# Patient Record
Sex: Male | Born: 1935 | Race: White | Hispanic: No | Marital: Married | Smoking: Never smoker
Health system: Southern US, Community
[De-identification: ages and names within clinical notes are randomized; demographics above are authoritative.]

---

## 2003-04-17 ENCOUNTER — Inpatient Hospital Stay (HOSPITAL_COMMUNITY): Admission: EM | Admit: 2003-04-17 | Discharge: 2003-04-21 | Payer: Self-pay | Admitting: Emergency Medicine

## 2004-03-06 ENCOUNTER — Ambulatory Visit (HOSPITAL_COMMUNITY): Admission: RE | Admit: 2004-03-06 | Discharge: 2004-03-06 | Payer: Self-pay | Admitting: Ophthalmology

## 2005-01-17 ENCOUNTER — Encounter: Admission: RE | Admit: 2005-01-17 | Discharge: 2005-01-17 | Payer: Self-pay | Admitting: Internal Medicine

## 2005-01-18 ENCOUNTER — Encounter (INDEPENDENT_AMBULATORY_CARE_PROVIDER_SITE_OTHER): Payer: Self-pay | Admitting: Specialist

## 2005-01-18 ENCOUNTER — Ambulatory Visit (HOSPITAL_COMMUNITY): Admission: RE | Admit: 2005-01-18 | Discharge: 2005-01-19 | Payer: Self-pay | Admitting: Cardiology

## 2005-01-31 ENCOUNTER — Emergency Department (HOSPITAL_COMMUNITY): Admission: EM | Admit: 2005-01-31 | Discharge: 2005-01-31 | Payer: Self-pay | Admitting: Emergency Medicine

## 2005-11-18 ENCOUNTER — Emergency Department (HOSPITAL_COMMUNITY): Admission: EM | Admit: 2005-11-18 | Discharge: 2005-11-18 | Payer: Self-pay | Admitting: Emergency Medicine

## 2006-05-18 IMAGING — CT CT ANGIO CHEST
2 series · 19 of 32 positions shown · IV contrast (APPLIED)
Comparison: none

CLINICAL DATA: Chest pain.
 CT ANGIOGRAPHY OF CHEST:
TECHNIQUE: Multidetector CT imaging of the chest was performed during bolus injection of intravenous contrast.  Multiplanar CT angiographic image reconstructions were generated to evaluate the vascular anatomy.
 Contrast:  90 cc Omnipaque 300

[Series 4: pulm embolism 2.0 st · axial · 0.69mm/px · z∈[-301,-21]mm · 15 of 162 slices shown]
[im 11/162  lung]
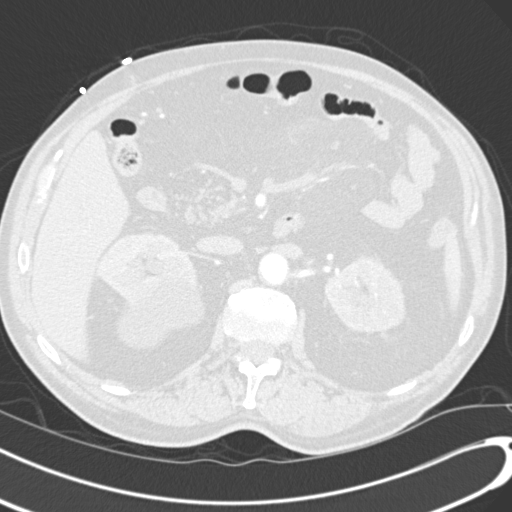
[im 21/162  soft-tissue]
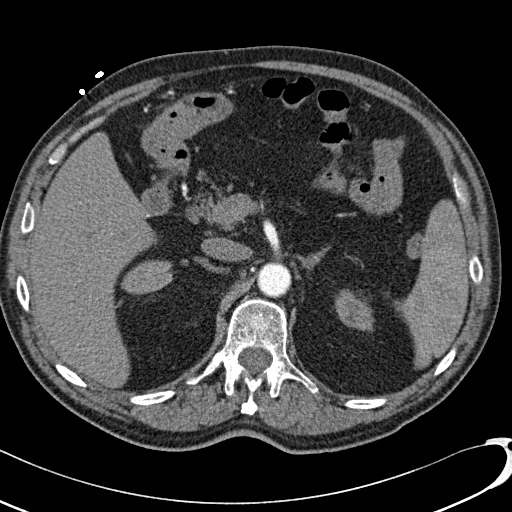
[im 31/162  lung]
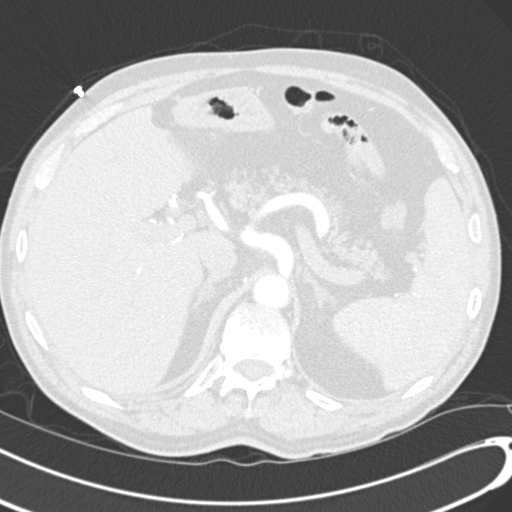
[im 41/162  soft-tissue]
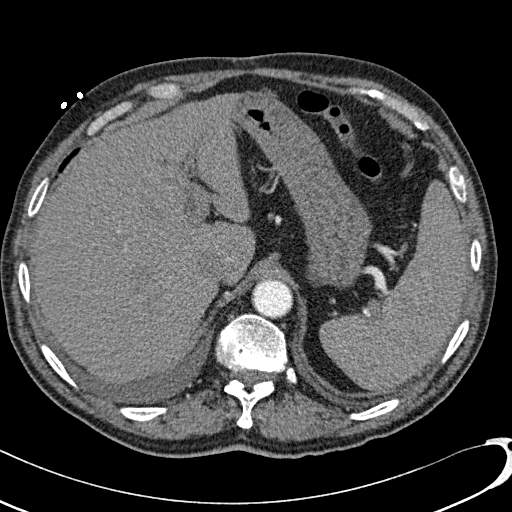
[im 51/162  lung]
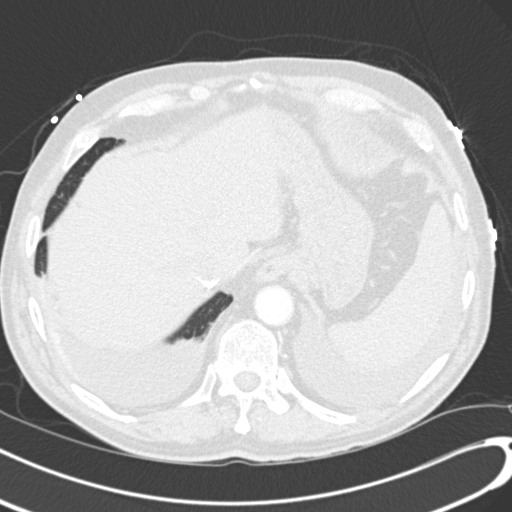
[im 61/162  soft-tissue]
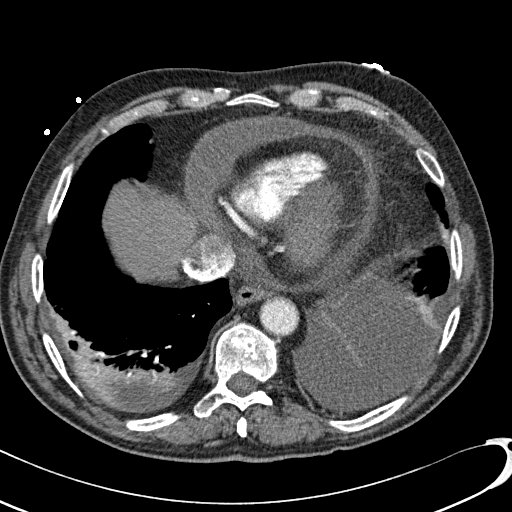
[im 71/162  lung]
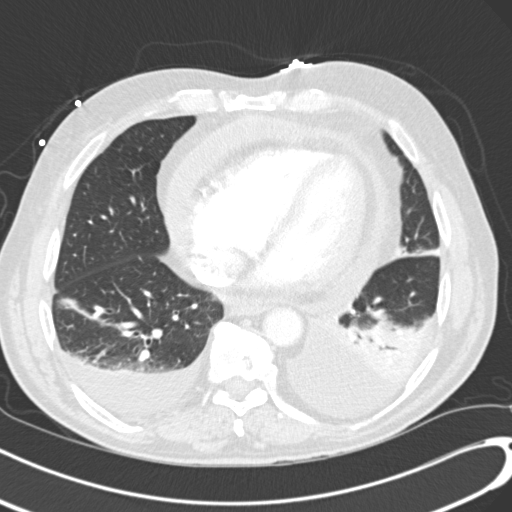
[im 81/162  soft-tissue]
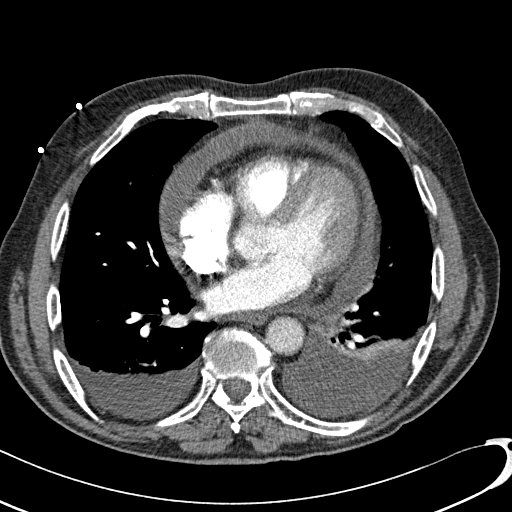
[im 91/162  lung]
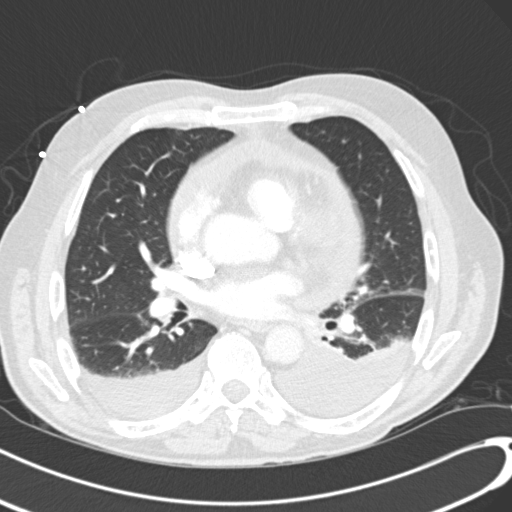
[im 101/162  soft-tissue]
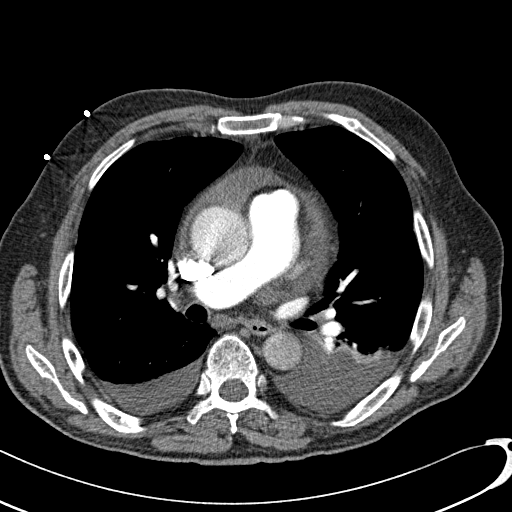
[im 111/162  lung]
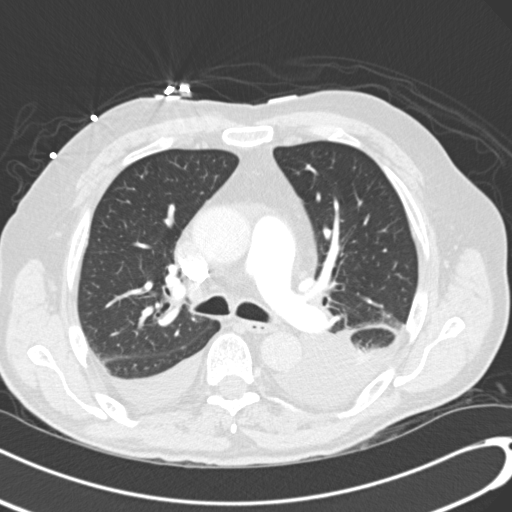
[im 121/162  soft-tissue]
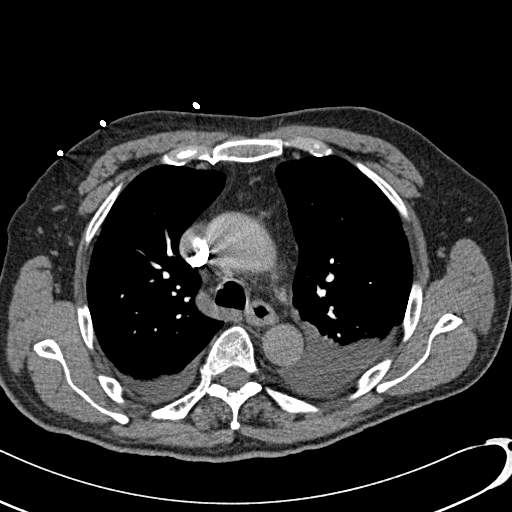
[im 131/162  lung]
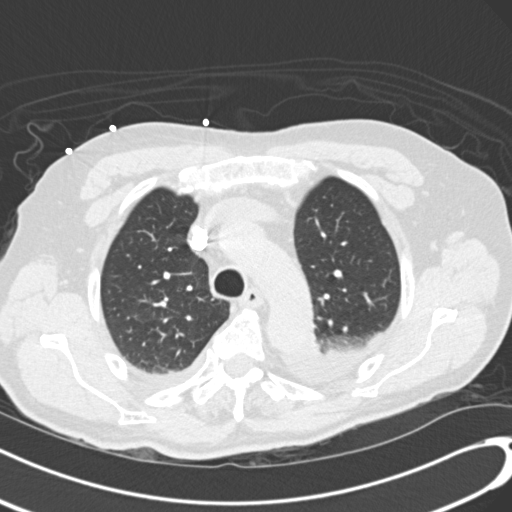
[im 141/162  soft-tissue]
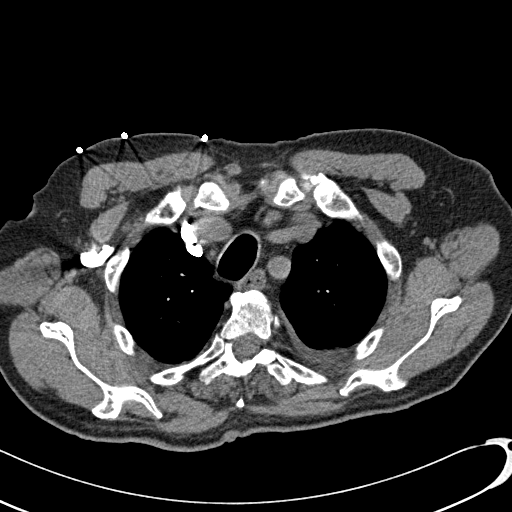
[im 151/162  lung]
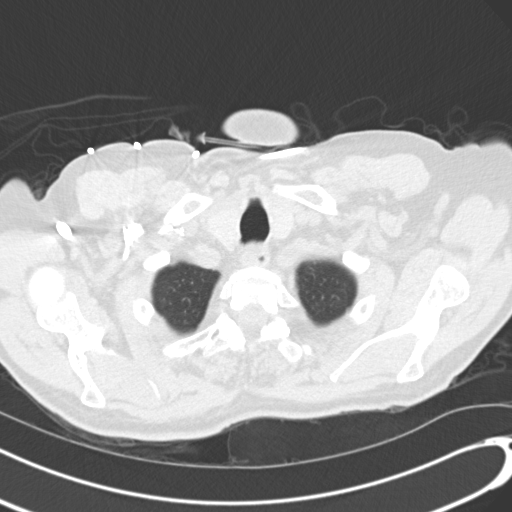

[Series 5: pulm embolism 2.0 lung · axial · 0.69mm/px · z∈[-255,-157]mm · 4 of 138 slices shown]
[im 10/138  soft-tissue]
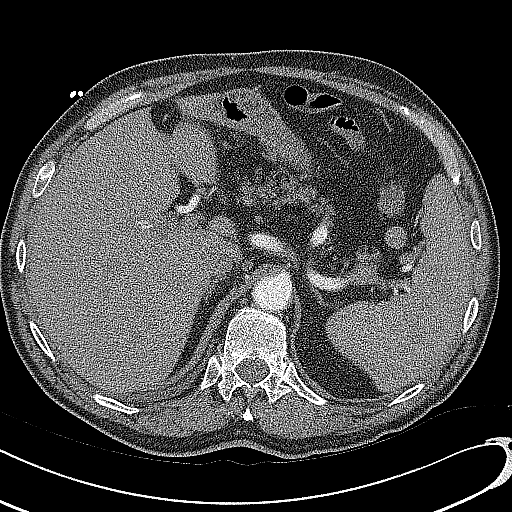
[im 30/138  soft-tissue]
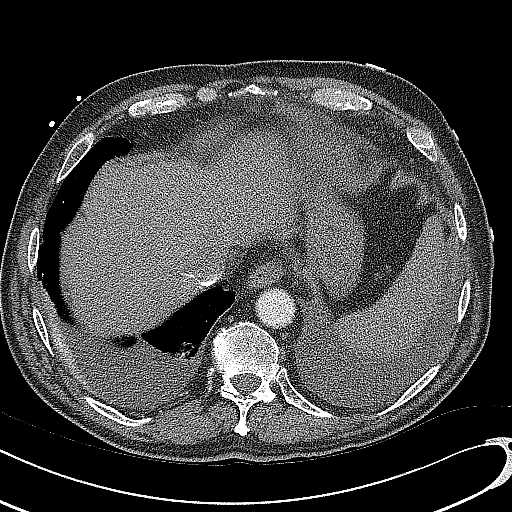
[im 49/138  soft-tissue]
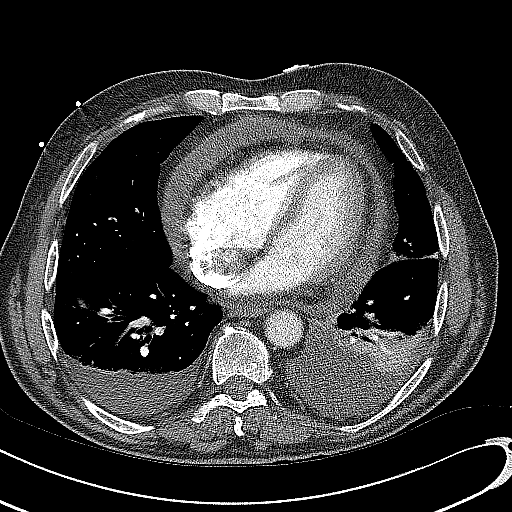
[im 59/138  soft-tissue]
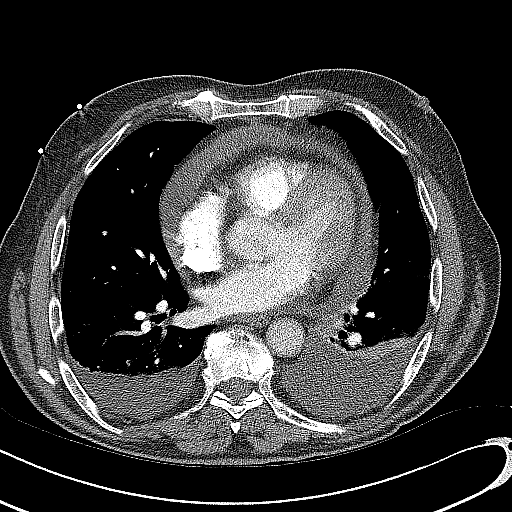

[19 of 32 positions shown; findings below may reference images not displayed]

FINDINGS: No CT evidence of pulmonary embolus.  Main pulmonary artery is slightly enlarged at 3.3 cm. No pathologically enlarged mediastinal, hilar, or axillary lymph nodes.  Esophagus is dilated proximally and is somewhat thick-walled.  Heart size is within normal limits, although there is a large pericardial effusion with enhancement.  Coronary artery calcification is noted.
 There are moderate sized bilateral pleural effusions, left slightly greater than right.  Associated compressive atelectasis is noted.  Lungs are otherwise clear without evidence of edema or pneumonia.  Airway is unremarkable.
 Incidental imaging of the upper abdomen reveals several low attenuation lesions/masses, which are incompletely imaged.  The largest visualized mass on the right contains a focus of high attenuation, possibly representing calcium.  Patient is status post cholecystectomy.  There is a 1.3 cm nodule in the right adrenal gland.  Nonspecific hazy density is seen in the small bowel mesentery.  There are tiny associated soft tissue nodules.
IMPRESSION: 1.  No CT evidence of pulmonary embolus.
 2.  Large pericardial effusion with pericardial enhancement.  Please correlate clinically in patient with history of chest pain.
 3.  Query pulmonary arterial hypertension.
 4.  Bilateral pleural effusions and bibasilar compressive atelectasis.
 5.  Low attenuation lesion/masses within the visualized portions of both kidneys.  These are incompletely imaged.  One of these is at least minimally complex on the right.  Correlation with ultrasound would be useful in further evaluation, as clinically indicated.
 6.  Right adrenal nodule, likely representing an adenoma, given Hounsfield units of 12 or less.
 7.  Nonspecific haziness and minimal nodularity of small bowel mesentery.

## 2006-05-18 IMAGING — CR DG CHEST 2V
2 series · 2 of 2 positions shown · non-contrast
Comparison: none

CLINICAL DATA: Cough and Shortness of breath.  Chest pain.  
 CHEST - 2 VIEW:
 Small pleural effusions are present bilaterally, left-sided larger than right.  Moderate cardiomegaly is noted but there is no evidence of congestive heart failure.  There is no evidence of pulmonary consolidation.  Mild compressive atelectasis is noted at both lung bases.

[w chest pa]
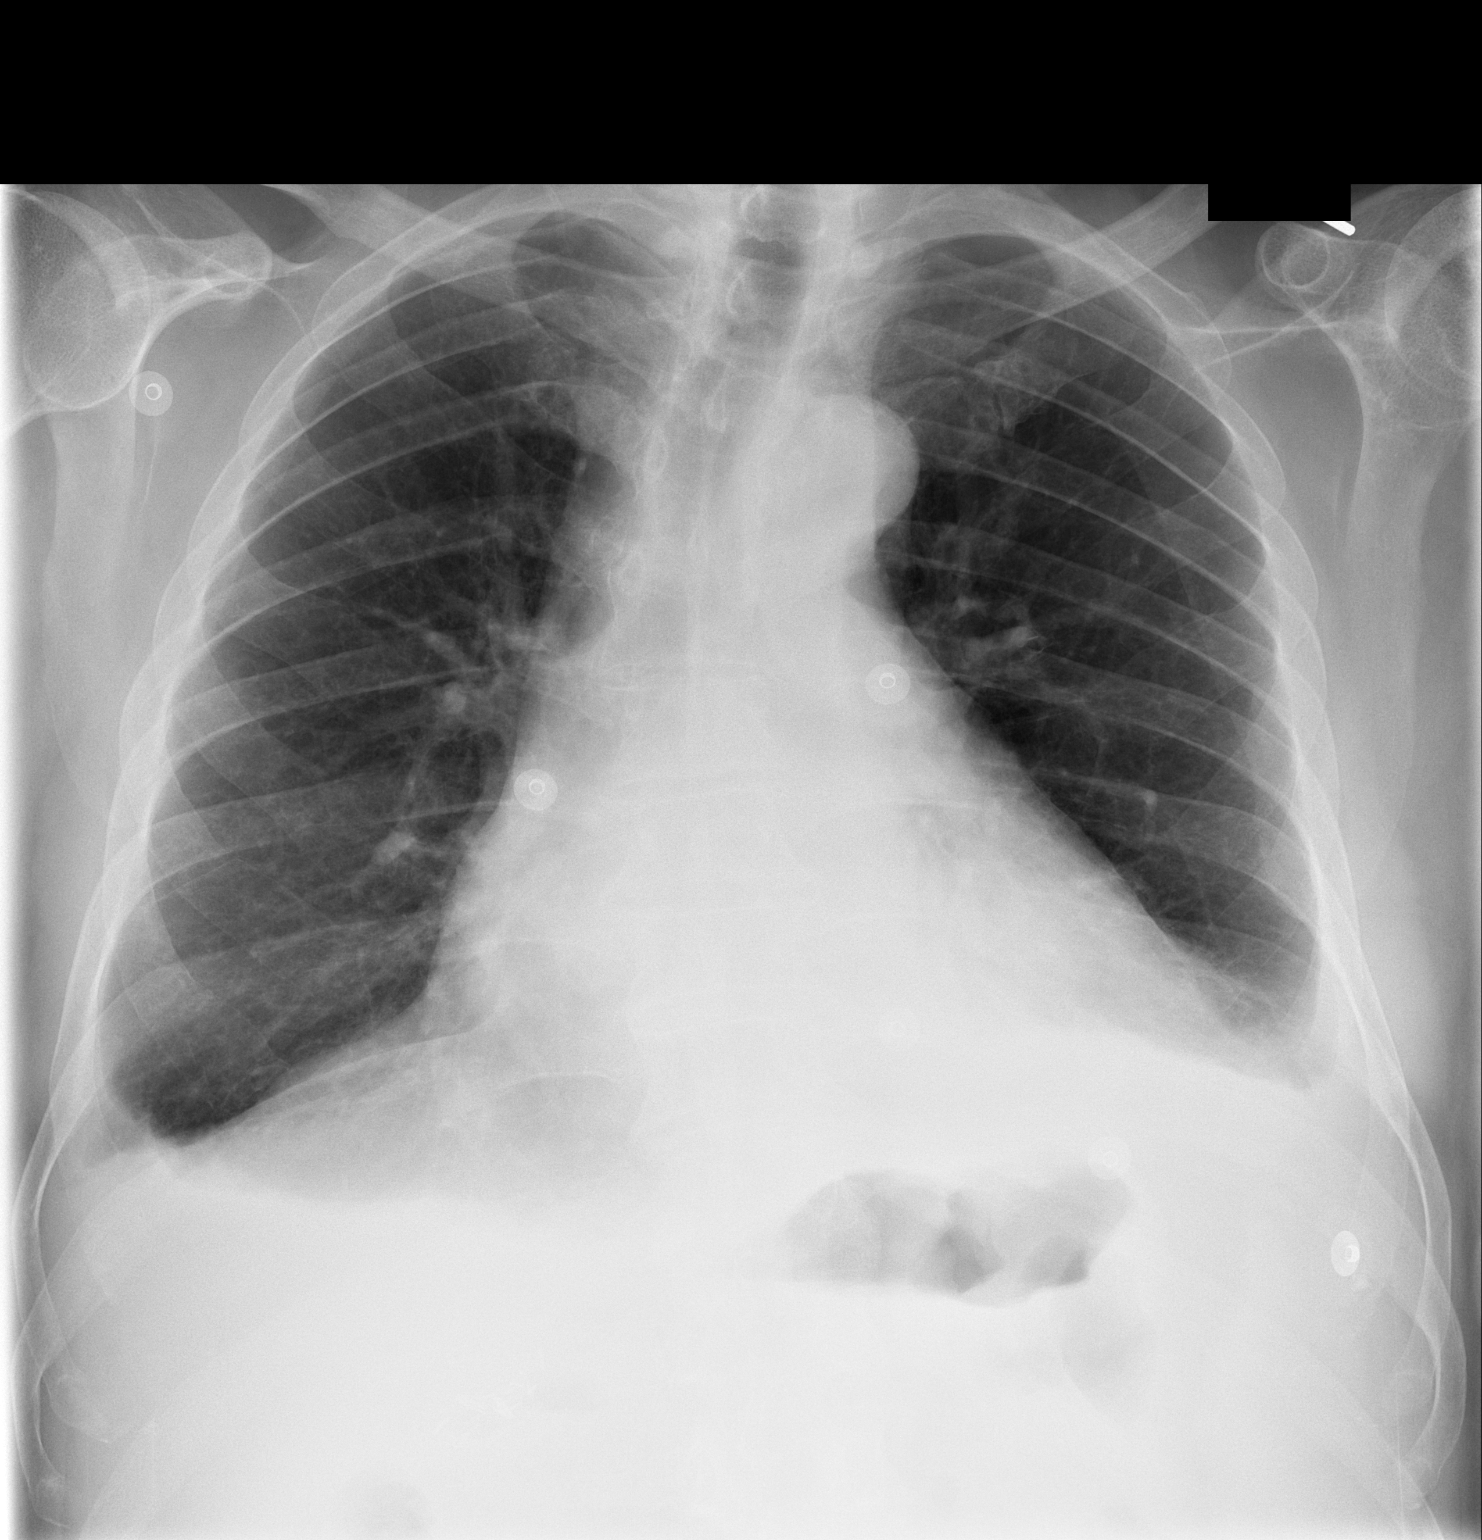

[w chest lat]
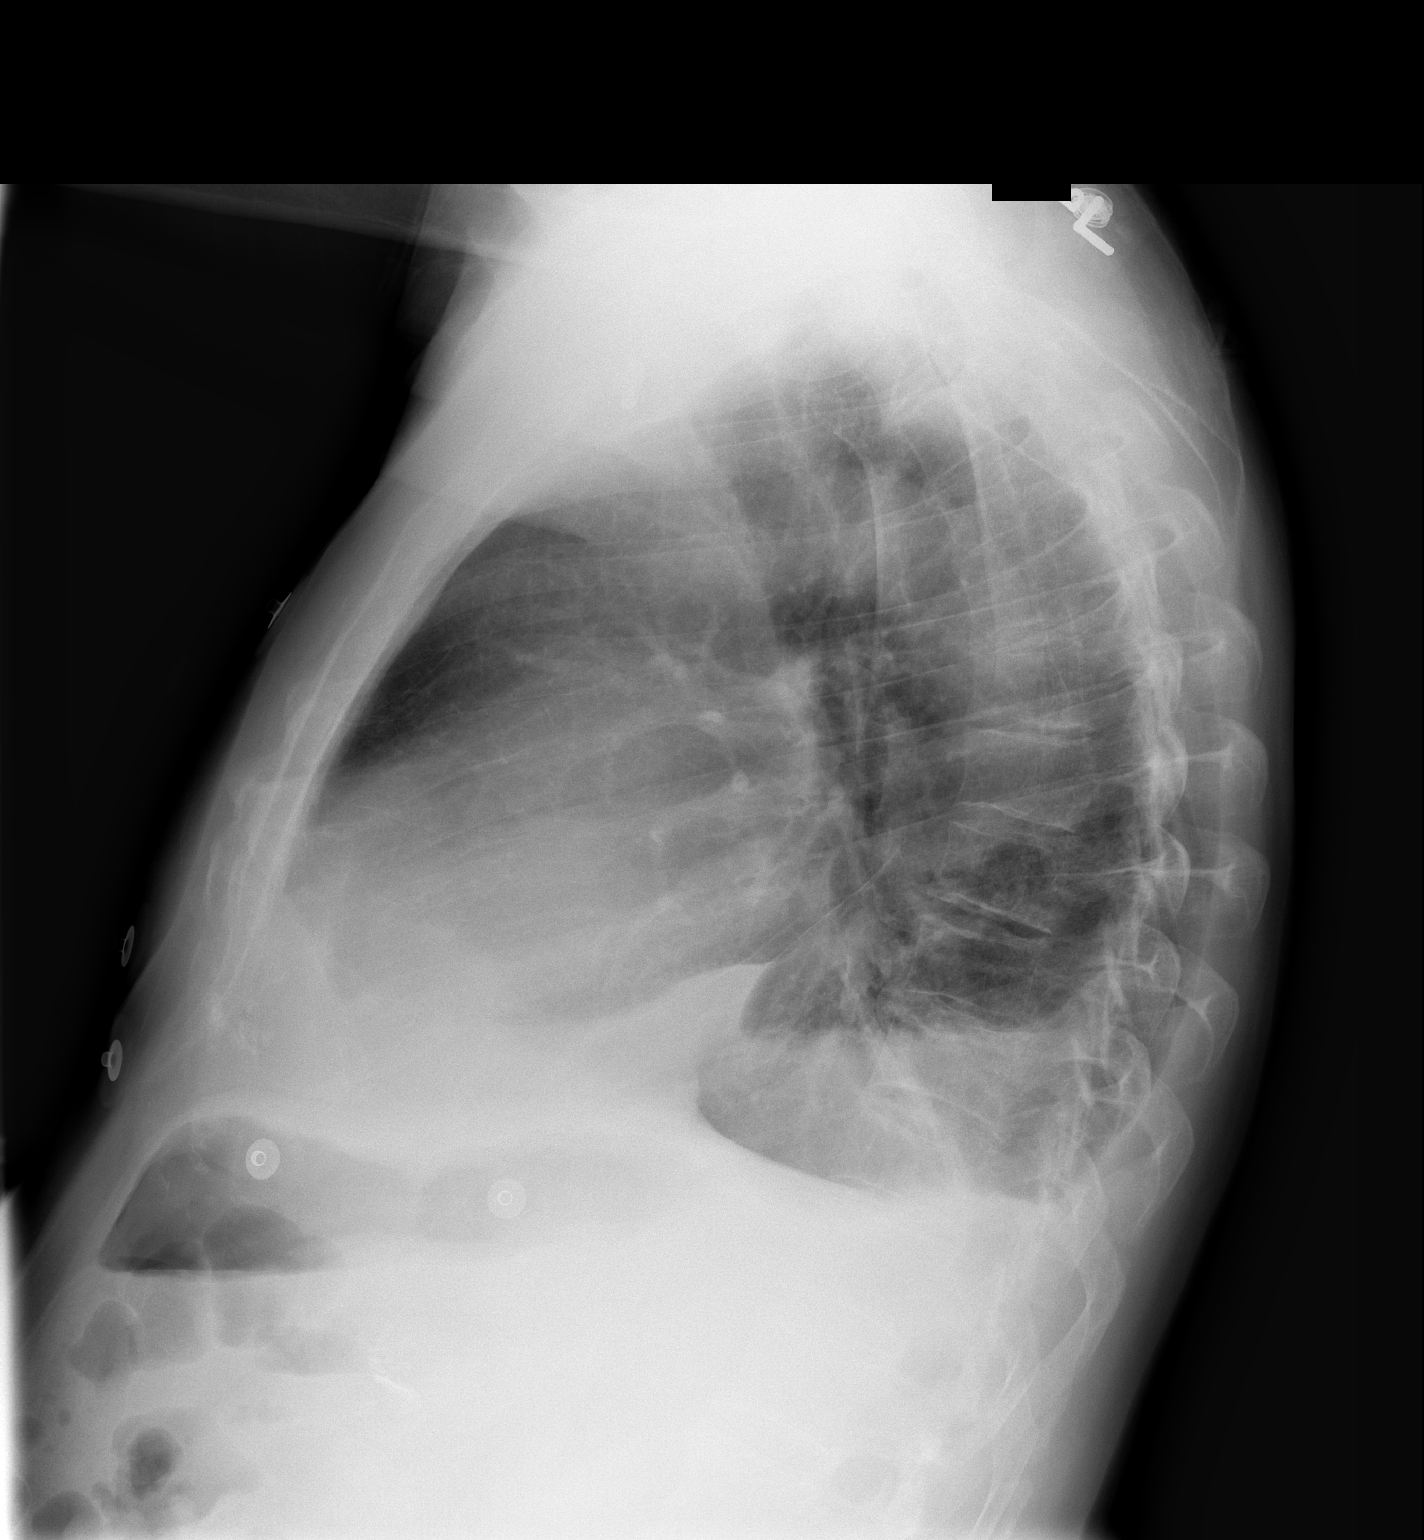

[2 of 2 positions shown; findings below may reference images not displayed]

IMPRESSION: 1.  Small bilateral pleural effusions and mild bibasilar atelectasis. 
 2.  Moderate cardiomegaly.  No evidence of congestive heart failure.

## 2006-12-02 ENCOUNTER — Encounter: Admission: RE | Admit: 2006-12-02 | Discharge: 2006-12-02 | Payer: Self-pay | Admitting: Urology

## 2006-12-04 ENCOUNTER — Ambulatory Visit (HOSPITAL_BASED_OUTPATIENT_CLINIC_OR_DEPARTMENT_OTHER): Admission: RE | Admit: 2006-12-04 | Discharge: 2006-12-04 | Payer: Self-pay | Admitting: Urology

## 2007-04-12 ENCOUNTER — Emergency Department (HOSPITAL_COMMUNITY): Admission: EM | Admit: 2007-04-12 | Discharge: 2007-04-12 | Payer: Self-pay | Admitting: Emergency Medicine

## 2007-04-23 ENCOUNTER — Ambulatory Visit (HOSPITAL_BASED_OUTPATIENT_CLINIC_OR_DEPARTMENT_OTHER): Admission: RE | Admit: 2007-04-23 | Discharge: 2007-04-23 | Payer: Self-pay | Admitting: Urology

## 2008-06-25 ENCOUNTER — Encounter: Admission: RE | Admit: 2008-06-25 | Discharge: 2008-06-25 | Payer: Self-pay | Admitting: Internal Medicine

## 2010-06-18 ENCOUNTER — Encounter: Payer: Self-pay | Admitting: Internal Medicine

## 2010-10-10 NOTE — Op Note (Signed)
NAME:  Anthony Hamilton, Anthony Hamilton         ACCOUNT NO.:  [id number]   MEDICAL RECORD NO.:  [id number]          PATIENT TYPE:  AMB   LOCATION:  [facility]                         FACILITY:  [facility]   PHYSICIAN:  Bernestine Amass, M.D.  DATE OF BIRTH:  15-May-1928   DATE OF PROCEDURE:  12/04/2006  DATE OF DISCHARGE:                               OPERATIVE REPORT   PREOPERATIVE DIAGNOSIS:  Bladder calculus, 3 cm.   POSTOPERATIVE DIAGNOSIS:  Bladder calculus, 3 cm.   PROCEDURE PERFORMED:  Cystoscopy with Holmium laser lithotripsy of  bladder calculus.   SURGEON:  Bernestine Amass, M.D.   ANESTHESIA:  General.   INDICATIONS:  [name] was seen recently because of recurrent  gross hematuria.  He has been known to have nephrolithiasis in the past  and has also had some mild to moderate bladder neck obstruction.  During  his recent assessment, he was noted to have ongoing hematuria without  evidence of infection.  Renal ultrasound showed no hydronephrosis.  The  patient does have a small stone in the lower pole of his left kidney.  Cystoscopy, however, revealed a stone within the bladder.  Initially in  the office, we felt this was about a 2 cm stone.  We felt that this  should be treated and was likely to be the cause for his gross  hematuria.  The patient understood the advantages and disadvantages of  stone treatment of this nature and full informed consent was obtained.   TECHNIQUE AND FINDINGS:  The patient was brought to the operating room  where he had successful induction of general anesthesia.  He was placed  in lithotomy position, prepped and draped in the usual manner.  Cystoscopy revealed unremarkable anterior urethra.  The patient had  fairly significant trilobar hyperplasia with a high riding median bar  and a small middle lobe.  In the base of his bladder, there was a stone  that was larger than we expected.  He had a small 0.5 cm stone and about  a 3 to 3.5 cm stone see at his  trigone.  A 500 micron Holmium laser  lithotriptor fiber was then used.  The stone was quite dense.  We were  able to completely fracture the stone.  All pieces were then evacuated.  The patient had mild hematuria due to some friable vessels at his  bladder neck.  His urine was light pink at the completion of the  procedure.  No other obvious complications occurred.  The patient was  brought to recovery room in stable condition.           ______________________________  Bernestine Amass, M.D.  Electronically Signed     DSG/MEDQ  D:  12/04/2006  T:  12/04/2006  Job:  [id number]

## 2010-10-10 NOTE — Op Note (Signed)
[name], [name]         ACCOUNT NO.:  [id number]   MEDICAL RECORD NO.:  [id number]          PATIENT TYPE:  AMB   LOCATION:  [facility]                         [facility]ACILITY:  [facility]   PHYSICIAN:  Bernestine Amass, M.D.  DATE OF BIRTH:  [birth year]   DATE OF PROCEDURE:  04/23/2007  DATE OF DISCHARGE:                               OPERATIVE REPORT   ATTENDING:  Dr. Rana Snare.   ASSISTANT:  Dr. Posey Pronto.   PREOPERATIVE DIAGNOSIS:  Bilateral uric acid stones, left-sided flank  pain.   POSTOPERATIVE DIAGNOSIS:  Bilateral uric acid stones, left-sided flank  pain.   PROCEDURE:  1. Pancystourethroscopy.  2. Left-sided retrograde pyelogram.  3. Left-sided ureteroscopy with laser lithotripsy.  4. Left-sided stent placement.   INDICATIONS:  Anthony Hamilton is a 77 year old gentleman who  presented to clinic for evaluation. He was found to have bilateral  stones that were uric acid. He was placed on urine alkalinization, but  he continued to have left-sided pain and is here today electively for  management.   PROCEDURE IN DETAIL:  The patient was brought back into the operating  room. He was placed in the dorsal lithotomy position after the  successful induction of general endotracheal anesthetic. All pressure  points were padded appropriately. He was prepped and draped in the usual  sterile fashion and received preoperative antibiotics.   Using a 22-French sheath with a 30-degree lens, pancystourethroscopy was  performed. The patient's urethra appeared normal. There was no evidence  of stricture, tumor, foreign body or any other abnormality. He did have  some trilobar prostatomegaly. On entering the patient's bladder, both  ureteral orifices were seen at the trigone which were slightly distorted  due to his large median lobe. Both ureteral orifices were effluxing  clear urine. The rest of his bladder wall showed evidence of hypertrophy  and some diverticula. He did have some uric  acid stones in the bladder  that were small volume.   At this point, we used an acorn-tip catheter to cannulate the distal  portion of the left ureteral orifice. Retrograde contrast was injected  to opacify the ureter in the upper collecting system. Multiple filling  defects were seen throughout the ureter and the upper collecting system  that were mobile consisting with his history of stone. There was no  evidence of stricture or other filling defects. At this point, a  Glidewire was placed into the left ureter. On attempted placement of the  Glidewire, the back wall of the ureter was performed into the bladder  with a Glidewire. On recognizing this, the Glidewire was removed and  replaced and successfully placed in the upper part of the ureter and  kidney. This wire was secured, and then we used a double lumen catheter  to place another wire up into the upper collecting system under  fluoroscopic guidance.   At this point, we used one wire which as a safety wire which was secured  to the patient's draping. The other wire was used to place an access  sheath up into the upper ureter. This was done under direct vision  fluoroscopically.   We then  used the flexible ureteroscope to cannulate the access sheath  and identified some stones in the ureter. Visualization was difficult  due to blood from wire placement. We did identify some stones in the  ureter and lasered them until the fragments were small. We then advanced  the scope into the renal collecting system and visually inspected all  calices. Again, here visualization was difficult due to clot that was  present. However, we were able to identify some stones and were able to  fragment them successfully. At this point, with most of the large stone  burden fragmented, we removed the flexible scope up to the level of the  access sheath and back the access sheath out under direct vision  ensuring no stones were left in. No large  fragments of stones could be  identified in the ureter.   Removed the access sheath, the working wire and the ureteroscope from  the patient's body. We then used the cystoscope to place a stent in the  left collecting system. Good proximal and distal curls were identified  fluoroscopically. The bladder was then irrigated, and stones were  removed. At this point, we ended the procedure.   Dr. Risa Hamilton was present throughout the entirety of the case.   SPECIMENS:  None.   URINE OUTPUT:  Unrecorded.   DRAINS:  Left double J 8 x 24 ureteral stent.   DISPOSITION:  The patient moved to the PACU for further care. He will go  home with an increase in his Urocit-K to 3 tablets b.i.d., ciprofloxacin  for 3 days, Pyridium and a refill of his oxycodone. We will look forward  to seeing him in the clinic.      [name], MD      Bernestine Amass, M.D.  Electronically Signed    JH/MEDQ  D:  04/23/2007  T:  04/23/2007  Job:  [id number]

## 2010-10-13 NOTE — Op Note (Signed)
NAMEMAYSIN, MOEHRING                     ACCOUNT NO.:  [id number]   MEDICAL RECORD NO.:  [id number]                   PATIENT TYPE:  INP   LOCATION:  0451                                 FACILITY:  [facility]   PHYSICIAN:  Lauretta Grill, M.D.                 DATE OF BIRTH:  17-Dec-1927   DATE OF PROCEDURE:  04/18/2003  DATE OF DISCHARGE:                                 OPERATIVE REPORT   PREOPERATIVE DIAGNOSIS:  Right ankle Pott's-type bimalleolar fracture with  deltoid ligament tear and distal fibular fracture.   POSTOPERATIVE DIAGNOSIS:  Right ankle Pott's-type bimalleolar fracture with  deltoid ligament tear and distal fibular fracture.   PROCEDURE:  1. Open reduction, internal fixation of Pott's-type bimalleolar fracture     with open fixation of medial and deltoid ligament with Mitek anchors and     lateral screw and plate.  2. Intraoperative fluoroscopy.   SURGEON:  Lynden Ang, M.D.   ASSISTANT:  Thomasenia Bottoms, P.A.-C.   ANESTHESIA:  General endotracheal.   CULTURES:  None.   DRAINS:  None.   ESTIMATED BLOOD LOSS:  Minimal.   TOURNIQUET TIME:  1 hour and 30 minutes.   PATHOLOGIC FINDINGS AND HISTORY:  Mr. Flach, yesterday, slipped over a  dog bed, sustaining this closed fracture.  He was admitted and splinted.  His medial malleolus was intact, but his deltoid was torn off the talar  side, and the anterior deltoid ligament was completely separated.  He had a  low distal fibular fracture.  There was no evidence of syndesmosis  disruption at final closure with stressing.  We did find that the deltoid  repaired nicely with two Mitek anchors into the talus, then through and  through pants-over-vest, the anterior medial deltoid.  Laterally, anatomic  reduction is obtained with two anterior-posterior interfragmentary lag  screws and a 7-hold DCP plate with the last two holes as buttress with  cortical screws with 8 good cortices proximal.  C-arm fluoroscopy  confirmed  excellent position and alignment.   DESCRIPTION OF PROCEDURE:  With adequate anesthesia obtained using  endotracheal technique, 1 g Ancef given IV prophylaxis, the patient was  placed in the supine position, and the right foot was prepped from the toes  to the knee in a standard fashion.  After standard prepping and draping,  Esmarch exsanguination was used.  The tourniquet was let up to 350 mmHg.  We  first then made a skin incision longitudinal over the medial malleolus,  incision deepened and sharpened with the knife and hemostasis obtained using  the Bovie electrocoagulator.  Hematoma was evacuated and the ligament tear  assessed.  We then thoroughly irrigated the medial joint and flushed it out  with saline.  I then placed two Mitek super anchors in the medial talus  where the medial malleolus had been evulsed.  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  This has  been evaluated preoperatively by CT scan.  This was due to unusual  appearance of the talus and calcaneus in relationship to one another.  In  any case, we got an excellent repair of the anteromedial deltoid which was  ripped off mostly the talus side.  Using pants-over-vest, the proximal  ligament as well as the tuft that it had been pulled off from, and sutured  through in the anterior medial capsule to reef that up.  Two #2 Ethibond  sutures were used in the Mitek anchors, and the talus was held reduced to  the medial malleolus while it was closed down with these horizontal  anchoring #2 Ethibond sutures.  We then reinforced with #1 Vicryl  interrupted figure-of-eights.  We then turned our attention laterally while  a longitudinal incision was made over the distal fibula.  Incision deepened  sharply with the knife and hemostasis obtained using the Bovie  electrocoagulator.  Dissection was carried down to the fracture which was  evacuated of clot and then  reduced with the bone-holding clamp, small  fragment type.  We then placed two oblique anterior-posterior inner  fragmentary lag screws with classic technique.  I then affixed a 7-hole DCP  plate, small fragment, slightly contoured, and placed one screw hole  unfilled over the fracture site, two distal screw holes unicortical with  cancellous screws with a good bite and a good cortices proximally with four  screws.  C-arm fluoroscopy confirmed positioning on mortise AP and lateral  views.  Irrigation was carried out.  The wound was then closed in layers  with subcutaneous 2-0 and 3-0 Vicryl and skin staples.  A bulky, sterile  compressive dressing was applied with a Cam walker boot in neutral position,  and the tourniquet was let down.  The patient, then having tolerated the  procedure well was awakened and taken to the recovery room in satisfactory  condition for routine postoperative care and elevation.                                               Lauretta Grill, M.D.    VEP/MEDQ  D:  04/18/2003  T:  04/18/2003  Job:  [id number]

## 2010-10-13 NOTE — Discharge Summary (Signed)
NAMEALEXIEL, SIDDALL                     ACCOUNT NO.:  [id number]   MEDICAL RECORD NO.:  [id number]                   PATIENT TYPE:  INP   LOCATION:  0451                                 [facility]ACILITY:  [facility]   PHYSICIAN:  Lauretta Grill, M.D.                 DATE OF BIRTH:  [birth year]   DATE OF ADMISSION:  04/17/2003  DATE OF DISCHARGE:  04/21/2003                                 DISCHARGE SUMMARY   ADMISSION DIAGNOSIS:  Bimalleolar fracture right ankle.   DISCHARGE DIAGNOSIS:  Bimalleolar fracture right ankle status post open  reduction internal fixation.   SUMMARY:  The patient presented to [facility] Emergency Room on April 17, 2003 via EMS.  He suffered a fall, tripped at home, and was found to  have a bimalleolar Potts type right ankle fracture.  He was admitted  overnight for observation and pain control.  He was made n.p.o.  He was  taken to the operating room on the morning of November 21.  His ankle had an  open reduction internal fixation with repair of medial deltoid ligament  under general anesthesia with a tourniquet time of one hour and 30 minutes.  Minimal blood loss.  The wound was closed without drains and he was taken to  recovery room in stable condition.  He was planned to be admitted for three  to four days postoperative for elevation and control of swelling.  On  April 19, 2003 postoperative day one he had a moderate of pain that was  well controlled with p.o. medications and occasional IV Dilaudid.  He was  taking p.o. without difficulty and his Foley was still in place.  His  platelets were stable.  He was afebrile.  He had a maximum temperature of  100.3 but it was down to 97.7 in the morning.  Dressing was clean and dry  and he was neurovascularly intact.  Postoperative laboratories showed a  hemoglobin 11.7, hematocrit 33.8 down from 41.4 preoperatively.  Serum  chemistries normal except for a low sodium of 131, elevated glucose of 126,  and a low  serum calcium of 7.7.  We are going to continue him on elevation,  pain medications as needed, ice to the ankle, discontinuing the Foley,  discontinuing the IV, and starting physical therapy on the following day.  We are anticipating discharging him home in a couple of days.  He was out of  bed to chair at that time.  PT, OT, care management were consulted.  On  November 23, postoperative day two he was without complaints.  His pain was  well controlled.  Taking p.o. without difficulty.  His vitals were stable.  He was afebrile.  The dressing was clean and dry.  He was neurovascularly  intact.  He still had a moderate amount of swelling over the ankle and  forefoot and capillary refill less than two seconds in all toes.  His home  DME was ordered as per PT and OT.  On postoperative day three, November 24,  his pain was well controlled, taking p.o., and voiding without difficulty.  His Foley had been removed.  He had no nausea and states he was ready to go  home.  His vital signs were stable.  He was afebrile.  There was one  apparent fracture blister anterior edge of the medial wound that had  decompressed, mild bit of erythema, and slight serosanguineous drainage both  medial and lateral, but no active drainage at the time.  He had some  ecchymosis over the ankle.  The lateral wound was benign.  He was  neurovascularly intact.  He had a moderate amount of swelling in the  forefoot.  Plan was made to discharge him home.  His IV was discontinued.   ACTIVITY:  Nonweightbearing on the right foot.  Elevate the right leg with  two to three pillows.  Ice to the ankle.  He was bed to chair with the  rolling walker.   DIET:  As tolerated.   WOUND CARE:  Keep it clean and dry.  Keep it in the boot until his  postoperative visit.   SPECIAL INSTRUCTIONS:  Call for increasing pain, drainage, or bleeding,  numbness or tingling in his toes, fever greater than 100.5, chills,  coughing, or worsening  shortness of breath.   DISCHARGE MEDICATIONS:  1. Hydrochlorothiazide 12.5 mg one daily that he came in on.  2. In addition, we wrote him Percocet 325/5 mg one q.4-6h., increase to a     maximum of two q.6h. as needed #40 with no refills.  3. Robaxin 750 mg one p.o. q.6-8h. p.r.n. spasm or cramp #40 plus two     refills.   FOLLOWUP:  Follow up in the office on Monday, November 29 with V. Hiram Comber, M.D. at 0930.   CONDITION ON DISCHARGE:  Stable.     Thomasenia Bottoms, P.A.-C.                       Lauretta Grill, M.D.    AC/MEDQ  D:  05/06/2003  T:  05/06/2003  Job:  [id number]

## 2010-10-13 NOTE — H&P (Signed)
Anthony Hamilton, Anthony Hamilton         ACCOUNT NO.:  [id number]   MEDICAL RECORD NO.:  [id number]          PATIENT TYPE:  OIB   LOCATION:  2856                         FACILITY:  [facility]   PHYSICIAN:  Barnett Abu, M.D.  DATE OF BIRTH:  [birth year]   DATE OF ADMISSION:  01/18/2005  DATE OF DISCHARGE:                                HISTORY & PHYSICAL   PRIMARY CARE PHYSICIAN:  Dwaine Deter, M.D.   CARDIOLOGIST:  Barnett Abu, M.D.   CHIEF COMPLAINT:  Pericardial effusion.   HISTORY OF PRESENT ILLNESS:  Anthony Hamilton is 77 year old male patient with  prior history of hypertension and dyslipidemia.  He has been followed by Dr.  Inda Merlin, as well as by Dr. Sharlet Salina.  He was evaluated in the primary  care office on January 12, 2005 for complaints of chest congestion, sore  throat, and a nonproductive cough.  He also reported a recent long distance  travel.  Routine work-up included a CT of the chest.  This did reveal  bilateral pleural effusions, left greater than right, as well as an  incidental finding of a very large pericardial effusion.  He was sent to  [facility] on January 17, 2005 for an echocardiogram.  This did  redemonstrate the pericardial effusion about 2 cm in size.  This was read by  Dr. Fabio Asa.  The patient was stable from a cardiac and respiratory  standpoint, but it was felt that the patient would benefit from  pericardiocentesis on an outpatient basis as soon as possible.  The patient  has been relatively asymptomatic, essentially only short of breath when  walking up the stairs.  On the day of the echocardiogram, he had actually  exercised on his treadmill at home without any pulmonary symptoms.  Dr.  Inda Merlin also walked the patient up and down the stairs with minimal  reproduction of shortness of breath at that time.  Since last being seen by  physicians yesterday afternoon, the patient states he was awakened about  1:30 this morning with  paroxysmal coughing, non-productive, but he stated he  felt less congested today total hip arthroplasty in the past few days.  He  notices that he is more short of breath after he has a coughing spell.  Again, the patient is scheduled to have a pericardiocentesis in the cath lab  today per Dr. Rodell Perna.   REVIEW OF SYSTEMS:  Essentially noncontributory.  Please see history of  present illness.  He has had no fevers, chills, or myalgias.  No abdominal  pain, no reflux.  No difficulty going to the bathroom.  No dark or bloody  stools.  No hematuria.  No lower extremity swelling.  No lower extremity  pain with ambulation.  No chest pain, no dizziness, no orthopnea, no  tachypalpitations.   PAST MEDICAL HISTORY:  1.  Hypertension.  2.  Dyslipidemia.  3.  Elevated PSA with BPH.  The patient refuses prostate exam.  4.  Meniere's disease.  5.  Colon cancer with a history of prior solitary polyp.   SOCIAL HISTORY:  He has never smoked.  He drinks  wine, 3 ounces 3 times per  week, usually on spaghetti night.  He has been married for 48 years.  He is  retired from the [employer] and was trained as a  Nature conservation officer.   FAMILY MEDICAL HISTORY:  His brother is healthy.  His father died at an old  age after a fall.  His father was blind, and this contributed to his fall.  His mother died at age 59 of problems related to a CVA.   ALLERGIES:  IBUPROFEN, which causes a rash.   MEDICATIONS:  1.  Hydrochlorothiazide 25 mg one-half daily.  2.  Multiple vitamins over-the-counter with specificity to ocular      enhancement.   PHYSICAL EXAMINATION:  GENERAL:  He is a pleasant male, currently in no  acute distress, without complaints of shortness of breath.  VITAL SIGNS:  Temperature 97.1, blood pressure 131/77, heart rate 85,  respirations 18, height 5 feet, 8 inches, weight 168 pounds.  NEUROLOGIC:  The patient is alert and oriented x3, moving all extremities  x4.   No focal neurologic deficits.  DTRs were not checked.  HEENT:  Head is normocephalic.  Sclerae are not injected.  Oral mucous  membranes pink and moist.  NECK:  Supple without adenopathy.  CHEST:  Bilateral lung sounds are clear, but decreased at the bases  bilaterally with faint crackles.  He is saturating 98% on room air.  He is  currently non-labored and non-tachypneic with speaking.  CARDIAC:  Heart sounds are S1 and S2.  No rubs, murmurs, thrills, or  gallops.  No JVD.  Carotids are 2+ bilaterally.  No bruits.  ABDOMEN:  Soft, nontender, nondistended, without hepatosplenomegaly, masses,  or bruits.  EXTREMITIES:  Symmetrical in appearance without edema, cyanosis, or  clubbing.  Pulses are palpable at 1+ bilaterally, radial, femoral, and  pedal.   LABORATORY DATA:  Sodium 140, potassium 3.4, BUN 23, creatinine 1.3.  LFT's  normal.  White count 7900, hemoglobin 13.2, platelets 206,000, neutrophils  77%, lymphocytes 12%.  D-dimer is mildly elevated; high normal is 0.4, the  patient;s D-dimer is 0.7.  Diagnostics and EKG done at Dr. Inda Merlin office show  sinus rhythm, ventricular rate of 83, QTC of 432 ms.  Nondiagnostic small Q  waves in leads III and aVF.  Otherwise, negative.  CT of the chest shows no  PE, a very large pericardial effusion, and bilateral effusions, left greater  than right.  2-D echocardiogram - the actual report is not available for  definitive pericardial effusion measurements.   IMPRESSION:  1.  Large pericardial effusion.  2.  Mild hypokalemia.  3.  Bilateral pleural effusions.  4.  Hypertension.   PLAN:  The patient is set to undergo pericardiocentesis on an elective basis  per Dr. Rodell Perna today in the cath lab.  Risks and benefits of this  procedure have been discussed with the patient per Dr. Leonia Reeves, and he  wishes to proceed.  It is important to note the patient is clinically stable with minimal shortness of breath.  Vital signs are stable, and there  is no  pericardial rub heard on exam.   The patient does have mild hypokalemia, probably due to his  hydrochlorothiazide.  If the patient stays overnight, will need to repeat a  BMET in the morning to follow potassium.  He may need regular potassium  supplementation while on hydrochlorothiazide.   The patient is tolerating bilateral pleural effusions well without hypoxia.  Additional work-up and  follow up per Dr. Inda Merlin.   Blood pressure is again well controlled on hydrochlorothiazide.  Will  continue this medication post procedure.      Salida Joycelyn Man      Barnett Abu, M.D.  Electronically Signed    ALE/MEDQ  D:  01/18/2005  T:  01/18/2005  Job:  [id number]   cc:   Dwaine Deter, M.D.  [address]  [place] [address]  Fax: [phone]   Sharlet Salina, M.D.  Fax: [phone]

## 2010-10-13 NOTE — Cardiovascular Report (Signed)
NAME:  Anthony Hamilton, Anthony Hamilton         ACCOUNT NO.:  [id number]   MEDICAL RECORD NO.:  [id number]          PATIENT TYPE:  OIB   LOCATION:  2856                         FACILITY:  [facility]   PHYSICIAN:  Barnett Abu, M.D.  DATE OF BIRTH:  1928-05-26   DATE OF PROCEDURE:  DATE OF DISCHARGE:                              CARDIAC CATHETERIZATION   PROCEDURE PERFORMED:  Percutaneous pericardiocentesis   INDICATIONS:  Mr. Bonomo is a 77 year old man who recently underwent a  CT scan to rule out pulmonary embolus because of chest discomfort. It  revealed small bilateral pleural effusions and a large pericardial effusion.  The latter was confirmed by echocardiography. This is circumferential and  least 2 cm in diameter.  The patient is now brought to catheterization  laboratory for a diagnostic pericardiocentesis.   PROCEDURE NOTE:  The patient is brought to the cardiac catheterization  laboratory in the fasting state. The anterior chest and subxiphoid region  were prepped and draped in the usual sterile fashion. The patient received  IV sedation consisting of 2 mg of Versed and  50 mcg of fentanyl. After  local anesthesia of the subxiphoid region, an 18-gauge pericardiocentesis  needle was advanced slowly while repeatedly aspirating and injecting  lidocaine at a 45-degree angulation and pointed midway between the left  shoulder and the sternal notch. Eventually the pericardium was entered  without difficulty. The needle was monitored electrocardiographically. There  were no ST-segment changes. A  0.038 inch J-wire was then advanced through  the needle and the needle was removed. An 8 and then a 9-French dilator were  used to enlarge the passage in the pericardium and finally an 8-French  pericardial catheter was placed posteriorly without difficulty. Then, 20 mL  of serosanguineous fluid was withdrawn and sent to the laboratory for  analysis. The catheter was then attached to a vacuum bottle  and at the time  of this dictation, the bottle has evacuated approximately 700 mL. The  catheter was sutured into place. The patient is transported to the recovery  area in stable condition.   FINAL IMPRESSION:  Successful elective cardioversion, large amount  serosanguineous fluid.      Barnett Abu, M.D.  Electronically Signed     JHE/MEDQ  D:  01/18/2005  T:  01/19/2005  Job:  [id number]   cc:   Hal T. Stoneking, M.D.  [address] [place], [place] [address]  Fax: [phone]

## 2010-10-13 NOTE — Op Note (Signed)
NAME:  Anthony Hamilton, Anthony Hamilton         ACCOUNT NO.:  [id number]   MEDICAL RECORD NO.:  [id number]          PATIENT TYPE:  OIB   LOCATION:  2550                         FACILITY:  [facility]   PHYSICIAN:  Clent Demark. Rankin, M.D.   DATE OF BIRTH:  December 10, 1927   DATE OF PROCEDURE:  03/06/2004  DATE OF DISCHARGE:  03/06/2004                                 OPERATIVE REPORT   PREOPERATIVE DIAGNOSIS:  Epiretinal membrane, right eye.   POSTOPERATIVE DIAGNOSIS:  Epiretinal membrane, right eye.   OPERATION PERFORMED:  Posterior vitrectomy and membrane peel, epiretinal  membrane and internal limiting membrane, right eye.   SURGEON:  Clent Demark. Rankin, M.D.   ANESTHESIA:  Local retrobulbar with monitored anesthesia control.   INDICATIONS FOR PROCEDURE:  The patient is a 77 year old man who has  profound vision loss on the basis of epiretinal membranes.  This is an  attempt to release and remove topographical distortion on the basis of the  epiretinal membrane.  The patient understands the risks of anesthesia  including the rare occurrence of death but also to the eye including but not  limited to hemorrhage, infection, scarring, need for another surgery, no  change in vision, loss of vision and progression of disease despite  intervention.  After appropriate signed consent was obtained, the patient  was taken to the operating room.   DESCRIPTION OF PROCEDURE:  In the operating room, appropriate monitoring was  followed by mild sedation.  0.75% Marcaine delivered 5 mL retrobulbar  followed by an additional 5 mL in fashion of modified Kirk Ruths.  The right  periocular region was sterilely prepped and draped in the usual ophthalmic  fashion.  A lid speculum was applied.  Conjunctival peritomy was not  necessary because a 25 gauge system was used.  Trocar was placed initially  in the infratemporal quadrant.  Placement in the vitreous cavity verified  visually prior to using the infusion.  Superior trocars  were then placed.  Core vitrectomy was then begun with a 25 gauge system.  PVD had occurred  spontaneously.  25 gauge forceps were then used to remove the epiretinal  membrane and epiretinal as well as internal limiting membrane tissue was  removed from the entire macular and perimacular region.  Less topographical  distortion was noted.  No complications occurred.  No holes formed.  Peripheral vitreous was then trimmed.  Peripheral retina was inspected and  found to be free of holes or tears.  No complications occurred.  Under low  infusion pressure the superior trocars were then removed and compression  used at each of the trocar sites.  Thereafter the infusion was briefly  turned off and the infusion trocar removed.  Topical Maxitrol ointment was  applied.  The patient tolerated the procedure well without complication.  He  was taken to the short stay area to be discharged home as an outpatient.     GAR/MEDQ  D:  03/06/2004  T:  03/07/2004  Job:  [id number]

## 2011-01-16 ENCOUNTER — Other Ambulatory Visit: Payer: Self-pay | Admitting: Gastroenterology

## 2011-03-06 LAB — URINALYSIS, ROUTINE W REFLEX MICROSCOPIC
Bilirubin Urine: NEGATIVE
Glucose, UA: NEGATIVE
Ketones, ur: 40 — AB
Leukocytes, UA: NEGATIVE
Nitrite: NEGATIVE
Protein, ur: NEGATIVE
Specific Gravity, Urine: 1.018
Urobilinogen, UA: 0.2
pH: 6

## 2011-03-06 LAB — I-STAT 8, (EC8 V) (CONVERTED LAB)
Acid-base deficit: 1
BUN: 29 — ABNORMAL HIGH
BUN: 50 — ABNORMAL HIGH
Bicarbonate: 22.1
Bicarbonate: 24.9 — ABNORMAL HIGH
Chloride: 107
Chloride: 107
Glucose, Bld: 140 — ABNORMAL HIGH
Glucose, Bld: 98
HCT: 39
HCT: 44
Hemoglobin: 13.3
Hemoglobin: 15
Operator id: 257131
Operator id: 268271
Potassium: 3.9
Potassium: 4.4
Sodium: 137
Sodium: 142
TCO2: 23
TCO2: 26
pCO2, Ven: 32.7 — ABNORMAL LOW
pCO2, Ven: 41 — ABNORMAL LOW
pH, Ven: 7.391 — ABNORMAL HIGH
pH, Ven: 7.439 — ABNORMAL HIGH

## 2011-03-06 LAB — URINE MICROSCOPIC-ADD ON

## 2011-03-06 LAB — POCT I-STAT CREATININE
Creatinine, Ser: 1.9 — ABNORMAL HIGH
Operator id: 257131

## 2011-03-13 LAB — POCT I-STAT 4, (NA,K, GLUC, HGB,HCT)
Glucose, Bld: 101 — ABNORMAL HIGH
HCT: 41
Hemoglobin: 13.9
Operator id: 114531
Potassium: 3.8
Sodium: 141

## 2011-08-09 DIAGNOSIS — H35369 Drusen (degenerative) of macula, unspecified eye: Secondary | ICD-10-CM | POA: Diagnosis not present

## 2011-08-09 DIAGNOSIS — H353190 Nonexudative age-related macular degeneration, unspecified eye, stage unspecified: Secondary | ICD-10-CM | POA: Diagnosis not present

## 2011-08-09 DIAGNOSIS — D3130 Benign neoplasm of unspecified choroid: Secondary | ICD-10-CM | POA: Diagnosis not present

## 2011-08-09 DIAGNOSIS — H35379 Puckering of macula, unspecified eye: Secondary | ICD-10-CM | POA: Diagnosis not present

## 2011-09-11 DIAGNOSIS — E782 Mixed hyperlipidemia: Secondary | ICD-10-CM | POA: Diagnosis not present

## 2011-09-11 DIAGNOSIS — Z0000 Encounter for general adult medical examination without abnormal findings: Secondary | ICD-10-CM | POA: Diagnosis not present

## 2011-09-11 DIAGNOSIS — N400 Enlarged prostate without lower urinary tract symptoms: Secondary | ICD-10-CM | POA: Diagnosis not present

## 2011-09-11 DIAGNOSIS — I10 Essential (primary) hypertension: Secondary | ICD-10-CM | POA: Diagnosis not present

## 2011-09-11 DIAGNOSIS — N182 Chronic kidney disease, stage 2 (mild): Secondary | ICD-10-CM | POA: Diagnosis not present

## 2011-09-20 DIAGNOSIS — H26499 Other secondary cataract, unspecified eye: Secondary | ICD-10-CM | POA: Diagnosis not present

## 2011-09-20 DIAGNOSIS — H353190 Nonexudative age-related macular degeneration, unspecified eye, stage unspecified: Secondary | ICD-10-CM | POA: Diagnosis not present

## 2011-09-20 DIAGNOSIS — Z961 Presence of intraocular lens: Secondary | ICD-10-CM | POA: Diagnosis not present

## 2011-09-20 DIAGNOSIS — H04129 Dry eye syndrome of unspecified lacrimal gland: Secondary | ICD-10-CM | POA: Diagnosis not present

## 2011-09-20 DIAGNOSIS — H40019 Open angle with borderline findings, low risk, unspecified eye: Secondary | ICD-10-CM | POA: Diagnosis not present

## 2011-10-29 DIAGNOSIS — H268 Other specified cataract: Secondary | ICD-10-CM | POA: Diagnosis not present

## 2011-10-29 DIAGNOSIS — H04129 Dry eye syndrome of unspecified lacrimal gland: Secondary | ICD-10-CM | POA: Diagnosis not present

## 2011-10-29 DIAGNOSIS — H02839 Dermatochalasis of unspecified eye, unspecified eyelid: Secondary | ICD-10-CM | POA: Diagnosis not present

## 2011-10-29 DIAGNOSIS — H40029 Open angle with borderline findings, high risk, unspecified eye: Secondary | ICD-10-CM | POA: Diagnosis not present

## 2012-01-21 DIAGNOSIS — H6120 Impacted cerumen, unspecified ear: Secondary | ICD-10-CM | POA: Diagnosis not present

## 2012-03-11 DIAGNOSIS — I10 Essential (primary) hypertension: Secondary | ICD-10-CM | POA: Diagnosis not present

## 2012-03-11 DIAGNOSIS — Z23 Encounter for immunization: Secondary | ICD-10-CM | POA: Diagnosis not present

## 2012-05-01 DIAGNOSIS — N401 Enlarged prostate with lower urinary tract symptoms: Secondary | ICD-10-CM | POA: Diagnosis not present

## 2012-05-01 DIAGNOSIS — N200 Calculus of kidney: Secondary | ICD-10-CM | POA: Diagnosis not present

## 2012-05-08 DIAGNOSIS — H353190 Nonexudative age-related macular degeneration, unspecified eye, stage unspecified: Secondary | ICD-10-CM | POA: Diagnosis not present

## 2012-05-08 DIAGNOSIS — H35369 Drusen (degenerative) of macula, unspecified eye: Secondary | ICD-10-CM | POA: Diagnosis not present

## 2012-05-08 DIAGNOSIS — D3130 Benign neoplasm of unspecified choroid: Secondary | ICD-10-CM | POA: Diagnosis not present

## 2012-08-28 DIAGNOSIS — D3130 Benign neoplasm of unspecified choroid: Secondary | ICD-10-CM | POA: Diagnosis not present

## 2012-08-28 DIAGNOSIS — H268 Other specified cataract: Secondary | ICD-10-CM | POA: Diagnosis not present

## 2012-08-28 DIAGNOSIS — H353190 Nonexudative age-related macular degeneration, unspecified eye, stage unspecified: Secondary | ICD-10-CM | POA: Diagnosis not present

## 2012-08-28 DIAGNOSIS — H401490 Capsular glaucoma with pseudoexfoliation of lens, unspecified eye, stage unspecified: Secondary | ICD-10-CM | POA: Diagnosis not present

## 2012-08-28 DIAGNOSIS — Z961 Presence of intraocular lens: Secondary | ICD-10-CM | POA: Diagnosis not present

## 2012-08-28 DIAGNOSIS — H40029 Open angle with borderline findings, high risk, unspecified eye: Secondary | ICD-10-CM | POA: Diagnosis not present

## 2012-09-11 DIAGNOSIS — I10 Essential (primary) hypertension: Secondary | ICD-10-CM | POA: Diagnosis not present

## 2012-09-11 DIAGNOSIS — E782 Mixed hyperlipidemia: Secondary | ICD-10-CM | POA: Diagnosis not present

## 2012-09-11 DIAGNOSIS — Z0000 Encounter for general adult medical examination without abnormal findings: Secondary | ICD-10-CM | POA: Diagnosis not present

## 2012-09-16 DIAGNOSIS — I10 Essential (primary) hypertension: Secondary | ICD-10-CM | POA: Diagnosis not present

## 2012-09-16 DIAGNOSIS — E782 Mixed hyperlipidemia: Secondary | ICD-10-CM | POA: Diagnosis not present

## 2012-11-13 DIAGNOSIS — H268 Other specified cataract: Secondary | ICD-10-CM | POA: Diagnosis not present

## 2012-11-13 DIAGNOSIS — H40029 Open angle with borderline findings, high risk, unspecified eye: Secondary | ICD-10-CM | POA: Diagnosis not present

## 2012-11-13 DIAGNOSIS — H04129 Dry eye syndrome of unspecified lacrimal gland: Secondary | ICD-10-CM | POA: Diagnosis not present

## 2013-02-10 DIAGNOSIS — H353190 Nonexudative age-related macular degeneration, unspecified eye, stage unspecified: Secondary | ICD-10-CM | POA: Diagnosis not present

## 2013-02-10 DIAGNOSIS — H35379 Puckering of macula, unspecified eye: Secondary | ICD-10-CM | POA: Diagnosis not present

## 2013-02-10 DIAGNOSIS — H35369 Drusen (degenerative) of macula, unspecified eye: Secondary | ICD-10-CM | POA: Diagnosis not present

## 2013-03-13 DIAGNOSIS — Z23 Encounter for immunization: Secondary | ICD-10-CM | POA: Diagnosis not present

## 2013-03-13 DIAGNOSIS — I10 Essential (primary) hypertension: Secondary | ICD-10-CM | POA: Diagnosis not present

## 2013-09-08 DIAGNOSIS — H353190 Nonexudative age-related macular degeneration, unspecified eye, stage unspecified: Secondary | ICD-10-CM | POA: Diagnosis not present

## 2013-09-08 DIAGNOSIS — H26499 Other secondary cataract, unspecified eye: Secondary | ICD-10-CM | POA: Diagnosis not present

## 2013-09-08 DIAGNOSIS — H40029 Open angle with borderline findings, high risk, unspecified eye: Secondary | ICD-10-CM | POA: Diagnosis not present

## 2013-09-08 DIAGNOSIS — H35379 Puckering of macula, unspecified eye: Secondary | ICD-10-CM | POA: Diagnosis not present

## 2013-09-08 DIAGNOSIS — Z961 Presence of intraocular lens: Secondary | ICD-10-CM | POA: Diagnosis not present

## 2013-09-29 DIAGNOSIS — N183 Chronic kidney disease, stage 3 unspecified: Secondary | ICD-10-CM | POA: Diagnosis not present

## 2013-09-29 DIAGNOSIS — E782 Mixed hyperlipidemia: Secondary | ICD-10-CM | POA: Diagnosis not present

## 2013-09-29 DIAGNOSIS — Z0000 Encounter for general adult medical examination without abnormal findings: Secondary | ICD-10-CM | POA: Diagnosis not present

## 2013-09-29 DIAGNOSIS — I10 Essential (primary) hypertension: Secondary | ICD-10-CM | POA: Diagnosis not present

## 2013-10-09 DIAGNOSIS — R799 Abnormal finding of blood chemistry, unspecified: Secondary | ICD-10-CM | POA: Diagnosis not present

## 2013-12-31 DIAGNOSIS — D3130 Benign neoplasm of unspecified choroid: Secondary | ICD-10-CM | POA: Diagnosis not present

## 2013-12-31 DIAGNOSIS — H353190 Nonexudative age-related macular degeneration, unspecified eye, stage unspecified: Secondary | ICD-10-CM | POA: Diagnosis not present

## 2013-12-31 DIAGNOSIS — H35369 Drusen (degenerative) of macula, unspecified eye: Secondary | ICD-10-CM | POA: Diagnosis not present

## 2014-04-06 DIAGNOSIS — Z23 Encounter for immunization: Secondary | ICD-10-CM | POA: Diagnosis not present

## 2014-04-06 DIAGNOSIS — N183 Chronic kidney disease, stage 3 (moderate): Secondary | ICD-10-CM | POA: Diagnosis not present

## 2014-04-06 DIAGNOSIS — I10 Essential (primary) hypertension: Secondary | ICD-10-CM | POA: Diagnosis not present

## 2014-05-14 DIAGNOSIS — R351 Nocturia: Secondary | ICD-10-CM | POA: Diagnosis not present

## 2014-05-14 DIAGNOSIS — N401 Enlarged prostate with lower urinary tract symptoms: Secondary | ICD-10-CM | POA: Diagnosis not present

## 2014-09-15 DIAGNOSIS — H35359 Cystoid macular degeneration, unspecified eye: Secondary | ICD-10-CM | POA: Diagnosis not present

## 2014-09-15 DIAGNOSIS — H5319 Other subjective visual disturbances: Secondary | ICD-10-CM | POA: Diagnosis not present

## 2014-09-15 DIAGNOSIS — H3531 Nonexudative age-related macular degeneration: Secondary | ICD-10-CM | POA: Diagnosis not present

## 2014-09-15 DIAGNOSIS — H40023 Open angle with borderline findings, high risk, bilateral: Secondary | ICD-10-CM | POA: Diagnosis not present

## 2014-09-15 DIAGNOSIS — D3131 Benign neoplasm of right choroid: Secondary | ICD-10-CM | POA: Diagnosis not present

## 2014-10-05 DIAGNOSIS — Z0000 Encounter for general adult medical examination without abnormal findings: Secondary | ICD-10-CM | POA: Diagnosis not present

## 2014-10-05 DIAGNOSIS — I10 Essential (primary) hypertension: Secondary | ICD-10-CM | POA: Diagnosis not present

## 2014-10-05 DIAGNOSIS — Z1389 Encounter for screening for other disorder: Secondary | ICD-10-CM | POA: Diagnosis not present

## 2014-10-05 DIAGNOSIS — N183 Chronic kidney disease, stage 3 (moderate): Secondary | ICD-10-CM | POA: Diagnosis not present

## 2014-10-05 DIAGNOSIS — E782 Mixed hyperlipidemia: Secondary | ICD-10-CM | POA: Diagnosis not present

## 2014-12-30 DIAGNOSIS — H3531 Nonexudative age-related macular degeneration: Secondary | ICD-10-CM | POA: Diagnosis not present

## 2014-12-30 DIAGNOSIS — H35363 Drusen (degenerative) of macula, bilateral: Secondary | ICD-10-CM | POA: Diagnosis not present

## 2014-12-30 DIAGNOSIS — D3131 Benign neoplasm of right choroid: Secondary | ICD-10-CM | POA: Diagnosis not present

## 2014-12-30 DIAGNOSIS — H35371 Puckering of macula, right eye: Secondary | ICD-10-CM | POA: Diagnosis not present

## 2015-01-06 DIAGNOSIS — H40023 Open angle with borderline findings, high risk, bilateral: Secondary | ICD-10-CM | POA: Diagnosis not present

## 2015-04-05 DIAGNOSIS — N183 Chronic kidney disease, stage 3 (moderate): Secondary | ICD-10-CM | POA: Diagnosis not present

## 2015-04-05 DIAGNOSIS — Z23 Encounter for immunization: Secondary | ICD-10-CM | POA: Diagnosis not present

## 2015-04-05 DIAGNOSIS — E782 Mixed hyperlipidemia: Secondary | ICD-10-CM | POA: Diagnosis not present

## 2015-04-05 DIAGNOSIS — R21 Rash and other nonspecific skin eruption: Secondary | ICD-10-CM | POA: Diagnosis not present

## 2015-04-05 DIAGNOSIS — I10 Essential (primary) hypertension: Secondary | ICD-10-CM | POA: Diagnosis not present

## 2015-05-03 DIAGNOSIS — H40023 Open angle with borderline findings, high risk, bilateral: Secondary | ICD-10-CM | POA: Diagnosis not present

## 2015-05-13 DIAGNOSIS — R351 Nocturia: Secondary | ICD-10-CM | POA: Diagnosis not present

## 2015-05-13 DIAGNOSIS — N401 Enlarged prostate with lower urinary tract symptoms: Secondary | ICD-10-CM | POA: Diagnosis not present

## 2015-09-19 DIAGNOSIS — H353110 Nonexudative age-related macular degeneration, right eye, stage unspecified: Secondary | ICD-10-CM | POA: Diagnosis not present

## 2015-09-19 DIAGNOSIS — H40023 Open angle with borderline findings, high risk, bilateral: Secondary | ICD-10-CM | POA: Diagnosis not present

## 2015-09-19 DIAGNOSIS — H353122 Nonexudative age-related macular degeneration, left eye, intermediate dry stage: Secondary | ICD-10-CM | POA: Diagnosis not present

## 2015-09-19 DIAGNOSIS — H353120 Nonexudative age-related macular degeneration, left eye, stage unspecified: Secondary | ICD-10-CM | POA: Diagnosis not present

## 2015-09-19 DIAGNOSIS — Z961 Presence of intraocular lens: Secondary | ICD-10-CM | POA: Diagnosis not present

## 2015-09-19 DIAGNOSIS — H353112 Nonexudative age-related macular degeneration, right eye, intermediate dry stage: Secondary | ICD-10-CM | POA: Diagnosis not present

## 2015-10-12 DIAGNOSIS — N183 Chronic kidney disease, stage 3 (moderate): Secondary | ICD-10-CM | POA: Diagnosis not present

## 2015-10-12 DIAGNOSIS — Z0000 Encounter for general adult medical examination without abnormal findings: Secondary | ICD-10-CM | POA: Diagnosis not present

## 2015-10-12 DIAGNOSIS — Z1389 Encounter for screening for other disorder: Secondary | ICD-10-CM | POA: Diagnosis not present

## 2015-10-12 DIAGNOSIS — I10 Essential (primary) hypertension: Secondary | ICD-10-CM | POA: Diagnosis not present

## 2015-10-12 DIAGNOSIS — E7800 Pure hypercholesterolemia, unspecified: Secondary | ICD-10-CM | POA: Diagnosis not present

## 2016-03-08 DIAGNOSIS — H40023 Open angle with borderline findings, high risk, bilateral: Secondary | ICD-10-CM | POA: Diagnosis not present

## 2016-03-30 DIAGNOSIS — R21 Rash and other nonspecific skin eruption: Secondary | ICD-10-CM | POA: Diagnosis not present

## 2016-03-30 DIAGNOSIS — Z23 Encounter for immunization: Secondary | ICD-10-CM | POA: Diagnosis not present

## 2016-04-13 DIAGNOSIS — E7800 Pure hypercholesterolemia, unspecified: Secondary | ICD-10-CM | POA: Diagnosis not present

## 2016-04-13 DIAGNOSIS — I10 Essential (primary) hypertension: Secondary | ICD-10-CM | POA: Diagnosis not present

## 2016-04-13 DIAGNOSIS — N183 Chronic kidney disease, stage 3 (moderate): Secondary | ICD-10-CM | POA: Diagnosis not present

## 2016-05-18 DIAGNOSIS — Z87442 Personal history of urinary calculi: Secondary | ICD-10-CM | POA: Diagnosis not present

## 2016-05-18 DIAGNOSIS — N400 Enlarged prostate without lower urinary tract symptoms: Secondary | ICD-10-CM | POA: Diagnosis not present

## 2016-09-20 DIAGNOSIS — H35033 Hypertensive retinopathy, bilateral: Secondary | ICD-10-CM | POA: Diagnosis not present

## 2016-09-20 DIAGNOSIS — H40023 Open angle with borderline findings, high risk, bilateral: Secondary | ICD-10-CM | POA: Diagnosis not present

## 2016-09-20 DIAGNOSIS — Z961 Presence of intraocular lens: Secondary | ICD-10-CM | POA: Diagnosis not present

## 2016-09-20 DIAGNOSIS — D3131 Benign neoplasm of right choroid: Secondary | ICD-10-CM | POA: Diagnosis not present

## 2016-09-20 DIAGNOSIS — H353132 Nonexudative age-related macular degeneration, bilateral, intermediate dry stage: Secondary | ICD-10-CM | POA: Diagnosis not present

## 2016-11-08 DIAGNOSIS — E7800 Pure hypercholesterolemia, unspecified: Secondary | ICD-10-CM | POA: Diagnosis not present

## 2016-11-08 DIAGNOSIS — I10 Essential (primary) hypertension: Secondary | ICD-10-CM | POA: Diagnosis not present

## 2016-11-08 DIAGNOSIS — N183 Chronic kidney disease, stage 3 (moderate): Secondary | ICD-10-CM | POA: Diagnosis not present

## 2016-11-08 DIAGNOSIS — Z0000 Encounter for general adult medical examination without abnormal findings: Secondary | ICD-10-CM | POA: Diagnosis not present

## 2016-11-08 DIAGNOSIS — Z1389 Encounter for screening for other disorder: Secondary | ICD-10-CM | POA: Diagnosis not present

## 2017-03-25 DIAGNOSIS — H04123 Dry eye syndrome of bilateral lacrimal glands: Secondary | ICD-10-CM | POA: Diagnosis not present

## 2017-03-25 DIAGNOSIS — H2589 Other age-related cataract: Secondary | ICD-10-CM | POA: Diagnosis not present

## 2017-03-25 DIAGNOSIS — H40023 Open angle with borderline findings, high risk, bilateral: Secondary | ICD-10-CM | POA: Diagnosis not present

## 2017-03-25 DIAGNOSIS — H40053 Ocular hypertension, bilateral: Secondary | ICD-10-CM | POA: Diagnosis not present

## 2017-04-15 DIAGNOSIS — H40022 Open angle with borderline findings, high risk, left eye: Secondary | ICD-10-CM | POA: Diagnosis not present

## 2017-04-29 DIAGNOSIS — H40021 Open angle with borderline findings, high risk, right eye: Secondary | ICD-10-CM | POA: Diagnosis not present

## 2017-05-17 DIAGNOSIS — E7800 Pure hypercholesterolemia, unspecified: Secondary | ICD-10-CM | POA: Diagnosis not present

## 2017-05-17 DIAGNOSIS — I10 Essential (primary) hypertension: Secondary | ICD-10-CM | POA: Diagnosis not present

## 2017-05-17 DIAGNOSIS — N183 Chronic kidney disease, stage 3 (moderate): Secondary | ICD-10-CM | POA: Diagnosis not present

## 2017-05-17 DIAGNOSIS — Z23 Encounter for immunization: Secondary | ICD-10-CM | POA: Diagnosis not present

## 2017-06-10 DIAGNOSIS — H40023 Open angle with borderline findings, high risk, bilateral: Secondary | ICD-10-CM | POA: Diagnosis not present

## 2017-06-10 DIAGNOSIS — H40053 Ocular hypertension, bilateral: Secondary | ICD-10-CM | POA: Diagnosis not present

## 2017-06-10 DIAGNOSIS — H2589 Other age-related cataract: Secondary | ICD-10-CM | POA: Diagnosis not present

## 2017-06-10 DIAGNOSIS — H04123 Dry eye syndrome of bilateral lacrimal glands: Secondary | ICD-10-CM | POA: Diagnosis not present

## 2017-09-25 DIAGNOSIS — H353132 Nonexudative age-related macular degeneration, bilateral, intermediate dry stage: Secondary | ICD-10-CM | POA: Diagnosis not present

## 2017-09-25 DIAGNOSIS — H40023 Open angle with borderline findings, high risk, bilateral: Secondary | ICD-10-CM | POA: Diagnosis not present

## 2017-09-25 DIAGNOSIS — H35033 Hypertensive retinopathy, bilateral: Secondary | ICD-10-CM | POA: Diagnosis not present

## 2017-09-25 DIAGNOSIS — H35371 Puckering of macula, right eye: Secondary | ICD-10-CM | POA: Diagnosis not present

## 2017-11-14 DIAGNOSIS — I10 Essential (primary) hypertension: Secondary | ICD-10-CM | POA: Diagnosis not present

## 2017-11-14 DIAGNOSIS — E7800 Pure hypercholesterolemia, unspecified: Secondary | ICD-10-CM | POA: Diagnosis not present

## 2017-11-14 DIAGNOSIS — Z0000 Encounter for general adult medical examination without abnormal findings: Secondary | ICD-10-CM | POA: Diagnosis not present

## 2017-11-14 DIAGNOSIS — Z1389 Encounter for screening for other disorder: Secondary | ICD-10-CM | POA: Diagnosis not present

## 2017-11-14 DIAGNOSIS — N183 Chronic kidney disease, stage 3 (moderate): Secondary | ICD-10-CM | POA: Diagnosis not present

## 2018-03-06 DIAGNOSIS — H40023 Open angle with borderline findings, high risk, bilateral: Secondary | ICD-10-CM | POA: Diagnosis not present

## 2018-03-06 DIAGNOSIS — H02833 Dermatochalasis of right eye, unspecified eyelid: Secondary | ICD-10-CM | POA: Diagnosis not present

## 2018-03-06 DIAGNOSIS — H2589 Other age-related cataract: Secondary | ICD-10-CM | POA: Diagnosis not present

## 2018-03-06 DIAGNOSIS — H04123 Dry eye syndrome of bilateral lacrimal glands: Secondary | ICD-10-CM | POA: Diagnosis not present

## 2018-05-13 DIAGNOSIS — N183 Chronic kidney disease, stage 3 (moderate): Secondary | ICD-10-CM | POA: Diagnosis not present

## 2018-05-13 DIAGNOSIS — Z23 Encounter for immunization: Secondary | ICD-10-CM | POA: Diagnosis not present

## 2018-05-13 DIAGNOSIS — R600 Localized edema: Secondary | ICD-10-CM | POA: Diagnosis not present

## 2018-05-13 DIAGNOSIS — I10 Essential (primary) hypertension: Secondary | ICD-10-CM | POA: Diagnosis not present

## 2018-05-13 DIAGNOSIS — E7800 Pure hypercholesterolemia, unspecified: Secondary | ICD-10-CM | POA: Diagnosis not present

## 2018-11-08 ENCOUNTER — Inpatient Hospital Stay (HOSPITAL_COMMUNITY)
Admission: EM | Admit: 2018-11-08 | Discharge: 2018-11-17 | DRG: 470 | Disposition: A | Discharge status: Skilled Nursing Facility | Payer: Medicare Other | Attending: Internal Medicine | Admitting: Internal Medicine

## 2018-11-08 ENCOUNTER — Emergency Department (HOSPITAL_COMMUNITY): Payer: Medicare Other

## 2018-11-08 ENCOUNTER — Encounter (HOSPITAL_COMMUNITY): Payer: Self-pay | Admitting: Emergency Medicine

## 2018-11-08 ENCOUNTER — Other Ambulatory Visit: Payer: Self-pay

## 2018-11-08 DIAGNOSIS — Z7982 Long term (current) use of aspirin: Secondary | ICD-10-CM | POA: Diagnosis not present

## 2018-11-08 DIAGNOSIS — R413 Other amnesia: Secondary | ICD-10-CM | POA: Diagnosis present

## 2018-11-08 DIAGNOSIS — S299XXA Unspecified injury of thorax, initial encounter: Secondary | ICD-10-CM | POA: Diagnosis not present

## 2018-11-08 DIAGNOSIS — N183 Chronic kidney disease, stage 3 (moderate): Secondary | ICD-10-CM | POA: Diagnosis not present

## 2018-11-08 DIAGNOSIS — R000 Tachycardia, unspecified: Secondary | ICD-10-CM | POA: Diagnosis not present

## 2018-11-08 DIAGNOSIS — Z66 Do not resuscitate: Secondary | ICD-10-CM | POA: Diagnosis present

## 2018-11-08 DIAGNOSIS — M6281 Muscle weakness (generalized): Secondary | ICD-10-CM | POA: Diagnosis not present

## 2018-11-08 DIAGNOSIS — I129 Hypertensive chronic kidney disease with stage 1 through stage 4 chronic kidney disease, or unspecified chronic kidney disease: Secondary | ICD-10-CM | POA: Diagnosis present

## 2018-11-08 DIAGNOSIS — R918 Other nonspecific abnormal finding of lung field: Secondary | ICD-10-CM | POA: Diagnosis not present

## 2018-11-08 DIAGNOSIS — Y92009 Unspecified place in unspecified non-institutional (private) residence as the place of occurrence of the external cause: Secondary | ICD-10-CM

## 2018-11-08 DIAGNOSIS — Y92239 Unspecified place in hospital as the place of occurrence of the external cause: Secondary | ICD-10-CM | POA: Diagnosis not present

## 2018-11-08 DIAGNOSIS — E870 Hyperosmolality and hypernatremia: Secondary | ICD-10-CM | POA: Diagnosis present

## 2018-11-08 DIAGNOSIS — Z7401 Bed confinement status: Secondary | ICD-10-CM | POA: Diagnosis not present

## 2018-11-08 DIAGNOSIS — D72829 Elevated white blood cell count, unspecified: Secondary | ICD-10-CM | POA: Diagnosis present

## 2018-11-08 DIAGNOSIS — Z87442 Personal history of urinary calculi: Secondary | ICD-10-CM | POA: Diagnosis not present

## 2018-11-08 DIAGNOSIS — R4189 Other symptoms and signs involving cognitive functions and awareness: Secondary | ICD-10-CM | POA: Diagnosis present

## 2018-11-08 DIAGNOSIS — R52 Pain, unspecified: Secondary | ICD-10-CM | POA: Diagnosis not present

## 2018-11-08 DIAGNOSIS — W1830XA Fall on same level, unspecified, initial encounter: Secondary | ICD-10-CM | POA: Diagnosis present

## 2018-11-08 DIAGNOSIS — F0390 Unspecified dementia without behavioral disturbance: Secondary | ICD-10-CM | POA: Diagnosis not present

## 2018-11-08 DIAGNOSIS — R4182 Altered mental status, unspecified: Secondary | ICD-10-CM | POA: Diagnosis not present

## 2018-11-08 DIAGNOSIS — T43505A Adverse effect of unspecified antipsychotics and neuroleptics, initial encounter: Secondary | ICD-10-CM | POA: Diagnosis not present

## 2018-11-08 DIAGNOSIS — S72002A Fracture of unspecified part of neck of left femur, initial encounter for closed fracture: Secondary | ICD-10-CM

## 2018-11-08 DIAGNOSIS — G3184 Mild cognitive impairment, so stated: Secondary | ICD-10-CM | POA: Diagnosis not present

## 2018-11-08 DIAGNOSIS — J984 Other disorders of lung: Secondary | ICD-10-CM | POA: Diagnosis not present

## 2018-11-08 DIAGNOSIS — S72012A Unspecified intracapsular fracture of left femur, initial encounter for closed fracture: Secondary | ICD-10-CM | POA: Diagnosis not present

## 2018-11-08 DIAGNOSIS — S72002D Fracture of unspecified part of neck of left femur, subsequent encounter for closed fracture with routine healing: Secondary | ICD-10-CM | POA: Diagnosis not present

## 2018-11-08 DIAGNOSIS — E860 Dehydration: Secondary | ICD-10-CM | POA: Diagnosis present

## 2018-11-08 DIAGNOSIS — M25552 Pain in left hip: Secondary | ICD-10-CM | POA: Diagnosis not present

## 2018-11-08 DIAGNOSIS — R5381 Other malaise: Secondary | ICD-10-CM | POA: Diagnosis not present

## 2018-11-08 DIAGNOSIS — R0902 Hypoxemia: Secondary | ICD-10-CM | POA: Diagnosis not present

## 2018-11-08 DIAGNOSIS — T84091A Other mechanical complication of internal left hip prosthesis, initial encounter: Secondary | ICD-10-CM | POA: Diagnosis not present

## 2018-11-08 DIAGNOSIS — M2550 Pain in unspecified joint: Secondary | ICD-10-CM | POA: Diagnosis not present

## 2018-11-08 DIAGNOSIS — R400 Somnolence: Secondary | ICD-10-CM | POA: Diagnosis not present

## 2018-11-08 DIAGNOSIS — S72092A Other fracture of head and neck of left femur, initial encounter for closed fracture: Secondary | ICD-10-CM | POA: Diagnosis not present

## 2018-11-08 DIAGNOSIS — S72009A Fracture of unspecified part of neck of unspecified femur, initial encounter for closed fracture: Secondary | ICD-10-CM | POA: Diagnosis present

## 2018-11-08 DIAGNOSIS — Z79899 Other long term (current) drug therapy: Secondary | ICD-10-CM | POA: Diagnosis not present

## 2018-11-08 DIAGNOSIS — Z96642 Presence of left artificial hip joint: Secondary | ICD-10-CM | POA: Diagnosis not present

## 2018-11-08 DIAGNOSIS — F329 Major depressive disorder, single episode, unspecified: Secondary | ICD-10-CM | POA: Diagnosis present

## 2018-11-08 DIAGNOSIS — J189 Pneumonia, unspecified organism: Secondary | ICD-10-CM | POA: Diagnosis not present

## 2018-11-08 DIAGNOSIS — R1312 Dysphagia, oropharyngeal phase: Secondary | ICD-10-CM | POA: Diagnosis not present

## 2018-11-08 DIAGNOSIS — Z9181 History of falling: Secondary | ICD-10-CM | POA: Diagnosis not present

## 2018-11-08 DIAGNOSIS — R41841 Cognitive communication deficit: Secondary | ICD-10-CM | POA: Diagnosis not present

## 2018-11-08 DIAGNOSIS — S72042D Displaced fracture of base of neck of left femur, subsequent encounter for closed fracture with routine healing: Secondary | ICD-10-CM | POA: Diagnosis not present

## 2018-11-08 DIAGNOSIS — Z1159 Encounter for screening for other viral diseases: Secondary | ICD-10-CM

## 2018-11-08 DIAGNOSIS — I10 Essential (primary) hypertension: Secondary | ICD-10-CM | POA: Diagnosis present

## 2018-11-08 DIAGNOSIS — R419 Unspecified symptoms and signs involving cognitive functions and awareness: Secondary | ICD-10-CM | POA: Diagnosis not present

## 2018-11-08 DIAGNOSIS — R404 Transient alteration of awareness: Secondary | ICD-10-CM | POA: Diagnosis not present

## 2018-11-08 DIAGNOSIS — R9431 Abnormal electrocardiogram [ECG] [EKG]: Secondary | ICD-10-CM | POA: Diagnosis not present

## 2018-11-08 DIAGNOSIS — Z09 Encounter for follow-up examination after completed treatment for conditions other than malignant neoplasm: Secondary | ICD-10-CM

## 2018-11-08 LAB — CBC WITH DIFFERENTIAL/PLATELET
Abs Immature Granulocytes: 0.07 10*3/uL (ref 0.00–0.07)
Basophils Absolute: 0 10*3/uL (ref 0.0–0.1)
Basophils Relative: 0 %
Eosinophils Absolute: 0.1 10*3/uL (ref 0.0–0.5)
Eosinophils Relative: 0 %
HCT: 39.5 % (ref 39.0–52.0)
Hemoglobin: 13.2 g/dL (ref 13.0–17.0)
Immature Granulocytes: 1 %
Lymphocytes Relative: 2 %
Lymphs Abs: 0.3 10*3/uL — ABNORMAL LOW (ref 0.7–4.0)
MCH: 30 pg (ref 26.0–34.0)
MCHC: 33.4 g/dL (ref 30.0–36.0)
MCV: 89.8 fL (ref 80.0–100.0)
Monocytes Absolute: 0.9 10*3/uL (ref 0.1–1.0)
Monocytes Relative: 6 %
Neutro Abs: 13.2 10*3/uL — ABNORMAL HIGH (ref 1.7–7.7)
Neutrophils Relative %: 91 %
Platelets: 111 10*3/uL — ABNORMAL LOW (ref 150–400)
RBC: 4.4 MIL/uL (ref 4.22–5.81)
RDW: 13.2 % (ref 11.5–15.5)
WBC: 14.5 10*3/uL — ABNORMAL HIGH (ref 4.0–10.5)
nRBC: 0 % (ref 0.0–0.2)

## 2018-11-08 LAB — BASIC METABOLIC PANEL
Anion gap: 11 (ref 5–15)
BUN: 31 mg/dL — ABNORMAL HIGH (ref 8–23)
CO2: 21 mmol/L — ABNORMAL LOW (ref 22–32)
Calcium: 8.4 mg/dL — ABNORMAL LOW (ref 8.9–10.3)
Chloride: 106 mmol/L (ref 98–111)
Creatinine, Ser: 1.43 mg/dL — ABNORMAL HIGH (ref 0.61–1.24)
GFR calc Af Amer: 50 mL/min — ABNORMAL LOW (ref >60)
GFR calc non Af Amer: 43 mL/min — ABNORMAL LOW (ref >60)
Glucose, Bld: 127 mg/dL — ABNORMAL HIGH (ref 70–99)
Potassium: 3.9 mmol/L (ref 3.5–5.1)
Sodium: 138 mmol/L (ref 135–145)

## 2018-11-08 LAB — PROTIME-INR
INR: 1 (ref 0.8–1.2)
Prothrombin Time: 13 seconds (ref 11.4–15.2)

## 2018-11-08 LAB — SARS CORONAVIRUS 2 BY RT PCR (HOSPITAL ORDER, PERFORMED IN ~~LOC~~ HOSPITAL LAB): SARS Coronavirus 2: NEGATIVE

## 2018-11-08 LAB — TYPE AND SCREEN
ABO/RH(D): A POS
Antibody Screen: NEGATIVE

## 2018-11-08 LAB — TSH: TSH: 0.341 u[IU]/mL — ABNORMAL LOW (ref 0.350–4.500)

## 2018-11-08 LAB — VITAMIN B12: Vitamin B-12: 389 pg/mL (ref 180–914)

## 2018-11-08 MED ORDER — TRANEXAMIC ACID-NACL 1000-0.7 MG/100ML-% IV SOLN
1000.0000 mg | INTRAVENOUS | Status: AC
  Administered 2018-11-09: 09:00:00 1000 mg via INTRAVENOUS
  Filled 2018-11-08: qty 100

## 2018-11-08 MED ORDER — FENTANYL CITRATE (PF) 100 MCG/2ML IJ SOLN
50.0000 ug | INTRAMUSCULAR | Status: AC | PRN
  Administered 2018-11-08 – 2018-11-09 (×3): 50 ug via INTRAVENOUS
  Filled 2018-11-08 (×2): qty 2

## 2018-11-08 MED ORDER — CHLORHEXIDINE GLUCONATE 4 % EX LIQD: 60.0000 mL | Freq: Once | CUTANEOUS | Status: DC

## 2018-11-08 MED ORDER — MUPIROCIN 2 % EX OINT
1.0000 "application " | TOPICAL_OINTMENT | Freq: Two times a day (BID) | CUTANEOUS | Status: AC
  Administered 2018-11-10 – 2018-11-12 (×4): 1 via NASAL
  Filled 2018-11-08 (×2): qty 22

## 2018-11-08 MED ORDER — ACETAMINOPHEN 650 MG RE SUPP: 650.0000 mg | Freq: Four times a day (QID) | RECTAL | Status: DC | PRN

## 2018-11-08 MED ORDER — ONDANSETRON HCL 4 MG/2ML IJ SOLN: 4.0000 mg | Freq: Four times a day (QID) | INTRAMUSCULAR | Status: DC | PRN

## 2018-11-08 MED ORDER — CEFAZOLIN SODIUM-DEXTROSE 2-4 GM/100ML-% IV SOLN
2.0000 g | INTRAVENOUS | Status: AC
  Administered 2018-11-09: 2 g via INTRAVENOUS
  Filled 2018-11-08: qty 100

## 2018-11-08 MED ORDER — SODIUM CHLORIDE 0.9 % IV SOLN: INTRAVENOUS | Status: DC

## 2018-11-08 MED ORDER — ONDANSETRON HCL 4 MG PO TABS: 4.0000 mg | ORAL_TABLET | Freq: Four times a day (QID) | ORAL | Status: DC | PRN

## 2018-11-08 MED ORDER — ACETAMINOPHEN 325 MG PO TABS: 650.0000 mg | ORAL_TABLET | Freq: Four times a day (QID) | ORAL | Status: DC | PRN

## 2018-11-08 MED ORDER — ONDANSETRON HCL 4 MG/2ML IJ SOLN: 4.0000 mg | Freq: Once | INTRAMUSCULAR | Status: DC

## 2018-11-08 NOTE — ED Triage Notes (Signed)
Per EMS left hip pain post fall at 0500; 100 mcg of fentanyl given en route with EMS.

## 2018-11-08 NOTE — Consult Note (Signed)
ORTHOPAEDIC CONSULTATION  REQUESTING PHYSICIAN: Domenic Polite, MD  Chief Complaint: Left hip pain  HPI: Anthony Hamilton is a 85 y.o. male with acute traumatic fall from standing resulting in left hip pain and inability to ambulate.  No LOC, at baseline state of health prior to fall.  Patient ambulated without device prior to this.    History is obtained from his wife primarily on the phone as the patient has some baseline memory issues.  He is otherwise relatively healthy.  He lives independently with her.  History reviewed. No pertinent past medical history. History reviewed. No pertinent surgical history. Social History   Socioeconomic History  . Marital status: Married    Spouse name: Not on file  . Number of children: Not on file  . Years of education: Not on file  . Highest education level: Not on file  Occupational History  . Not on file  Social Needs  . Financial resource strain: Not on file  . Food insecurity    Worry: Not on file    Inability: Not on file  . Transportation needs    Medical: Not on file    Non-medical: Not on file  Tobacco Use  . Smoking status: Not on file  Substance and Sexual Activity  . Alcohol use: Not on file  . Drug use: Not on file  . Sexual activity: Not on file  Lifestyle  . Physical activity    Days per week: Not on file    Minutes per session: Not on file  . Stress: Not on file  Relationships  . Social Herbalist on phone: Not on file    Gets together: Not on file    Attends religious service: Not on file    Active member of club or organization: Not on file    Attends meetings of clubs or organizations: Not on file    Relationship status: Not on file  Other Topics Concern  . Not on file  Social History Narrative  . Not on file   No family history on file. No Known Allergies Prior to Admission medications   Medication Sig Start Date End Date Taking? Authorizing Provider  aspirin EC 81 MG tablet  Take 81 mg by mouth daily.   Yes [provider]  lisinopril-hydrochlorothiazide (ZESTORETIC) 10-12.5 MG tablet Take 1 tablet by mouth daily. 09/30/18  Yes [provider]   Dg Chest 1 View  Result Date: 11/08/2018 CLINICAL DATA:  Fall today, left hip fracture. EXAM: CHEST  1 VIEW COMPARISON:  12/02/2006 FINDINGS: Mild enlargement of the cardiopericardial silhouette. Atherosclerotic calcification of the aortic arch. The patient is rotated to the left on today's radiograph, reducing diagnostic sensitivity and specificity. The lungs appear clear. No blunting of the costophrenic angles. Dextroconvex thoracic scoliosis. IMPRESSION: 1. Mild enlargement of the cardiopericardial silhouette, without edema. 2.  Aortic Atherosclerosis (ICD10-I70.0). Electronically Signed   By: Van Clines M.D.   On: 11/08/2018 12:15   Dg Hip Unilat With Pelvis 2-3 Views Left  Result Date: 11/08/2018 CLINICAL DATA:  Left hip pain, fall EXAM: DG HIP (WITH OR WITHOUT PELVIS) 2-3V LEFT COMPARISON:  None. FINDINGS: There is an acute angulated displaced left hip subcapital femoral neck fracture. No associated hip dislocation. Diffuse soft tissue swelling. Bony pelvis and right hip intact. IMPRESSION: Acute displaced left hip subcapital femoral neck fracture. Electronically Signed   By: Jerilynn Mages.  Shick M.D.   On: 11/08/2018 12:16   Family History Reviewed  and non-contributory, no pertinent history of problems with bleeding or anesthesia      Review of Systems 14 system ROS conducted and negative except for that noted in HPI   OBJECTIVE  Vitals: Patient Vitals for the past 8 hrs:  BP Temp Temp src Pulse Resp SpO2  11/08/18 1257 (!) 135/97 97.9 F (36.6 C) Oral 97 18 96 %  11/08/18 1030 - - - - - 95 %   General: Alert, no acute distress Cardiovascular: Warm extremities noted Respiratory: No cyanosis, no use of accessory musculature GI: No organomegaly, abdomen is soft and non-tender Skin: No lesions in  the area of chief complaint other than those listed below in MSK exam.  Neurologic: Sensation intact distally save for the below mentioned MSK exam Psychiatric:  normal mood and affect Lymphatic: No swelling obvious and reported other than the area involved in the exam below Extremities   BWL:[id number] and externally rotated.  ROM deferred. + GS/TA/EHL. Sensation intact in DP/SP/S/S/P distributions. 2+ DP pulse with warm and well perfused digits. Compartments soft and compressible, with no pain on passive stretch.     Test Results Imaging X-rays of the left hip and pelvis demonstrate a angulated comminuted femoral neck fracture with significant varus angulation.  Labs cbc Recent Labs    11/08/18 1105  WBC 14.5*  HGB 13.2  HCT 39.5  PLT 111*    Labs inflam No results for input(s): CRP in the last 72 hours.  Invalid input(s): ESR  Labs coag Recent Labs    11/08/18 1105  INR 1.0    Recent Labs    11/08/18 1105  NA 138  K 3.9  CL 106  CO2 21*  GLUCOSE 127*  BUN 31*  CREATININE 1.43*  CALCIUM 8.4*     ASSESSMENT AND PLAN: 85 y.o. male with the following: Left displaced femoral neck fracture  Discussed the nature of the injury as well as the care with the patient as well as the family.  Nonoperative measures are not well tolerated as patient's on bedrest for extended periods of time tend to develop secondary issues such as pneumonia, urinary tract infections, bedsores and delirium.  Based on this our recommendation is for operative measures.  The risks benefits and alternatives were discussed with the patient including but not limited to the risks of nonoperative treatment, versus surgical intervention including infection, bleeding, nerve injury, periprosthetic fracture, the need for revision surgery, dislocation, leg length discrepancy, gait change, blood clots, cardiopulmonary complications, morbidity, mortality, among others, and they were willing to proceed.      We discussed the care with the patient's wife.  She wants to go forward with surgery in the form of a hemiarthroplasty.  We would like the patient transferred to [facility] for surgery tomorrow morning ideally.  Please make n.p.o. after midnight.

## 2018-11-08 NOTE — Progress Notes (Signed)
Pt currently resting comfortably; mittens applied to prevent patient from pulling at iv lines and condom cath; Report given to [place]; verbalizes understanding

## 2018-11-08 NOTE — ED Provider Notes (Signed)
[facility] DEPT Provider Note   CSN: [id number] Arrival date & time: 11/08/18  1019     History   Chief Complaint Chief Complaint  Patient presents with   Fall    HPI Anthony Hamilton is a 85 y.o. male.     Patient is an 85 year old male with a history of underlying mild confusion for at least the last 6 months, prior kidney stones who is presenting today after falling at home.  Wife states that at 5 AM she heard a loud thump and found him laying on the floor.  Patient states he got up to go to the bathroom and when he was walking back to his bed he is not sure what happened but he fell.  Patient has been unable to stand or walk since that time and has had severe pain in his left hip.  He denies hitting his head or losing consciousness.  He denies headache or neck pain.  He denies taking any anticoagulation.  Patient was given 100 mcg of fentanyl in route with improvement in his pain.  He denies chest pain, shortness of breath, abdominal pain, nausea or vomiting.  He has had no dizziness or loss of consciousness.  The history is provided by the patient and the EMS personnel.    No past medical history on file.  There are no active problems to display for this patient.         Home Medications    Prior to Admission medications   Medication Sig Start Date End Date Taking? Authorizing Provider  aspirin EC 81 MG tablet Take 81 mg by mouth daily.   Yes [provider]  lisinopril-hydrochlorothiazide (ZESTORETIC) 10-12.5 MG tablet Take 1 tablet by mouth daily. 09/30/18  Yes [provider]    Family History No family history on file.  Social History Social History   Tobacco Use   Smoking status: Not on file  Substance Use Topics   Alcohol use: Not on file   Drug use: Not on file     Allergies   Patient has no allergy information on record.   Review of Systems Review of Systems  All other systems reviewed  and are negative.    Physical Exam Updated Vital Signs BP (!) 135/97    Pulse 97    Temp 97.9 F (36.6 C) (Oral)    Resp 18    SpO2 96%   Physical Exam Vitals signs and nursing note reviewed.  Constitutional:      General: He is not in acute distress.    Appearance: He is well-developed and normal weight.  HENT:     Head: Normocephalic and atraumatic.     Nose: Nose normal.     Mouth/Throat:     Mouth: Mucous membranes are moist.  Eyes:     Conjunctiva/sclera: Conjunctivae normal.     Pupils: Pupils are equal, round, and reactive to light.  Neck:     Musculoskeletal: Normal range of motion and neck supple. No neck rigidity, spinous process tenderness or muscular tenderness.  Cardiovascular:     Rate and Rhythm: Normal rate and regular rhythm.     Heart sounds: No murmur.  Pulmonary:     Effort: Pulmonary effort is normal. No respiratory distress.     Breath sounds: Normal breath sounds. No wheezing or rales.  Abdominal:     General: There is no distension.     Palpations: Abdomen is soft.     Tenderness:  There is no abdominal tenderness. There is no guarding or rebound.  Musculoskeletal:        General: Tenderness present.     Right hip: Normal.     Left hip: He exhibits decreased range of motion, tenderness, bony tenderness and deformity.     Comments: Left leg is shortened and externally rotated  Skin:    General: Skin is warm and dry.     Findings: No erythema or rash.  Neurological:     General: No focal deficit present.     Mental Status: He is alert. Mental status is at baseline.     Comments: Oriented to person and place  Psychiatric:        Behavior: Behavior normal.      ED Treatments / Results  Labs (all labs ordered are listed, but only abnormal results are displayed) Labs Reviewed  BASIC METABOLIC PANEL - Abnormal; Notable for the following components:      Result Value   CO2 21 (*)    Glucose, Bld 127 (*)    BUN 31 (*)    Creatinine, Ser 1.43  (*)    Calcium 8.4 (*)    GFR calc non Af Amer 43 (*)    GFR calc Af Amer 50 (*)    All other components within normal limits  CBC WITH DIFFERENTIAL/PLATELET - Abnormal; Notable for the following components:   WBC 14.5 (*)    Platelets 111 (*)    Neutro Abs 13.2 (*)    Lymphs Abs 0.3 (*)    All other components within normal limits  SARS CORONAVIRUS 2 (HOSPITAL ORDER, [facility])  PROTIME-INR  TSH  VITAMIN B12  TYPE AND SCREEN  ABO/RH    EKG EKG Interpretation  Date/Time:  Saturday November 08 2018 11:06:25 EDT Ventricular Rate:  91 PR Interval:    QRS Duration: 102 QT Interval:  375 QTC Calculation: 462 R Axis:   31 Text Interpretation:  Sinus rhythm No significant change since last tracing Confirmed by Blanchie Dessert [phone]) on 11/08/2018 11:12:18 AM   Radiology Dg Chest 1 View  Result Date: 11/08/2018 CLINICAL DATA:  Fall today, left hip fracture. EXAM: CHEST  1 VIEW COMPARISON:  12/02/2006 FINDINGS: Mild enlargement of the cardiopericardial silhouette. Atherosclerotic calcification of the aortic arch. The patient is rotated to the left on today's radiograph, reducing diagnostic sensitivity and specificity. The lungs appear clear. No blunting of the costophrenic angles. Dextroconvex thoracic scoliosis. IMPRESSION: 1. Mild enlargement of the cardiopericardial silhouette, without edema. 2.  Aortic Atherosclerosis (ICD10-I70.0). Electronically Signed   By: Van Clines M.D.   On: 11/08/2018 12:15   Dg Hip Unilat With Pelvis 2-3 Views Left  Result Date: 11/08/2018 CLINICAL DATA:  Left hip pain, fall EXAM: DG HIP (WITH OR WITHOUT PELVIS) 2-3V LEFT COMPARISON:  None. FINDINGS: There is an acute angulated displaced left hip subcapital femoral neck fracture. No associated hip dislocation. Diffuse soft tissue swelling. Bony pelvis and right hip intact. IMPRESSION: Acute displaced left hip subcapital femoral neck fracture. Electronically Signed   By: Jerilynn Mages.   Shick M.D.   On: 11/08/2018 12:16    Procedures Procedures (including critical care time)  Medications Ordered in ED Medications  0.9 %  sodium chloride infusion (has no administration in time range)  fentaNYL (SUBLIMAZE) injection 50 mcg (has no administration in time range)  ondansetron (ZOFRAN) injection 4 mg (has no administration in time range)     Initial Impression / Assessment and Plan /  ED Course  I have reviewed the triage vital signs and the nursing notes.  Pertinent labs & imaging results that were available during my care of the patient were reviewed by me and considered in my medical decision making (see chart for details).        Elderly male with a fall at home today when he was walking back from the bathroom without preceding symptoms concerning for syncope or dysrhythmia presenting with deformity of the left leg concerning for hip fracture.  Patient is neurovascularly intact at this time and at baseline.  Low suspicion for head injury and patient denies being on anticoagulation.  Patient was given fentanyl in route and is currently comfortable without significant pain.  Vital signs are reassuring.  Hip fracture protocol initiated.  1:31 PM Labs are without acute findings and stable creatinine.  Patient's COVID test is pending.  Chest x-ray is within normal limits and hip imaging is consistent with a left femoral neck fracture.  Spoke with Dr. Griffin Basil who requests that patient be transferred to [facility] so they can do his surgery tomorrow morning.  We will convey this to the hospitalist.  Final Clinical Impressions(s) / ED Diagnoses   Final diagnoses:  Closed fracture of neck of left femur, initial encounter [facility])    ED Discharge Orders    None       Blanchie Dessert, MD 11/08/18 1333

## 2018-11-08 NOTE — ED Notes (Signed)
Hedy [phone] wife contact number.

## 2018-11-08 NOTE — ED Notes (Signed)
ED TO INPATIENT HANDOFF REPORT  Name/Age/Gender Anthony Hamilton 85 y.o. male  Code Status   Home/SNF/Other Home  Chief Complaint Fall  Level of Care/Admitting Diagnosis ED Disposition    ED Disposition Condition Comment   Admit  Hospital Area: [facility] [[id number]]  Level of Care: Med-Surg [16]  Covid Evaluation: Screening Protocol (No Symptoms)  Diagnosis: Hip fracture [facility]) [[id number]]  Admitting Physician: Domenic Polite [[id number]]  Attending Physician: Domenic Polite [[id number]]  Estimated length of stay: past midnight tomorrow  Certification:: I certify this patient will need inpatient services for at least 2 midnights  PT Class (Do Not Modify): Inpatient [101]  PT Acc Code (Do Not Modify): Private [1]       Medical History History reviewed. No pertinent past medical history.  Allergies No Known Allergies  IV Location/Drains/Wounds Patient Lines/Drains/Airways Status   Active Line/Drains/Airways    Name:   Placement date:   Placement time:   Site:   Days:   Peripheral IV 11/08/18 Left Antecubital   11/08/18    1031    Antecubital   less than 1          Labs/Imaging Results for orders placed or performed during the hospital encounter of 11/08/18 (from the past 48 hour(s))  Type and screen [place]     Status: None   Collection Time: 11/08/18 11:02 AM  Result Value Ref Range   ABO/RH(D) A POS    Antibody Screen NEG    Sample Expiration      11/11/2018,2359 Performed at [facility], [place] [address], [place], [place] [address]   ABO/Rh     Status: None (Preliminary result)   Collection Time: 11/08/18 11:02 AM  Result Value Ref Range   ABO/RH(D)      A POS Performed at [facility], [place] [address], [place], [place] [address]   Basic metabolic panel     Status: Abnormal   Collection Time: 11/08/18 11:05 AM  Result Value Ref Range   Sodium 138 135 - 145 mmol/L   Potassium 3.9  3.5 - 5.1 mmol/L   Chloride 106 98 - 111 mmol/L   CO2 21 (L) 22 - 32 mmol/L   Glucose, Bld 127 (H) 70 - 99 mg/dL   BUN 31 (H) 8 - 23 mg/dL   Creatinine, Ser 1.43 (H) 0.61 - 1.24 mg/dL   Calcium 8.4 (L) 8.9 - 10.3 mg/dL   GFR calc non Af Amer 43 (L) >60 mL/min   GFR calc Af Amer 50 (L) >60 mL/min   Anion gap 11 5 - 15    Comment: Performed at [facility], [place] [address], [place], [place] [address]  CBC WITH DIFFERENTIAL     Status: Abnormal   Collection Time: 11/08/18 11:05 AM  Result Value Ref Range   WBC 14.5 (H) 4.0 - 10.5 K/uL   RBC 4.40 4.22 - 5.81 MIL/uL   Hemoglobin 13.2 13.0 - 17.0 g/dL   HCT 39.5 39.0 - 52.0 %   MCV 89.8 80.0 - 100.0 fL   MCH 30.0 26.0 - 34.0 pg   MCHC 33.4 30.0 - 36.0 g/dL   RDW 13.2 11.5 - 15.5 %   Platelets 111 (L) 150 - 400 K/uL    Comment: REPEATED TO VERIFY PLATELET COUNT CONFIRMED BY SMEAR SPECIMEN CHECKED FOR CLOTS Immature Platelet Fraction may be clinically indicated, consider ordering this additional test [id number]    nRBC 0.0 0.0 - 0.2 %  Neutrophils Relative % 91 %   Neutro Abs 13.2 (H) 1.7 - 7.7 K/uL   Lymphocytes Relative 2 %   Lymphs Abs 0.3 (L) 0.7 - 4.0 K/uL   Monocytes Relative 6 %   Monocytes Absolute 0.9 0.1 - 1.0 K/uL   Eosinophils Relative 0 %   Eosinophils Absolute 0.1 0.0 - 0.5 K/uL   Basophils Relative 0 %   Basophils Absolute 0.0 0.0 - 0.1 K/uL   RBC Morphology MORPHOLOGY UNREMARKABLE    Immature Granulocytes 1 %   Abs Immature Granulocytes 0.07 0.00 - 0.07 K/uL    Comment: Performed at [facility], [place] [address], [place], [place] [address]  Protime-INR     Status: None   Collection Time: 11/08/18 11:05 AM  Result Value Ref Range   Prothrombin Time 13.0 11.4 - 15.2 seconds   INR 1.0 0.8 - 1.2    Comment: (NOTE) INR goal varies based on device and disease states. Performed at [facility], [place] [address] [place], [place] [address]    Dg Chest 1  View  Result Date: 11/08/2018 CLINICAL DATA:  Fall today, left hip fracture. EXAM: CHEST  1 VIEW COMPARISON:  12/02/2006 FINDINGS: Mild enlargement of the cardiopericardial silhouette. Atherosclerotic calcification of the aortic arch. The patient is rotated to the left on today's radiograph, reducing diagnostic sensitivity and specificity. The lungs appear clear. No blunting of the costophrenic angles. Dextroconvex thoracic scoliosis. IMPRESSION: 1. Mild enlargement of the cardiopericardial silhouette, without edema. 2.  Aortic Atherosclerosis (ICD10-I70.0). Electronically Signed   By: Van Clines M.D.   On: 11/08/2018 12:15   Dg Hip Unilat With Pelvis 2-3 Views Left  Result Date: 11/08/2018 CLINICAL DATA:  Left hip pain, fall EXAM: DG HIP (WITH OR WITHOUT PELVIS) 2-3V LEFT COMPARISON:  None. FINDINGS: There is an acute angulated displaced left hip subcapital femoral neck fracture. No associated hip dislocation. Diffuse soft tissue swelling. Bony pelvis and right hip intact. IMPRESSION: Acute displaced left hip subcapital femoral neck fracture. Electronically Signed   By: Jerilynn Mages.  Shick M.D.   On: 11/08/2018 12:16    Pending Labs Unresulted Labs (From admission, onward)    Start     Ordered   11/08/18 1253  TSH  Add-on,   AD     11/08/18 1253   11/08/18 1253  Vitamin B12  Add-on,   AD     11/08/18 1253   11/08/18 1208  SARS Coronavirus 2 (CEPHEID - Performed in [facility] hospital lab), Hosp Order  (Asymptomatic Patients Labs)  Once,   STAT    Question:  Rule Out  Answer:  Yes   11/08/18 1207   Signed and Held  CBC  Tomorrow morning,   R     Signed and Held   Signed and Held  Basic metabolic panel  Tomorrow morning,   R     Signed and Held          Vitals/Pain Today's Vitals   11/08/18 1030 11/08/18 1042 11/08/18 1209 11/08/18 1257  BP:    (!) 135/97  Pulse:    97  Resp:    18  Temp:    97.9 F (36.6 C)  TempSrc:    Oral  SpO2: 95%   96%  PainSc:  5  5  5      Isolation  Precautions No active isolations  Medications Medications  fentaNYL (SUBLIMAZE) injection 50 mcg (50 mcg Intravenous Given 11/08/18 1301)  ondansetron (ZOFRAN) injection 4 mg (has  no administration in time range)    Mobility Walks however here for broken hip will need reevaluation related to mobility

## 2018-11-08 NOTE — H&P (Signed)
History and Physical    Anthony Hamilton:[id number] DOB: Jan 04, 1928 DOA: 11/08/2018  Referring MD/NP/PA: EDP PCP:  Patient coming from: Home  Chief Complaint: Fall, hip pain  HPI: Anthony Hamilton is a 85 y.o. male with medical history significant of hypertension, CKD 3, memory loss and cognitive dysfunction worsened over the past 6 months presented to the emergency room following a fall this morning. -Patient reports waking up to use the bathroom following which she was walking back and fell down, he does not remember the circumstances around his fall very clearly, does not think he lost consciousness.  His wife woke up with the tight sound associated with his fall, he was awake and alert at that time according to her but unable to get off the floor. -Wife reports that he has been in his usual state of health this week has significant short-term memory deficits but otherwise was at baseline until the events of this morning. -Wife reports that he is physically very active, mows the lawn, does all the household activities around the house  ED Course: Labs noted creatinine of 1.4, white count of 14, x-ray noted left hip subcapital femoral neck fracture, TRH and orthopedics were consulted  Review of Systems: As per HPI otherwise 14 point review of systems negative.   Past medical history -Hypertension -Remote pericardial effusion in 2006.  History reviewed. No pertinent surgical history.  Social history -Denies any alcohol or tobacco use -Lives at home with his spouse, independent in most ADLs  No Known Allergies  Family history  -Unknown due to cognitive dysfunction   Prior to Admission medications   Medication Sig Start Date End Date Taking? Authorizing Provider  aspirin EC 81 MG tablet Take 81 mg by mouth daily.   Yes [provider]  lisinopril-hydrochlorothiazide (ZESTORETIC) 10-12.5 MG tablet Take 1 tablet by mouth daily. 09/30/18  Yes [provider]    Physical Exam: Elderly, thinly built male, laying in bed awake alert oriented to self and partly to place only Vitals:   11/08/18 1030  SpO2: 95%      Constitutional: NAD, calm, comfortable Vitals:   11/08/18 1030  SpO2: 95%   Eyes: PERRL, lids and conjunctivae normal ENMT: Oral mucosa is moist Neck: normal, supple, neck no JVD Respiratory: CTAB Cardiovascular: Regular rate and rhythm, no murmurs / rubs / gallops Abdomen: soft, non tender, Bowel sounds positive.  Ext: No edema, left leg shortened and externally rotated Skin: no rashes, lesions, ulcers.  Neurologic: non focal Psychiatric: Pleasant, poor insight and judgment.   Labs on Admission: I have personally reviewed following labs and imaging studies  CBC: Recent Labs  Lab 11/08/18 1105  WBC 14.5*  NEUTROABS 13.2*  HGB 13.2  HCT 39.5  MCV 89.8  PLT 767*   Basic Metabolic Panel: Recent Labs  Lab 11/08/18 1105  NA 138  K 3.9  CL 106  CO2 21*  GLUCOSE 127*  BUN 31*  CREATININE 1.43*  CALCIUM 8.4*   GFR: CrCl cannot be calculated (Unknown ideal weight.). Liver Function Tests: No results for input(s): AST, ALT, ALKPHOS, BILITOT, PROT, ALBUMIN in the last 168 hours. No results for input(s): LIPASE, AMYLASE in the last 168 hours. No results for input(s): AMMONIA in the last 168 hours. Coagulation Profile: Recent Labs  Lab 11/08/18 1105  INR 1.0   Cardiac Enzymes: No results for input(s): CKTOTAL, CKMB, CKMBINDEX, TROPONINI in the last 168 hours. BNP (last 3 results) No results for input(s): PROBNP in the  last 8760 hours. HbA1C: No results for input(s): HGBA1C in the last 72 hours. CBG: No results for input(s): GLUCAP in the last 168 hours. Lipid Profile: No results for input(s): CHOL, HDL, LDLCALC, TRIG, CHOLHDL, LDLDIRECT in the last 72 hours. Thyroid Function Tests: No results for input(s): TSH, T4TOTAL, FREET4, T3FREE, THYROIDAB in the last 72 hours. Anemia Panel: No  results for input(s): VITAMINB12, FOLATE, FERRITIN, TIBC, IRON, RETICCTPCT in the last 72 hours. Urine analysis:    Component Value Date/Time   COLORURINE YELLOW 04/12/2007 1602   APPEARANCEUR CLOUDY (A) 04/12/2007 1602   LABSPEC 1.018 04/12/2007 1602   PHURINE 6.0 04/12/2007 1602   GLUCOSEU NEGATIVE 04/12/2007 1602   HGBUR LARGE (A) 04/12/2007 1602   BILIRUBINUR NEGATIVE 04/12/2007 1602   KETONESUR 40 (A) 04/12/2007 1602   PROTEINUR NEGATIVE 04/12/2007 1602   UROBILINOGEN 0.2 04/12/2007 1602   NITRITE NEGATIVE 04/12/2007 1602   LEUKOCYTESUR NEGATIVE 04/12/2007 1602   Sepsis Labs: @LABRCNTIP (procalcitonin:4,lacticidven:4) )No results found for this or any previous visit (from the past 240 hour(s)).   Radiological Exams on Admission: Dg Chest 1 View  Result Date: 11/08/2018 CLINICAL DATA:  Fall today, left hip fracture. EXAM: CHEST  1 VIEW COMPARISON:  12/02/2006 FINDINGS: Mild enlargement of the cardiopericardial silhouette. Atherosclerotic calcification of the aortic arch. The patient is rotated to the left on today's radiograph, reducing diagnostic sensitivity and specificity. The lungs appear clear. No blunting of the costophrenic angles. Dextroconvex thoracic scoliosis. IMPRESSION: 1. Mild enlargement of the cardiopericardial silhouette, without edema. 2.  Aortic Atherosclerosis (ICD10-I70.0). Electronically Signed   By: Van Clines M.D.   On: 11/08/2018 12:15   Dg Hip Unilat With Pelvis 2-3 Views Left  Result Date: 11/08/2018 CLINICAL DATA:  Left hip pain, fall EXAM: DG HIP (WITH OR WITHOUT PELVIS) 2-3V LEFT COMPARISON:  None. FINDINGS: There is an acute angulated displaced left hip subcapital femoral neck fracture. No associated hip dislocation. Diffuse soft tissue swelling. Bony pelvis and right hip intact. IMPRESSION: Acute displaced left hip subcapital femoral neck fracture. Electronically Signed   By: Jerilynn Mages.  Shick M.D.   On: 11/08/2018 12:16    EKG: Independently  reviewed.  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

## 2018-11-08 NOTE — Consult Note (Signed)
Discussed the plan of care with Dr. Maryan Rued from the emergency room.  We also discussed the patient's care with his wife.  We have operating availability at [facility] tomorrow morning so would like the patient transferred to [facility] to perform a left hip hemiarthroplasty tomorrow.  Please make n.p.o. after midnight.  Formal consult note pending.

## 2018-11-09 ENCOUNTER — Inpatient Hospital Stay (HOSPITAL_COMMUNITY): Payer: Medicare Other | Admitting: Certified Registered Nurse Anesthetist

## 2018-11-09 ENCOUNTER — Other Ambulatory Visit: Payer: Self-pay

## 2018-11-09 ENCOUNTER — Encounter (HOSPITAL_COMMUNITY)
Admission: EM | Disposition: A | Discharge status: Skilled Nursing Facility | Payer: Self-pay | Source: Home / Self Care | Attending: Internal Medicine

## 2018-11-09 ENCOUNTER — Inpatient Hospital Stay (HOSPITAL_COMMUNITY): Payer: Medicare Other

## 2018-11-09 HISTORY — PX: HIP ARTHROPLASTY: SHX981

## 2018-11-09 LAB — TYPE AND SCREEN
ABO/RH(D): A POS
Antibody Screen: NEGATIVE

## 2018-11-09 LAB — SURGICAL PCR SCREEN
MRSA, PCR: NEGATIVE
Staphylococcus aureus: NEGATIVE

## 2018-11-09 LAB — NOVEL CORONAVIRUS, NAA (HOSP ORDER, SEND-OUT TO REF LAB; TAT 18-24 HRS): SARS-CoV-2, NAA: NOT DETECTED

## 2018-11-09 LAB — BASIC METABOLIC PANEL
Anion gap: 9 (ref 5–15)
BUN: 35 mg/dL — ABNORMAL HIGH (ref 8–23)
CO2: 24 mmol/L (ref 22–32)
Calcium: 8.4 mg/dL — ABNORMAL LOW (ref 8.9–10.3)
Chloride: 106 mmol/L (ref 98–111)
Creatinine, Ser: 1.76 mg/dL — ABNORMAL HIGH (ref 0.61–1.24)
GFR calc Af Amer: 39 mL/min — ABNORMAL LOW (ref >60)
GFR calc non Af Amer: 34 mL/min — ABNORMAL LOW (ref >60)
Glucose, Bld: 186 mg/dL — ABNORMAL HIGH (ref 70–99)
Potassium: 3.6 mmol/L (ref 3.5–5.1)
Sodium: 139 mmol/L (ref 135–145)

## 2018-11-09 LAB — CBC
HCT: 37.4 % — ABNORMAL LOW (ref 39.0–52.0)
HCT: 35 % — ABNORMAL LOW (ref 39.0–52.0)
Hemoglobin: 12.5 g/dL — ABNORMAL LOW (ref 13.0–17.0)
Hemoglobin: 11.8 g/dL — ABNORMAL LOW (ref 13.0–17.0)
MCH: 29.8 pg (ref 26.0–34.0)
MCH: 29.9 pg (ref 26.0–34.0)
MCHC: 33.4 g/dL (ref 30.0–36.0)
MCHC: 33.7 g/dL (ref 30.0–36.0)
MCV: 89.3 fL (ref 80.0–100.0)
MCV: 88.6 fL (ref 80.0–100.0)
Platelets: 135 10*3/uL — ABNORMAL LOW (ref 150–400)
Platelets: 131 10*3/uL — ABNORMAL LOW (ref 150–400)
RBC: 4.19 MIL/uL — ABNORMAL LOW (ref 4.22–5.81)
RBC: 3.95 MIL/uL — ABNORMAL LOW (ref 4.22–5.81)
RDW: 13.3 % (ref 11.5–15.5)
RDW: 13.5 % (ref 11.5–15.5)
WBC: 13.1 10*3/uL — ABNORMAL HIGH (ref 4.0–10.5)
WBC: 15.2 10*3/uL — ABNORMAL HIGH (ref 4.0–10.5)
nRBC: 0 % (ref 0.0–0.2)
nRBC: 0 % (ref 0.0–0.2)

## 2018-11-09 LAB — CREATININE, SERUM
Creatinine, Ser: 1.76 mg/dL — ABNORMAL HIGH (ref 0.61–1.24)
GFR calc Af Amer: 39 mL/min — ABNORMAL LOW (ref >60)
GFR calc non Af Amer: 34 mL/min — ABNORMAL LOW (ref >60)

## 2018-11-09 LAB — ABO/RH
ABO/RH(D): A POS
ABO/RH(D): A POS

## 2018-11-09 SURGERY — HEMIARTHROPLASTY, HIP, DIRECT ANTERIOR APPROACH, FOR FRACTURE
Anesthesia: General | Site: Hip | Laterality: Left

## 2018-11-09 MED ORDER — PHENOL 1.4 % MT LIQD: 1.0000 | OROMUCOSAL | Status: DC | PRN

## 2018-11-09 MED ORDER — ROCURONIUM BROMIDE 10 MG/ML (PF) SYRINGE
PREFILLED_SYRINGE | INTRAVENOUS | Status: DC | PRN
  Administered 2018-11-09: 50 mg via INTRAVENOUS

## 2018-11-09 MED ORDER — ACETAMINOPHEN 500 MG PO TABS
ORAL_TABLET | ORAL | Status: AC
  Administered 2018-11-09: 1000 mg via ORAL
  Filled 2018-11-09: qty 2

## 2018-11-09 MED ORDER — ENSURE ENLIVE PO LIQD
237.0000 mL | Freq: Two times a day (BID) | ORAL | Status: DC
  Administered 2018-11-09 – 2018-11-17 (×15): 237 mL via ORAL

## 2018-11-09 MED ORDER — LACTATED RINGERS IV SOLN
INTRAVENOUS | Status: DC
  Administered 2018-11-09: 09:00:00 via INTRAVENOUS

## 2018-11-09 MED ORDER — DEXAMETHASONE SODIUM PHOSPHATE 10 MG/ML IJ SOLN
INTRAMUSCULAR | Status: DC | PRN
  Administered 2018-11-09: 5 mg via INTRAVENOUS

## 2018-11-09 MED ORDER — VANCOMYCIN HCL 1 G IV SOLR
INTRAVENOUS | Status: DC | PRN
  Administered 2018-11-09: 1000 mg via TOPICAL

## 2018-11-09 MED ORDER — SUCCINYLCHOLINE CHLORIDE 20 MG/ML IJ SOLN
INTRAMUSCULAR | Status: DC | PRN
  Administered 2018-11-09: 80 mg via INTRAVENOUS

## 2018-11-09 MED ORDER — VANCOMYCIN HCL 1000 MG IV SOLR
INTRAVENOUS | Status: AC
  Filled 2018-11-09: qty 1000

## 2018-11-09 MED ORDER — FENTANYL CITRATE (PF) 250 MCG/5ML IJ SOLN
INTRAMUSCULAR | Status: DC | PRN
  Administered 2018-11-09 (×2): 50 ug via INTRAVENOUS

## 2018-11-09 MED ORDER — CEFAZOLIN SODIUM-DEXTROSE 2-4 GM/100ML-% IV SOLN
2.0000 g | Freq: Four times a day (QID) | INTRAVENOUS | Status: AC
  Administered 2018-11-09 (×2): 2 g via INTRAVENOUS
  Filled 2018-11-09 (×2): qty 100

## 2018-11-09 MED ORDER — PHENYLEPHRINE 40 MCG/ML (10ML) SYRINGE FOR IV PUSH (FOR BLOOD PRESSURE SUPPORT)
PREFILLED_SYRINGE | INTRAVENOUS | Status: DC | PRN
  Administered 2018-11-09: 80 ug via INTRAVENOUS
  Administered 2018-11-09: 120 ug via INTRAVENOUS

## 2018-11-09 MED ORDER — LIDOCAINE 2% (20 MG/ML) 5 ML SYRINGE
INTRAMUSCULAR | Status: AC
  Filled 2018-11-09: qty 5

## 2018-11-09 MED ORDER — ONDANSETRON HCL 4 MG/2ML IJ SOLN
INTRAMUSCULAR | Status: DC | PRN
  Administered 2018-11-09: 4 mg via INTRAVENOUS

## 2018-11-09 MED ORDER — METOCLOPRAMIDE HCL 5 MG PO TABS: 5.0000 mg | ORAL_TABLET | Freq: Three times a day (TID) | ORAL | Status: DC | PRN

## 2018-11-09 MED ORDER — 0.9 % SODIUM CHLORIDE (POUR BTL) OPTIME
TOPICAL | Status: DC | PRN
  Administered 2018-11-09: 1000 mL

## 2018-11-09 MED ORDER — HYDROMORPHONE HCL 1 MG/ML IJ SOLN
0.5000 mg | INTRAMUSCULAR | Status: DC | PRN
  Administered 2018-11-09 (×3): 0.5 mg via INTRAVENOUS
  Filled 2018-11-09 (×3): qty 1

## 2018-11-09 MED ORDER — PROPOFOL 10 MG/ML IV BOLUS
INTRAVENOUS | Status: DC | PRN
  Administered 2018-11-09: 70 mg via INTRAVENOUS

## 2018-11-09 MED ORDER — METOCLOPRAMIDE HCL 5 MG/ML IJ SOLN: 5.0000 mg | Freq: Three times a day (TID) | INTRAMUSCULAR | Status: DC | PRN

## 2018-11-09 MED ORDER — FENTANYL CITRATE (PF) 100 MCG/2ML IJ SOLN
INTRAMUSCULAR | Status: AC
  Administered 2018-11-09: 11:00:00 50 ug via INTRAVENOUS
  Filled 2018-11-09: qty 2

## 2018-11-09 MED ORDER — FENTANYL CITRATE (PF) 100 MCG/2ML IJ SOLN
INTRAMUSCULAR | Status: AC
  Administered 2018-11-09: 50 ug via INTRAVENOUS
  Filled 2018-11-09: qty 2

## 2018-11-09 MED ORDER — LIDOCAINE 2% (20 MG/ML) 5 ML SYRINGE
INTRAMUSCULAR | Status: DC | PRN
  Administered 2018-11-09: 60 mg via INTRAVENOUS

## 2018-11-09 MED ORDER — ENOXAPARIN SODIUM 40 MG/0.4ML ~~LOC~~ SOLN
40.0000 mg | SUBCUTANEOUS | Status: DC
  Administered 2018-11-10 – 2018-11-14 (×5): 40 mg via SUBCUTANEOUS
  Filled 2018-11-09 (×5): qty 0.4

## 2018-11-09 MED ORDER — ACETAMINOPHEN 500 MG PO TABS
1000.0000 mg | ORAL_TABLET | Freq: Three times a day (TID) | ORAL | Status: AC
  Administered 2018-11-09 – 2018-11-13 (×8): 1000 mg via ORAL
  Filled 2018-11-09 (×11): qty 2

## 2018-11-09 MED ORDER — MENTHOL 3 MG MT LOZG: 1.0000 | LOZENGE | OROMUCOSAL | Status: DC | PRN

## 2018-11-09 MED ORDER — DEXAMETHASONE SODIUM PHOSPHATE 10 MG/ML IJ SOLN
INTRAMUSCULAR | Status: AC
  Filled 2018-11-09: qty 1

## 2018-11-09 MED ORDER — ONDANSETRON HCL 4 MG/2ML IJ SOLN
INTRAMUSCULAR | Status: AC
  Filled 2018-11-09: qty 2

## 2018-11-09 MED ORDER — OXYCODONE HCL 5 MG PO TABS
5.0000 mg | ORAL_TABLET | ORAL | Status: DC | PRN
  Administered 2018-11-14: 10 mg via ORAL
  Filled 2018-11-09: qty 2

## 2018-11-09 MED ORDER — FENTANYL CITRATE (PF) 250 MCG/5ML IJ SOLN
INTRAMUSCULAR | Status: AC
  Filled 2018-11-09: qty 5

## 2018-11-09 MED ORDER — ONDANSETRON HCL 4 MG PO TABS: 4.0000 mg | ORAL_TABLET | Freq: Four times a day (QID) | ORAL | Status: DC | PRN

## 2018-11-09 MED ORDER — PHENYLEPHRINE 40 MCG/ML (10ML) SYRINGE FOR IV PUSH (FOR BLOOD PRESSURE SUPPORT)
PREFILLED_SYRINGE | INTRAVENOUS | Status: AC
  Filled 2018-11-09: qty 10

## 2018-11-09 MED ORDER — ACETAMINOPHEN 500 MG PO TABS
1000.0000 mg | ORAL_TABLET | Freq: Once | ORAL | Status: AC
  Administered 2018-11-09: 09:00:00 1000 mg via ORAL

## 2018-11-09 MED ORDER — SUCCINYLCHOLINE CHLORIDE 200 MG/10ML IV SOSY
PREFILLED_SYRINGE | INTRAVENOUS | Status: AC
  Filled 2018-11-09: qty 10

## 2018-11-09 MED ORDER — FENTANYL CITRATE (PF) 100 MCG/2ML IJ SOLN
25.0000 ug | INTRAMUSCULAR | Status: DC | PRN
  Administered 2018-11-09 (×3): 50 ug via INTRAVENOUS

## 2018-11-09 MED ORDER — ROCURONIUM BROMIDE 10 MG/ML (PF) SYRINGE
PREFILLED_SYRINGE | INTRAVENOUS | Status: AC
  Filled 2018-11-09: qty 10

## 2018-11-09 MED ORDER — DOCUSATE SODIUM 100 MG PO CAPS
100.0000 mg | ORAL_CAPSULE | Freq: Two times a day (BID) | ORAL | Status: DC
  Administered 2018-11-09 – 2018-11-14 (×9): 100 mg via ORAL
  Filled 2018-11-09 (×11): qty 1

## 2018-11-09 MED ORDER — ADULT MULTIVITAMIN W/MINERALS CH
1.0000 | ORAL_TABLET | Freq: Every day | ORAL | Status: DC
  Administered 2018-11-10 – 2018-11-16 (×6): 1 via ORAL
  Filled 2018-11-09 (×8): qty 1

## 2018-11-09 MED ORDER — PROPOFOL 10 MG/ML IV BOLUS
INTRAVENOUS | Status: AC
  Filled 2018-11-09: qty 20

## 2018-11-09 MED ORDER — ONDANSETRON HCL 4 MG/2ML IJ SOLN: 4.0000 mg | Freq: Four times a day (QID) | INTRAMUSCULAR | Status: DC | PRN

## 2018-11-09 MED ORDER — SUGAMMADEX SODIUM 200 MG/2ML IV SOLN
INTRAVENOUS | Status: DC | PRN
  Administered 2018-11-09 (×2): 100 mg via INTRAVENOUS

## 2018-11-09 SURGICAL SUPPLY — 52 items
BLADE SAGITTAL 25.0X1.27X90 (BLADE) ×2 IMPLANT
BLADE SAGITTAL 25.0X1.27X90MM (BLADE) ×1
CHLORAPREP W/TINT 26ML (MISCELLANEOUS) ×3 IMPLANT
CLOSURE STERI-STRIP 1/2X4 (GAUZE/BANDAGES/DRESSINGS) ×1
CLOSURE WOUND 1/2 X4 (GAUZE/BANDAGES/DRESSINGS) ×1
CLSR STERI-STRIP ANTIMIC 1/2X4 (GAUZE/BANDAGES/DRESSINGS) ×2 IMPLANT
COVER SURGICAL LIGHT HANDLE (MISCELLANEOUS) ×3 IMPLANT
COVER WAND RF STERILE (DRAPES) IMPLANT
DRAPE INCISE IOBAN 66X45 STRL (DRAPES) ×3 IMPLANT
DRAPE ORTHO SPLIT 77X108 STRL (DRAPES) ×4
DRAPE SURG ORHT 6 SPLT 77X108 (DRAPES) ×2 IMPLANT
DRAPE U-SHAPE 47X51 STRL (DRAPES) ×3 IMPLANT
DRSG AQUACEL AG ADV 3.5X 6 (GAUZE/BANDAGES/DRESSINGS) ×3 IMPLANT
ELECT BLADE 4.0 EZ CLEAN MEGAD (MISCELLANEOUS) ×3
ELECT CAUTERY BLADE 6.4 (BLADE) ×3 IMPLANT
ELECT REM PT RETURN 9FT ADLT (ELECTROSURGICAL) ×3
ELECTRODE BLDE 4.0 EZ CLN MEGD (MISCELLANEOUS) ×1 IMPLANT
ELECTRODE REM PT RTRN 9FT ADLT (ELECTROSURGICAL) ×1 IMPLANT
GLOVE BIO SURGEON STRL SZ8 (GLOVE) ×6 IMPLANT
GLOVE BIOGEL PI IND STRL 8 (GLOVE) ×1 IMPLANT
GLOVE BIOGEL PI INDICATOR 8 (GLOVE) ×2
GLOVE BIOGEL PI ORTHO PRO SZ8 (GLOVE) ×2
GLOVE PI ORTHO PRO STRL SZ8 (GLOVE) ×1 IMPLANT
GOWN SPEC L3 XXLG W/TWL (GOWN DISPOSABLE) ×3 IMPLANT
GOWN STRL REUS W/ TWL LRG LVL3 (GOWN DISPOSABLE) ×2 IMPLANT
GOWN STRL REUS W/ TWL XL LVL3 (GOWN DISPOSABLE) ×1 IMPLANT
GOWN STRL REUS W/TWL LRG LVL3 (GOWN DISPOSABLE) ×4
GOWN STRL REUS W/TWL XL LVL3 (GOWN DISPOSABLE) ×2
HEAD FEM UNIPOLAR 51 OD STRL (Hips) ×3 IMPLANT
KIT BASIN OR (CUSTOM PROCEDURE TRAY) ×3 IMPLANT
KIT TURNOVER KIT B (KITS) ×3 IMPLANT
MANIFOLD NEPTUNE II (INSTRUMENTS) ×3 IMPLANT
NEEDLE MAYO TROCAR (NEEDLE) IMPLANT
PACK TOTAL JOINT (CUSTOM PROCEDURE TRAY) ×3 IMPLANT
PAD ARMBOARD 7.5X6 YLW CONV (MISCELLANEOUS) ×9 IMPLANT
PILLOW ABDUCTION HIP (SOFTGOODS) ×3 IMPLANT
RETRIEVER SUT HEWSON (MISCELLANEOUS) ×3 IMPLANT
SPACER FEM TAPERED +0 12/14 (Hips) ×3 IMPLANT
STAPLER VISISTAT 35W (STAPLE) ×3 IMPLANT
STEM SUMMIT BASIC PRESSFIT SZ5 (Hips) ×3 IMPLANT
STRIP CLOSURE SKIN 1/2X4 (GAUZE/BANDAGES/DRESSINGS) ×2 IMPLANT
SUT FIBERWIRE #2 38 REV NDL BL (SUTURE) ×9
SUT MON AB 3-0 SH 27 (SUTURE)
SUT MON AB 3-0 SH27 (SUTURE) IMPLANT
SUT VIC AB 0 CT1 27 (SUTURE) ×4
SUT VIC AB 0 CT1 27XBRD ANBCTR (SUTURE) ×2 IMPLANT
SUT VIC AB 2-0 CT1 27 (SUTURE) ×4
SUT VIC AB 2-0 CT1 TAPERPNT 27 (SUTURE) ×2 IMPLANT
SUT VIC AB 2-0 SH 27 (SUTURE)
SUT VIC AB 2-0 SH 27XBRD (SUTURE) IMPLANT
SUTURE FIBERWR#2 38 REV NDL BL (SUTURE) ×3 IMPLANT
TRAY FOLEY W/BAG SLVR 14FR (SET/KITS/TRAYS/PACK) IMPLANT

## 2018-11-09 NOTE — Anesthesia Preprocedure Evaluation (Addendum)
Anesthesia Evaluation  Patient identified by MRN, date of birth, ID band Patient awake    Reviewed: Allergy & Precautions, H&P , NPO status , Patient's Chart, lab work & pertinent test results  Airway Mallampati: II  TM Distance: >3 FB Neck ROM: Full    Dental no notable dental hx. (+) Teeth Intact, Dental Advisory Given   Pulmonary neg pulmonary ROS,    Pulmonary exam normal breath sounds clear to auscultation       Cardiovascular hypertension, Pt. on medications  Rhythm:Regular Rate:Normal     Neuro/Psych Dementia negative neurological ROS     GI/Hepatic negative GI ROS, Neg liver ROS,   Endo/Other  negative endocrine ROS  Renal/GU negative Renal ROS  negative genitourinary   Musculoskeletal   Abdominal   Peds  Hematology negative hematology ROS (+)   Anesthesia Other Findings   Reproductive/Obstetrics negative OB ROS                            Anesthesia Physical Anesthesia Plan  ASA: II  Anesthesia Plan: General   Post-op Pain Management:    Induction: Intravenous  PONV Risk Score and Plan: 3 and Ondansetron, Dexamethasone and Treatment may vary due to age or medical condition  Airway Management Planned: Oral ETT  Additional Equipment:   Intra-op Plan:   Post-operative Plan: Extubation in OR  Informed Consent: I have reviewed the patients History and Physical, chart, labs and discussed the procedure including the risks, benefits and alternatives for the proposed anesthesia with the patient or authorized representative who has indicated his/her understanding and acceptance.     Dental advisory given  Plan Discussed with: CRNA  Anesthesia Plan Comments:         Anesthesia Quick Evaluation

## 2018-11-09 NOTE — Progress Notes (Signed)
Initial Nutrition Assessment  RD working remotely.   DOCUMENTATION CODES:   Not applicable  INTERVENTION:  - diet advancement as medically feasible. - will order Ensure Enlive once/day when diet advanced to at least FLD, each supplement provides 350 kcal and 20 grams of protein. - will order daily multivitamin with minerals.   NUTRITION DIAGNOSIS:   Inadequate oral intake related to inability to eat as evidenced by NPO status.  GOAL:   Patient will meet greater than or equal to 90% of their needs  MONITOR:   Diet advancement, PO intake, Labs, Weight trends, Skin  REASON FOR ASSESSMENT:   Malnutrition Screening Tool  ASSESSMENT:   85 y.o. male with medical history significant of HTN, CKD stage 3, memory loss, and cognitive dysfunction worsened over the past 6 months. He presented to the ED following a mechanical the morning of 6/13. He does not recall the circumstances surrounding the fall but does not think he hit his head or lost consciousness. Wife reported patient has significant short-term memory loss. She reported he is very physically active, mows the lawn, and does all of the household activities around the house.  Patient in PACU following L hip surgery d/t L hip fx following mechanical fall. No weight recorded in chart so unable to estimate nutrition needs. No height recorded so unable to determine IBW.   Notes state patient is at risk of post-op delirium and that patient may have early stages of senile dementia--plan to check vitamin B12 and TSH.    Medications reviewed. Labs reviewed; BUN: 35 mg/dl, creatinine: 1.76 mg/dl, Ca: 8.4 mg/dl, GFR: 34 ml/min.      NUTRITION - FOCUSED PHYSICAL EXAM:  unable to complete at this time.   Diet Order:   Diet Order            Diet NPO time specified  Diet effective midnight              EDUCATION NEEDS:   No education needs have been identified at this time  Skin:  Skin Assessment: Skin Integrity  Issues: Skin Integrity Issues:: Incisions Incisions: L hip  Last BM:  PTA/unknown  Height:   Ht Readings from Last 1 Encounters:  No data found for Ht    Weight:   Wt Readings from Last 1 Encounters:  No data found for Wt    Ideal Body Weight:     BMI:  There is no height or weight on file to calculate BMI.  Estimated Nutritional Needs:   Kcal:     Protein:     Fluid:        Jarome Matin, MS, RD, LDN, CNSC Inpatient Clinical Dietitian Pager # [phone] After hours/weekend pager # [phone]

## 2018-11-09 NOTE — Progress Notes (Signed)
Left hip fracture

## 2018-11-09 NOTE — Progress Notes (Signed)
PROGRESS NOTE    Anthony Hamilton  EUM:[id number] DOB: [birth year] DOA: 11/08/2018 PCP: Seward Carol, MD (Confirm with patient/family/NH records and if not entered, this HAS to be entered at [facility] [place] point of entry. "No PCP" if truly none.)   Brief Narrative: Mr. Kamel presented on 11/08/2018 emergency department after a fall at home.  He sustained a fracture of the hip.  Patient has been a very active gentleman.  Medically stable except for some mild dementia.  He was seen in consultation by orthopedics was deemed a candidate for hip repair and was taken to the operating room on 11/09/2018 where he underwent a left hemiarthroplasty without complications.   Assessment & Plan:   Active Problems:   Essential hypertension   Hip fracture, unspecified laterality, closed, initial encounter ([place])   Cognitive decline   Hip fracture ([place])   1.  Left hip fracture-patient status post left hemiarthroplasty for neck fracture.  He appears stable postop. Plan distractions, OT instructions per orthopedics  Aspirin 325 mg daily for DVT prophylaxis postop    2.  Hypertension-patient blood pressure is stable.  He will continue his current medications  3.  Cognitive decline -patient lacks insight into his condition and seems to be mildly confused.  No history was intact.  4. Leukocytosis - likely reractive PLan CBC in AM   DVT prophylaxis: ASA Code Status:DNR ( Family Communication: Spoke with patient's 5.  She is very concerned about the patient's confusion  Disposition Plan: SNF -11/11/2018 (specify when and where you expect patient to be discharged). Include barriers to DC in this tab.   Consultants:   Dr. Griffin Basil for orthopedics  Procedures: (Don't include imaging studies which can be auto populated. Include things that cannot be auto populated i.e. Echo, Carotid and venous dopplers, Foley, Bipap, HD, tubes/drains, wound vac, central lines etc)  Left hemiarthroplasty  Antimicrobials:  (specify start and planned stop date. Auto populated tables are space occupying and do not give end dates)  none    Subjective: Confused. Denies any pain.   Objective: Vitals:   11/09/18 1130 11/09/18 1145 11/09/18 1223 11/09/18 1939  BP: 114/68 122/73 119/64 133/79  Pulse: 85 84 81 97  Resp: 16 13  16   Temp:    97.6 F (36.4 C)  TempSrc:    Oral  SpO2: 97% 97% (!) 80% 94%    Intake/Output Summary (Last 24 hours) at 11/09/2018 2042 Last data filed at 11/09/2018 1544 Gross per 24 hour  Intake 820 ml  Output 280 ml  Net 540 ml   There were no vitals filed for this visit.  Examination:  General exam: Appears calm and comfortable, specifically denies pain. He is confused with poor insight to his accident and surgery  Respiratory system: Clear to auscultation. Respiratory effort normal. Cardiovascular system: S1 & S2 heard, RRR. No JVD, murmurs, rubs, gallops or clicks. No pedal edema. Gastrointestinal system: Abdomen is nondistended, soft and nontender. No organomegaly or masses felt. Normal bowel sounds heard. Central nervous system: Awake, ortiented to person. No focal neurological deficits. Extremities: SymmetricDid not test strength Skin: No rashes, lesions or ulcers. Operative dressing left hip - clean and dry. Psychiatry: Mildly confused. Lacks insight.. Mood & affect appropriate.     Data Reviewed: I have personally reviewed following labs and imaging studies  CBC: Recent Labs  Lab 11/08/18 1105 11/09/18 0338 11/09/18 1141  WBC 14.5* 13.1* 15.2*  NEUTROABS 13.2*  --   --   HGB 13.2 12.5* 11.8*  HCT 39.5  37.4* 35.0*  MCV 89.8 89.3 88.6  PLT 111* 135* 811*   Basic Metabolic Panel: Recent Labs  Lab 11/08/18 1105 11/09/18 0338 11/09/18 1141  NA 138 139  --   K 3.9 3.6  --   CL 106 106  --   CO2 21* 24  --   GLUCOSE 127* 186*  --   BUN 31* 35*  --   CREATININE 1.43* 1.76* 1.76*  CALCIUM 8.4* 8.4*  --    GFR: CrCl cannot be calculated (Unknown ideal  weight.). Liver Function Tests: No results for input(s): AST, ALT, ALKPHOS, BILITOT, PROT, ALBUMIN in the last 168 hours. No results for input(s): LIPASE, AMYLASE in the last 168 hours. No results for input(s): AMMONIA in the last 168 hours. Coagulation Profile: Recent Labs  Lab 11/08/18 1105  INR 1.0   Cardiac Enzymes: No results for input(s): CKTOTAL, CKMB, CKMBINDEX, TROPONINI in the last 168 hours. BNP (last 3 results) No results for input(s): PROBNP in the last 8760 hours. HbA1C: No results for input(s): HGBA1C in the last 72 hours. CBG: No results for input(s): GLUCAP in the last 168 hours. Lipid Profile: No results for input(s): CHOL, HDL, LDLCALC, TRIG, CHOLHDL, LDLDIRECT in the last 72 hours. Thyroid Function Tests: Recent Labs    11/08/18 1345  TSH 0.341*   Anemia Panel: Recent Labs    11/08/18 1345  VITAMINB12 389   Sepsis Labs: No results for input(s): PROCALCITON, LATICACIDVEN in the last 168 hours.  Recent Results (from the past 240 hour(s))  SARS Coronavirus 2 (CEPHEID - Performed in [place] hospital lab), Hosp Order     Status: None   Collection Time: 11/08/18 12:08 PM   Specimen: Nasopharyngeal Swab  Result Value Ref Range Status   SARS Coronavirus 2 NEGATIVE NEGATIVE Final    Comment: (NOTE) If result is NEGATIVE SARS-CoV-2 target nucleic acids are NOT DETECTED. The SARS-CoV-2 RNA is generally detectable in upper and lower  respiratory specimens during the acute phase of infection. The lowest  concentration of SARS-CoV-2 viral copies this assay can detect is 250  copies / mL. A negative result does not preclude SARS-CoV-2 infection  and should not be used as the sole basis for treatment or other  patient management decisions.  A negative result may occur with  improper specimen collection / handling, submission of specimen other  than nasopharyngeal swab, presence of viral mutation(s) within the  areas targeted by this assay, and inadequate  number of viral copies  (<250 copies / mL). A negative result must be combined with clinical  observations, patient history, and epidemiological information. If result is POSITIVE SARS-CoV-2 target nucleic acids are DETECTED. The SARS-CoV-2 RNA is generally detectable in upper and lower  respiratory specimens dur ing the acute phase of infection.  Positive  results are indicative of active infection with SARS-CoV-2.  Clinical  correlation with patient history and other diagnostic information is  necessary to determine patient infection status.  Positive results do  not rule out bacterial infection or co-infection with other viruses. If result is PRESUMPTIVE POSTIVE SARS-CoV-2 nucleic acids MAY BE PRESENT.   A presumptive positive result was obtained on the submitted specimen  and confirmed on repeat testing.  While 2019 novel coronavirus  (SARS-CoV-2) nucleic acids may be present in the submitted sample  additional confirmatory testing may be necessary for epidemiological  and / or clinical management purposes  to differentiate between  SARS-CoV-2 and other Sarbecovirus currently known to infect humans.  If clinically  indicated additional testing with an alternate test  methodology [phone]) is advised. The SARS-CoV-2 RNA is generally  detectable in upper and lower respiratory sp ecimens during the acute  phase of infection. The expected result is Negative. Fact Sheet for Patients:  StrictlyIdeas.no Fact Sheet for Healthcare Providers: BankingDealers.co.za This test is not yet approved or cleared by the [place] FDA and has been authorized for detection and/or diagnosis of SARS-CoV-2 by FDA under an Emergency Use Authorization (EUA).  This EUA will remain in effect (meaning this test can be used) for the duration of the COVID-19 declaration under Section 564(b)(1) of the Act, 21 U.S.C. section 360bbb-3(b)(1), unless the  authorization is terminated or revoked sooner. Performed at [facility], [place] [address], [place], [place] [address]   Surgical PCR screen     Status: None   Collection Time: 11/08/18 10:18 PM   Specimen: Nasal Mucosa; Nasal Swab  Result Value Ref Range Status   MRSA, PCR NEGATIVE NEGATIVE Final   Staphylococcus aureus NEGATIVE NEGATIVE Final    Comment: (NOTE) The Xpert SA Assay (FDA approved for NASAL specimens in patients 60 years of age and older), is one component of a comprehensive surveillance program. It is not intended to diagnose infection nor to guide or monitor treatment. Performed at [facility], [place] [address], [place],  [address]          Radiology Studies: Dg Chest 1 View  Result Date: 11/08/2018 CLINICAL DATA:  Fall today, left hip fracture. EXAM: CHEST  1 VIEW COMPARISON:  12/02/2006 FINDINGS: Mild enlargement of the cardiopericardial silhouette. Atherosclerotic calcification of the aortic arch. The patient is rotated to the left on today's radiograph, reducing diagnostic sensitivity and specificity. The lungs appear clear. No blunting of the costophrenic angles. Dextroconvex thoracic scoliosis. IMPRESSION: 1. Mild enlargement of the cardiopericardial silhouette, without edema. 2.  Aortic Atherosclerosis (ICD10-I70.0). Electronically Signed   By: Van Clines M.D.   On: 11/08/2018 12:15   Pelvis Portable  Result Date: 11/09/2018 CLINICAL DATA:  Post LEFT hip surgery EXAM: PORTABLE PELVIS 1-2 VIEWS COMPARISON:  Portable exam 1117 hours compared to 11/08/2018 FINDINGS: Interval resection of LEFT femoral head and placement of a LEFT femoral prosthesis. No dislocation. Patient's LEFT hand is superimposed with the proximal femur. No definite acute femoral abnormalities identified. Pelvis appears intact. IMPRESSION: LEFT hip prosthesis without definite acute abnormalities. Electronically Signed   By: Lavonia Dana M.D.    On: 11/09/2018 13:35   Dg Hip Unilat With Pelvis 2-3 Views Left  Result Date: 11/08/2018 CLINICAL DATA:  Left hip pain, fall EXAM: DG HIP (WITH OR WITHOUT PELVIS) 2-3V LEFT COMPARISON:  None. FINDINGS: There is an acute angulated displaced left hip subcapital femoral neck fracture. No associated hip dislocation. Diffuse soft tissue swelling. Bony pelvis and right hip intact. IMPRESSION: Acute displaced left hip subcapital femoral neck fracture. Electronically Signed   By: Jerilynn Mages.  Shick M.D.   On: 11/08/2018 12:16        Scheduled Meds: . acetaminophen  1,000 mg Oral Q8H  . docusate sodium  100 mg Oral BID  . [START ON 11/10/2018] enoxaparin (LOVENOX) injection  40 mg Subcutaneous Q24H  . feeding supplement (ENSURE ENLIVE)  237 mL Oral BID BM  . multivitamin with minerals  1 tablet Oral Daily  . mupirocin ointment  1 application Nasal BID   Continuous Infusions: . lactated ringers 10 mL/hr at 11/09/18 0831     LOS: 1 day    Time  spent: 25 min    Adella Hare, MD [facility] Pager [phone]  If 7PM-7AM, please contact night-coverage [email or web address] Password [facility] 11/09/2018, 8:42 PM

## 2018-11-09 NOTE — Plan of Care (Signed)

## 2018-11-09 NOTE — Transfer of Care (Signed)
Immediate Anesthesia Transfer of Care Note  Patient: Anthony Hamilton  Procedure(s) Performed: ARTHROPLASTY BIPOLAR HIP (HEMIARTHROPLASTY) (Left Hip)  Patient Location: PACU  Anesthesia Type:General  Level of Consciousness: awake, alert , oriented and drowsy  Airway & Oxygen Therapy: Patient Spontanous Breathing  Post-op Assessment: Report given to RN and Post -op Vital signs reviewed and stable  Post vital signs: Reviewed and stable  Last Vitals:  Vitals Value Taken Time  BP 140/91 11/09/18 1029  Temp    Pulse 95 11/09/18 1030  Resp 18 11/09/18 1030  SpO2 100 % 11/09/18 1030  Vitals shown include unvalidated device data.  Last Pain:  Vitals:   11/09/18 0512  TempSrc: Oral  PainSc:          Complications: No apparent anesthesia complications

## 2018-11-09 NOTE — Anesthesia Postprocedure Evaluation (Signed)
Anesthesia Post Note  Patient: Anthony Hamilton  Procedure(s) Performed: ARTHROPLASTY BIPOLAR HIP (HEMIARTHROPLASTY) (Left Hip)     Patient location during evaluation: PACU Anesthesia Type: General Level of consciousness: awake and alert Pain management: pain level controlled Vital Signs Assessment: post-procedure vital signs reviewed and stable Respiratory status: spontaneous breathing, nonlabored ventilation, respiratory function stable and patient connected to nasal cannula oxygen Cardiovascular status: blood pressure returned to baseline and stable Postop Assessment: no apparent nausea or vomiting Anesthetic complications: no    Last Vitals:  Vitals:   11/09/18 1030 11/09/18 1045  BP: (!) 140/91 137/71  Pulse: 97 87  Resp: 18 18  Temp: 36.4 C   SpO2: 100% 99%    Last Pain:  Vitals:   11/09/18 1030  TempSrc:   PainSc: Asleep                 Tyniesha Howald,W. EDMOND

## 2018-11-09 NOTE — Discharge Instructions (Signed)
Lynell Greenhouse MD, MPH °[facility] °[address], [address] [phone] (tel)   °[phone] (fax) ° °POST-OPERATIVE INSTRUCTIONS ° °WOUND CARE °? You may remove the operative dressing 7 days postop and leave open to air afterwards °? KEEP THE INCISIONS CLEAN AND DRY. °? You may shower on Post-Op Day #2. Gently pat the area dry.  The dressing is waterproof. Do not soak the knee in water. Do not go swimming in the pool or ocean until 4 weeks after surgery or when otherwise instructed. ° °EXERCISES °? You may bear weight as tolerated on your operative extremity. °? When in bed and while sleeping it is imperative you wear your knee immobilizer until we instruct you otherwise while in clinic. °? It is essential that you follow your posterior hip precautions.  °o Do not sit in a low chair. (No hip flexion >90 degrees) °o Do not cross your legs.  (No Adduction past neutral) °o Keep your toes pointed straight ahead at all times (No internal rotation or excessive external rotation) °? Therapy may be performed by an outpatient therapist or at a rehab facility ° °FOLLOW-UP °? If you develop a Fever (>101.5), Redness or Drainage from the surgical incision site, please call our office to arrange for an evaluation. °? Please call the office to schedule a follow-up appointment for your suture removal, 10-14 days post-operatively. °IF YOU HAVE ANY QUESTIONS, PLEASE FEEL FREE TO CALL OUR OFFICE. ° ° °

## 2018-11-09 NOTE — Anesthesia Procedure Notes (Signed)
Procedure Name: Intubation Date/Time: 11/09/2018 8:59 AM Performed by: Trinna Post., CRNA Pre-anesthesia Checklist: Patient identified, Emergency Drugs available, Suction available, Patient being monitored and Timeout performed Patient Re-evaluated:Patient Re-evaluated prior to induction Oxygen Delivery Method: Circle system utilized Preoxygenation: Pre-oxygenation with 100% oxygen Induction Type: IV induction, Rapid sequence and Cricoid Pressure applied Ventilation: Mask ventilation without difficulty Laryngoscope Size: Mac and 4 Grade View: Grade I Tube type: Oral Tube size: 7.5 mm Number of attempts: 1 Airway Equipment and Method: Stylet Placement Confirmation: ETT inserted through vocal cords under direct vision,  positive ETCO2 and breath sounds checked- equal and bilateral Secured at: 23 cm Tube secured with: Tape Dental Injury: Teeth and Oropharynx as per pre-operative assessment

## 2018-11-09 NOTE — Progress Notes (Signed)
Patient arrived from PACU. Patient is  alert, confused and agiatated. Wife at bedside. Patient removed abduction pillow and  pulled dressing on Lt hip, dressing reinforced with tape. Patient attempt to get out of bed multiple times, Tech assigned to patient sitting at bedside. MD notified and order received for a safety sitter. Will cont to monitor.

## 2018-11-09 NOTE — Op Note (Signed)
Orthopaedic Surgery Operative Note (CSN: [id number])  KYIAN OBST  [birth year] Date of Surgery: 11/08/2018 - 11/09/2018   Diagnoses:  left femoral neck fracture  Procedure: Left hip hemiarthroplasty 27236   Operative Finding Successful completion of planned procedure.  Stability of the hip at 90 degrees of flexion abduction and internal rotation to 75 degrees good length.  Post-operative plan: The patient will be weightbearing as tolerated with posterior hip precautions.  The patient will be readmitted to floor.  DVT prophylaxis Lovenox 40 mg a day.  Pain control with PRN pain medication preferring oral medicines.  Follow up plan will be scheduled in approximately 7 days for incision check and XR.  Dressing can be removed by facility and Steri-Strips reapplied 7 days postop if he goes to facility.  Implant: 52 mm head, 0 neck, Depuy basic Summit stem  Post-Op Diagnosis: Same Surgeons:Primary: Hiram Gash, MD Assistants: Joya Gaskins, OPAC Location: [facility] OR ROOM 05 Anesthesia: General Antibiotics: Ancef 2g preop, Vancomycin 1000mg  locally  Tourniquet time: * No tourniquets in log * Estimated Blood Loss: 294 Complications: None Specimens: None Implants: Implant Name Type Inv. Item Serial No. Manufacturer Lot No. LRB No. Used Action  SPACER TAPER DEPUY - [id number] Hips SPACER TAPER DEPUY  DEPUY SYNTHES [id number] Left 1 Implanted  HIP BALL DEPUY - [id number] Hips HIP BALL DEPUY  DEPUY SYNTHES [id number] Left 1 Implanted  STEM SUMMIT BASIC PRESSFIT SZ5 - [id number] Hips STEM SUMMIT BASIC PRESSFIT SZ5  DEPUY SYNTHES [id number] Left 1 Implanted    Indications for Surgery:   Anthony Hamilton is a 85 y.o. male with fall resulting in a displaced left femoral neck fracture.  Patient was unable to consent for himself fully and we discussed the case with his wife.  Benefits and risks of operative and nonoperative management were discussed prior to surgery with patient/guardian(s) and  informed consent form was completed.  Specific risks including infection, need for additional surgery, dislocation, infection, pain, periprosthetic fracture.   Procedure:   The patient was identified in the preoperative holding area where the surgical site was marked. The patient was taken to the OR where a procedural timeout was called and the above noted anesthesia was induced.  The patient was positioned lateral with a marked to positioner.  Preoperative antibiotics were dosed.  The patient's left hip was prepped and draped in the usual sterile fashion.  A second preoperative timeout was called.      We made an incision centered over the greater trochanter with a scalpel. We used the scalpel to continue to dissect to the fascia. The fascia was pierced with Bovie electrocautery. Mayo scissors were used to cut the fascia in a longitudinal fashion. The gluteus maximus fibers were bluntly split in line with their fibers.   The femur was slowly internally rotated, putting tension on the posterior structures. Bovie electrocautery was used to dissect the short external rotators off of the insertion onto the femur. After the short external rotators were transected, we visualized the femoral neck fracture. We identified the sciatic nerve by palpation and verified that it was not in danger from dissection.   We made a T-shaped capsulotomy. We made a neck cut approximately 2 cm above the lesser trochanter with a a reciprocating saw. We removed the femoral head. We used the box cutter to cut away some of the greater trochanter for ease of insertion of the stem. We then used the canal finder to locate the femoral canal.   Using the  angle of the femoral neck as our guide for version, we broached sequentially. We trialed components and found appropriate fit and stability.  The patient would come to full extension of the hip. The hip was stable at above-noted measurements.  We then removed the trials as well as the  broach. We ensured there were no foreign bodies or bone within the acetabulum. We copiously irrigated the wound.  We then carefully placed our stem, size listed above before assembling the head and neck together onto the stem. We then reduced the hip. Again, that was stable in the previously mentioned manipulations.   We repaired the posterior capsule taking care to both repair the T split individual #2 FiberWire sutures.  Short external rotators repaired to the greater trochanter to bone tunnels. We closed the fascia of the iliotibial band and gluteus maximus with running and intterupted Vicryl sutures. We then closed Scarpa's fascia with running Vicryl sutures. Skin was closed with 3-0 Vicryl and 4-0 Monocryl with Steri-Strips and Aquasol dressing was placed.   Joya Gaskins, OPA-C, present and scrubbed throughout the case, critical for completion in a timely fashion, and for retraction, instrumentation, closure.

## 2018-11-09 NOTE — OR Nursing (Signed)
Gave patient's wedding ring to wife in waiting room. Container was labeled and wife confirmed husband's first and last name and date of birth.

## 2018-11-10 ENCOUNTER — Encounter (HOSPITAL_COMMUNITY): Payer: Self-pay | Admitting: *Deleted

## 2018-11-10 DIAGNOSIS — S72002D Fracture of unspecified part of neck of left femur, subsequent encounter for closed fracture with routine healing: Secondary | ICD-10-CM

## 2018-11-10 LAB — CBC
HCT: 29.9 % — ABNORMAL LOW (ref 39.0–52.0)
Hemoglobin: 10.3 g/dL — ABNORMAL LOW (ref 13.0–17.0)
MCH: 29.5 pg (ref 26.0–34.0)
MCHC: 34.4 g/dL (ref 30.0–36.0)
MCV: 85.7 fL (ref 80.0–100.0)
Platelets: 127 10*3/uL — ABNORMAL LOW (ref 150–400)
RBC: 3.49 MIL/uL — ABNORMAL LOW (ref 4.22–5.81)
RDW: 13.4 % (ref 11.5–15.5)
WBC: 12.3 10*3/uL — ABNORMAL HIGH (ref 4.0–10.5)
nRBC: 0 % (ref 0.0–0.2)

## 2018-11-10 LAB — BASIC METABOLIC PANEL
Anion gap: 9 (ref 5–15)
BUN: 42 mg/dL — ABNORMAL HIGH (ref 8–23)
CO2: 22 mmol/L (ref 22–32)
Calcium: 8.3 mg/dL — ABNORMAL LOW (ref 8.9–10.3)
Chloride: 108 mmol/L (ref 98–111)
Creatinine, Ser: 2.2 mg/dL — ABNORMAL HIGH (ref 0.61–1.24)
GFR calc Af Amer: 30 mL/min — ABNORMAL LOW (ref >60)
GFR calc non Af Amer: 26 mL/min — ABNORMAL LOW (ref >60)
Glucose, Bld: 149 mg/dL — ABNORMAL HIGH (ref 70–99)
Potassium: 3.7 mmol/L (ref 3.5–5.1)
Sodium: 139 mmol/L (ref 135–145)

## 2018-11-10 LAB — T4, FREE: Free T4: 1.99 ng/dL — ABNORMAL HIGH (ref 0.61–1.12)

## 2018-11-10 MED ORDER — ACETAMINOPHEN 500 MG PO TABS: 1000.0000 mg | ORAL_TABLET | Freq: Three times a day (TID) | ORAL | 0 refills | Status: AC

## 2018-11-10 MED ORDER — HALOPERIDOL LACTATE 5 MG/ML IJ SOLN
1.0000 mg | Freq: Three times a day (TID) | INTRAMUSCULAR | Status: DC | PRN
  Administered 2018-11-11: 1 mg via INTRAVENOUS
  Filled 2018-11-10: qty 1

## 2018-11-10 MED ORDER — ENOXAPARIN SODIUM 40 MG/0.4ML ~~LOC~~ SOLN: 40.0000 mg | SUBCUTANEOUS | 1 refills | Status: DC

## 2018-11-10 MED ORDER — QUETIAPINE FUMARATE 25 MG PO TABS
25.0000 mg | ORAL_TABLET | Freq: Every day | ORAL | Status: DC
  Administered 2018-11-10: 25 mg via ORAL
  Filled 2018-11-10 (×2): qty 1

## 2018-11-10 MED ORDER — OXYCODONE HCL 5 MG PO TABS: ORAL_TABLET | ORAL | 0 refills | Status: DC

## 2018-11-10 MED ORDER — ONDANSETRON HCL 4 MG PO TABS: 4.0000 mg | ORAL_TABLET | Freq: Three times a day (TID) | ORAL | 1 refills | Status: AC | PRN

## 2018-11-10 NOTE — Progress Notes (Signed)
[name] Kitchen  PROGRESS NOTE    Anthony Hamilton  XNA:[id number] DOB: 1927/06/17 DOA: 11/08/2018 PCP: Seward Carol, MD   Brief Narrative:   Anthony Hamilton is a 85 y.o. male with medical history significant of hypertension, CKD 3, memory loss and cognitive dysfunction worsened over the past 6 months presented to the emergency room following a fall this morning. -Patient reports waking up to use the bathroom following which she was walking back and fell down, he does not remember the circumstances around his fall very clearly, does not think he lost consciousness.  His wife woke up with the tight sound associated with his fall, he was awake and alert at that time according to her but unable to get off the floor. - Wife reports that he has been in his usual state of health this week has significant short-term memory deficits but otherwise was at baseline until the events of this morning. - Wife reports that he is physically very active, mows the lawn, does all the household activities around the house - He was seen in consultation by orthopedics was deemed a candidate for hip repair and was taken to the operating room on 11/09/2018 where he underwent a left hemiarthroplasty without complications.   Assessment & Plan:   Active Problems:   Essential hypertension   Hip fracture, unspecified laterality, closed, initial encounter ([place])   Cognitive decline   Hip fracture ([place])   Left hip fracture      - s/p left hemiarthroplasty for neck fracture.     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   Objective: Vitals:   11/09/18 1939 11/09/18 2000 11/10/18 0616 11/10/18 0812  BP: 133/79  (!) 148/70 (!) 148/73  Pulse: 97  74 87  Resp: 16  16 18   Temp: 97.6 F (36.4 C)  97.6 F (36.4 C) 98.4 F (36.9 C)  TempSrc: Oral  Oral Oral  SpO2: 94%  92% 100%  Weight:  72.2 kg    Height:  5\' 7"  (1.702 m)      Intake/Output Summary (Last 24 hours) at 11/10/2018 1412 Last data filed at 11/10/2018 1202 Gross per 24 hour  Intake 768.67 ml  Output 600 ml  Net 168.67 ml   Filed Weights   11/09/18 2000  Weight: 72.2 kg    Examination:  General: 85 y.o. male resting in bed in NAD Cardiovascular: RRR, +S1, S2, no m/g/r, equal pulses throughout Respiratory: CTABL, no w/r/r, normal WOB GI: BS+, NDNT, no masses noted, no organomegaly noted MSK: No e/c/c Skin: No rashes, bruises, ulcerations noted Neuro: alert to name only, calm, cooperative   Data Reviewed: I have personally reviewed following labs and imaging studies.  CBC: Recent Labs  Lab 11/08/18 1105 11/09/18 0338 11/09/18 1141 11/10/18 0508  WBC 14.5* 13.1* 15.2* 12.3*  NEUTROABS 13.2*  --   --   --   HGB 13.2 12.5* 11.8* 10.3*  HCT 39.5 37.4* 35.0* 29.9*  MCV 89.8 89.3 88.6  85.7  PLT 111* 135* 131* 678*   Basic Metabolic Panel: Recent Labs  Lab 11/08/18 1105 11/09/18 0338 11/09/18 1141 11/10/18 0508  NA 138 139  --  139  K 3.9 3.6  --  3.7  CL 106 106  --  108  CO2 21* 24  --  22  GLUCOSE 127* 186*  --  149*  BUN 31* 35*  --  42*  CREATININE 1.43* 1.76* 1.76* 2.20*  CALCIUM 8.4* 8.4*  --  8.3*   GFR: Estimated Creatinine Clearance: 21.7 mL/min (A) (by C-G formula based on SCr of 2.2 mg/dL (H)). Liver Function Tests: No results for input(s): AST, ALT, ALKPHOS, BILITOT, PROT, ALBUMIN in the last 168 hours. No results for input(s): LIPASE, AMYLASE in the last 168 hours. No results for input(s): AMMONIA in the last 168 hours. Coagulation Profile: Recent Labs  Lab 11/08/18 1105  INR 1.0   Cardiac  Enzymes: No results for input(s): CKTOTAL, CKMB, CKMBINDEX, TROPONINI in the last 168 hours. BNP (last 3 results) No results for input(s): PROBNP in the last 8760 hours. HbA1C: No results for input(s): HGBA1C in the last 72 hours. CBG: No results for input(s): GLUCAP in the last 168 hours. Lipid Profile: No results for input(s): CHOL, HDL, LDLCALC, TRIG, CHOLHDL, LDLDIRECT in the last 72 hours. Thyroid Function Tests: Recent Labs    11/08/18 1345  TSH 0.341*   Anemia Panel: Recent Labs    11/08/18 1345  VITAMINB12 389   Sepsis Labs: No results for input(s): PROCALCITON, LATICACIDVEN in the last 168 hours.  Recent Results (from the past 240 hour(s))  SARS Coronavirus 2 (CEPHEID - Performed in [place] hospital lab), Hosp Order     Status: None   Collection Time: 11/08/18 12:08 PM   Specimen: Nasopharyngeal Swab  Result Value Ref Range Status   SARS Coronavirus 2 NEGATIVE NEGATIVE Final    Comment: (NOTE) If result is NEGATIVE SARS-CoV-2 target nucleic acids are NOT DETECTED. The SARS-CoV-2 RNA is generally detectable in upper and lower  respiratory specimens during the acute phase of infection. The lowest  concentration of SARS-CoV-2 viral copies this assay can detect is 250  copies / mL. A negative result does not preclude SARS-CoV-2 infection  and should not be used as the sole basis for treatment or other  patient management decisions.  A negative result may occur with  improper specimen collection / handling, submission of specimen other  than nasopharyngeal swab, presence of viral mutation(s) within the  areas targeted by this assay, and inadequate number of viral copies  (<250 copies / mL). A negative result must be combined with clinical  observations, patient history, and epidemiological information. If result is POSITIVE SARS-CoV-2 target nucleic acids are DETECTED. The SARS-CoV-2 RNA is generally detectable in upper and lower  respiratory specimens dur ing  the acute phase of infection.  Positive  results are indicative of active infection with SARS-CoV-2.  Clinical  correlation with patient history and other diagnostic information is  necessary to determine patient infection status.  Positive results do  not rule out bacterial infection or co-infection with other viruses. If result is PRESUMPTIVE POSTIVE SARS-CoV-2 nucleic acids MAY BE PRESENT.   A presumptive positive result was obtained on the submitted specimen  and confirmed on repeat testing.  While 2019 novel coronavirus  (SARS-CoV-2) nucleic acids may be present in the submitted sample  additional confirmatory testing may be necessary for epidemiological  and / or clinical management purposes  to differentiate  between  SARS-CoV-2 and other Sarbecovirus currently known to infect humans.  If clinically indicated additional testing with an alternate test  methodology [id number]) is advised. The SARS-CoV-2 RNA is generally  detectable in upper and lower respiratory sp ecimens during the acute  phase of infection. The expected result is Negative. Fact Sheet for Patients:  StrictlyIdeas.no Fact Sheet for Healthcare Providers: BankingDealers.co.za This test is not yet approved or cleared by the [place] FDA and has been authorized for detection and/or diagnosis of SARS-CoV-2 by FDA under an Emergency Use Authorization (EUA).  This EUA will remain in effect (meaning this test can be used) for the duration of the COVID-19 declaration under Section 564(b)(1) of the Act, 21 U.S.C. section 360bbb-3(b)(1), unless the authorization is terminated or revoked sooner. Performed at [facility], [place] [address], [place], [place] [address]   Surgical PCR screen     Status: None   Collection Time: 11/08/18 10:18 PM   Specimen: Nasal Mucosa; Nasal Swab  Result Value Ref Range Status   MRSA, PCR NEGATIVE NEGATIVE Final    Staphylococcus aureus NEGATIVE NEGATIVE Final    Comment: (NOTE) The Xpert SA Assay (FDA approved for NASAL specimens in patients 12 years of age and older), is one component of a comprehensive surveillance program. It is not intended to diagnose infection nor to guide or monitor treatment. Performed at [facility], [place] [address] [place], [place] [address]   Novel Coronavirus, NAA (hospital order; send-out to ref lab)     Status: None   Collection Time: 11/09/18  3:35 AM   Specimen: Nasopharyngeal Swab; Respiratory  Result Value Ref Range Status   SARS-CoV-2, NAA NOT DETECTED NOT DETECTED Final    Comment: (NOTE) This test was developed and its performance characteristics determined by [employer]. This test has not been FDA cleared or approved. This test has been authorized by FDA under an Emergency Use Authorization (EUA). This test is only authorized for the duration of time the declaration that circumstances exist justifying the authorization of the emergency use of in vitro diagnostic tests for detection of SARS-CoV-2 virus and/or diagnosis of COVID-19 infection under section 564(b)(1) of the Act, 21 U.S.C. 940HWK-0(S)(8), unless the authorization is terminated or revoked sooner. When diagnostic testing is negative, the possibility of a false negative result should be considered in the context of a patient's recent exposures and the presence of clinical signs and symptoms consistent with COVID-19. An individual without symptoms of COVID-19 and who is not shedding SARS-CoV-2 virus would expect to have a negative (not detected) result in this assay. Performed  At: [facility] [place], [place] [id number] Rush Farmer MD OP:[id number]    [place]  Final    Comment: Performed at [place] [address], [place], [place] [address]     Radiology Studies: Pelvis Portable   Result Date: 11/09/2018 CLINICAL DATA:  Post LEFT hip surgery EXAM: PORTABLE PELVIS 1-2 VIEWS COMPARISON:  Portable exam 1117 hours compared to 11/08/2018 FINDINGS: Interval resection of LEFT femoral head and placement of a LEFT femoral prosthesis. No dislocation. Patient's LEFT hand is superimposed with the proximal femur. No definite acute femoral abnormalities identified. Pelvis appears intact. IMPRESSION: LEFT hip prosthesis without definite acute abnormalities. Electronically Signed   By: Lavonia Dana M.D.   On: 11/09/2018 13:35    Scheduled Meds: . acetaminophen  1,000 mg Oral Q8H  . docusate sodium  100 mg Oral BID  .  enoxaparin (LOVENOX) injection  40 mg Subcutaneous Q24H  . feeding supplement (ENSURE ENLIVE)  237 mL Oral BID BM  . multivitamin with minerals  1 tablet Oral Daily  . mupirocin ointment  1 application Nasal BID   Continuous Infusions: . lactated ringers 10 mL/hr at 11/09/18 0831     LOS: 2 days    Time spent: 25 minutes spent in the coordination of care today.    Jonnie Finner, DO [facility] Pager [phone]  If 7PM-7AM, please contact night-coverage [email or web address] Password [email or web address] 11/10/2018, 2:12 PM

## 2018-11-10 NOTE — Evaluation (Signed)
 Physical Therapy Evaluation Patient Details Name: Anthony Hamilton MRN: [id number] DOB: Nov 14, 1927 Today's Date: 83/15/2020   History of Present Illness  85 y.o. male with medical history significant of hypertension, CKD 3, memory loss and cognitive dysfunction worsened over the past 6 months. He presented to the emergency room following a fall. Xray revealed L hip fx. He underwent L hemiarthroplasty 11/09/18.    Clinical Impression  Pt admitted with above diagnosis. Pt currently with functional limitations due to the deficits listed below (see PT Problem List). PTA pt lived at home with his wife, independent with mobility. On eval, he required mod assist bed mobility, mod assist sit to stand, and mod assist sidestepping with RW.  Pt will benefit from skilled PT to increase their independence and safety with mobility to allow discharge to the venue listed below.       Follow Up Recommendations SNF;Supervision/Assistance - 24 hour    Equipment Recommendations  Rolling walker with 5" wheels    Recommendations for Other Services       Precautions / Restrictions Precautions Precautions: Posterior Hip;Fall Precaution Comments: abd pillow in bed Restrictions Weight Bearing Restrictions: Yes LLE Weight Bearing: Weight bearing as tolerated      Mobility  Bed Mobility Overal bed mobility: Needs Assistance Bed Mobility: Supine to Sit;Sit to Supine     Supine to sit: Mod assist;HOB elevated Sit to supine: Mod assist;HOB elevated   General bed mobility comments: +rail, increased time and effort, use of bed pad to scoot to EOB  Transfers Overall transfer level: Needs assistance Equipment used: Rolling walker (2 wheeled) Transfers: Sit to/from Stand Sit to Stand: Mod assist         General transfer comment: Sit to stand x 3 trials, cues for hand placement, assist to power up and stabilize balance  Ambulation/Gait Ambulation/Gait assistance: Mod assist   Assistive device:  Rolling walker (2 wheeled) Gait Pattern/deviations: Step-to pattern     General Gait Details: sidestepping at bedside with RW, cues for sequencing, assist to maintain balance and manage RW. Unable to progress past bedside due to confusion/safety and pain.  Stairs            Wheelchair Mobility    Modified Rankin (Stroke Patients Only)       Balance                                             Pertinent Vitals/Pain Pain Assessment: Faces Faces Pain Scale: Hurts even more Pain Location: LLE during mobility Pain Descriptors / Indicators: Grimacing;Guarding Pain Intervention(s): Limited activity within patient's tolerance;Monitored during session;Repositioned    Home Living Family/patient expects to be discharged to:: Skilled nursing facility Living Arrangements: Spouse/significant other                    Prior Function Level of Independence: Independent               Hand Dominance        Extremity/Trunk Assessment        Lower Extremity Assessment Lower Extremity Assessment: LLE deficits/detail LLE Deficits / Details: s/p hemiarthroplasty, posterior precautions    Cervical / Trunk Assessment Cervical / Trunk Assessment: Kyphotic  Communication   Communication: HOH  Cognition Arousal/Alertness: Awake/alert Behavior During Therapy: WFL for tasks assessed/performed Overall Cognitive Status: History of cognitive impairments - at baseline  General Comments: Oriented to self only. Polite and cooperative. Follows one step commands. Began crying at end of session. When asked if he was crying because of pain or if he was sad, he answered "both."      General Comments      Exercises     Assessment/Plan    PT Assessment Patient needs continued PT services  PT Problem List Decreased strength;Decreased mobility;Decreased safety awareness;Decreased activity tolerance;Pain;Decreased  balance;Decreased knowledge of use of DME       PT Treatment Interventions DME instruction;Therapeutic activities;Cognitive remediation;Gait training;Therapeutic exercise;Patient/family education;Balance training;Functional mobility training    PT Goals (Current goals can be found in the Care Plan section)  Acute Rehab PT Goals Patient Stated Goal: not stated PT Goal Formulation: Patient unable to participate in goal setting Time For Goal Achievement:  Potential to Achieve Goals: Fair    Frequency Min 3X/week   Barriers to discharge        Co-evaluation               AM-PAC PT "6 Clicks" Mobility  Outcome Measure Help needed turning from your back to your side while in a flat bed without using bedrails?: A Little Help needed moving from lying on your back to sitting on the side of a flat bed without using bedrails?: A Lot Help needed moving to and from a bed to a chair (including a wheelchair)?: A Lot Help needed standing up from a chair using your arms (e.g., wheelchair or bedside chair)?: A Lot Help needed to walk in hospital room?: A Lot Help needed climbing 3-5 steps with a railing? : Total 6 Click Score: 12    End of Session Equipment Utilized During Treatment: Gait belt Activity Tolerance: Patient tolerated treatment well Patient left: in bed;with nursing/sitter in room;with bed alarm set;with restraints reapplied Nurse Communication: Mobility status PT Visit Diagnosis: Other abnormalities of gait and mobility (R26.89);Pain;History of falling (Z91.81) Pain - Right/Left: Left Pain - part of body: Hip    Time: 9458-5929 PT Time Calculation (min) (ACUTE ONLY): 29 min   Charges:   PT Evaluation $PT Eval Moderate Complexity: 1 Mod PT Treatments $Therapeutic Activity: 8-22 mins        Lorrin Goodell, PT  Office # [phone] Pager [phone]   Lorriane Shire 11/10/2018, 10:59 AM

## 2018-11-10 NOTE — Progress Notes (Signed)
ORTHOPAEDIC PROGRESS NOTE  s/p Procedure(s): ARTHROPLASTY BIPOLAR HIP (HEMIARTHROPLASTY)  L  SUBJECTIVE: NAD, confused  OBJECTIVE: Pe: left lower extremity: incision CDI, abduction pillow in place, leg lengths equal, warm well perfused foot, intact EHL/TA/GSC  Vitals:   11/10/18 0616 11/10/18 0812  BP: (!) 148/70 (!) 148/73  Pulse: 74 87  Resp: 16 18  Temp: 97.6 F (36.4 C) 98.4 F (36.9 C)  SpO2: 92% 100%     ASSESSMENT: Anthony Hamilton is a 85 y.o. male doing well postoperatively.  PLAN: Weightbearing: WBAT LLE with posterior hip precautions Insicional and dressing care: OK to remove dressings 7 days postop, leave steri strips and leave open to air with dry gauze PRN Orthopedic device(s): Abduction pillow  Showering: PRN VTE prophylaxis: Lovenox 40mg  qd x6 weeks, if ambulating well will transition to aspirin in clinic Pain control: PRN meds, minimize narcs Follow - up plan: 2 weeks with XR in clinic Contact information:  Weekdays 8-5 Ophelia Charter MD [phone], After hours and holidays please check [email or web address] for group call information for [facility]

## 2018-11-10 NOTE — Plan of Care (Signed)
  Problem: Clinical Measurements: Goal: Ability to maintain clinical measurements within normal limits will improve Outcome: Progressing   Problem: Nutrition: Goal: Adequate nutrition will be maintained Outcome: Progressing   Problem: Elimination: Goal: Will not experience complications related to bowel motility Outcome: Progressing   Problem: Pain Managment: Goal: General experience of comfort will improve Outcome: Progressing   Problem: Safety: Goal: Ability to remain free from injury will improve Outcome: Progressing   Problem: Skin Integrity: Goal: Risk for impaired skin integrity will decrease Outcome: Progressing

## 2018-11-10 NOTE — Progress Notes (Signed)
Pt pulled out IV, no PIV access now, MD notified. Pt refused to eat lunch but took sips of ensure and water. Wife called and updated. Safety sitter at bedside, will continue to monitor.

## 2018-11-10 NOTE — TOC Initial Note (Signed)
Transition of Care [facility]) - Initial/Assessment Note    Patient Details  Name: Anthony Hamilton MRN: [id number] Date of Birth: 05-18-1928  Transition of Care [facility]) CM/SW Contact:    Alberteen Sam, [place] Phone Number: [phone] 11/10/2018, 12:12 PM  Clinical Narrative:                  CSW consulted with patient's wife [name] regarding PT recommendation of SNF, as patient is noted to be oriented X1 with dementia. Patient's wife in agreement with recommendation, reports no preferences as she is not familiar with facilities in the area. Patient's wife [name] would like CSW to send referrals to facilitys in [place] area and then review list with her for choice. CSW faxing out referrals, will update wife with bed offers.   Expected Discharge Plan: Skilled Nursing Facility Barriers to Discharge: Continued Medical Work up   Patient Goals and CMS Choice   CMS Medicare.gov Compare Post Acute Care list provided to:: Patient Represenative (must comment)([name] (spouse)) Choice offered to / list presented to : Spouse([name])  Expected Discharge Plan and Services Expected Discharge Plan: [place] Acute Care Choice: [place] Living arrangements for the past 2 months: Single Family Home Expected Discharge Date: 11/10/18                                    Prior Living Arrangements/Services Living arrangements for the past 2 months: Single Family Home Lives with:: Spouse Patient language and need for interpreter reviewed:: Yes Do you feel safe going back to the place where you live?: No   needs short term rehab before returning home  Need for Family Participation in Patient Care: Yes (Comment) Care giver support system in place?: Yes (comment)   Criminal Activity/Legal Involvement Pertinent to Current Situation/Hospitalization: No - Comment as needed  Activities of Daily Living Home Assistive Devices/Equipment: None ADL Screening  (condition at time of admission) Patient's cognitive ability adequate to safely complete daily activities?: No Is the patient deaf or have difficulty hearing?: No Does the patient have difficulty seeing, even when wearing glasses/contacts?: No Does the patient have difficulty concentrating, remembering, or making decisions?: Yes Patient able to express need for assistance with ADLs?: Yes Does the patient have difficulty dressing or bathing?: Yes Independently performs ADLs?: Yes (appropriate for developmental age) Communication: Needs assistance Does the patient have difficulty walking or climbing stairs?: Yes Weakness of Legs: Both Weakness of Arms/Hands: Both  Permission Sought/Granted Permission sought to share information with : Case Manager, Customer service manager, Family Supports Permission granted to share information with : Yes, Verbal Permission Granted  Share Information with NAME: [name]  Permission granted to share info w AGENCY: SNFs  Permission granted to share info w Relationship: wife  Permission granted to share info w Contact Information: [phone]  Emotional Assessment Appearance:: (unable to assess) Attitude/Demeanor/Rapport: Unable to Assess Affect (typically observed): Unable to Assess Orientation: : Oriented to Self Alcohol / Substance Use: Not Applicable Psych Involvement: No (comment)  Admission diagnosis:  Fall Patient Active Problem List   Diagnosis Date Noted  . Essential hypertension 11/08/2018  . Hip fracture, unspecified laterality, closed, initial encounter ([place]) 11/08/2018  . Cognitive decline 11/08/2018  . Hip fracture ([place]) 11/08/2018   PCP:  Seward Carol, MD Pharmacy:   [place], [place] - [address] [place] [place] [address] Phone:  [phone] Fax: [phone]     Social Determinants of Health (SDOH) Interventions    Readmission Risk Interventions No flowsheet  data found.

## 2018-11-10 NOTE — Progress Notes (Signed)
CSW acknowledges SNF consult, pending PT/OT recs.   Valeria Boza, LCSW [phone]  

## 2018-11-10 NOTE — NC FL2 (Signed)
[place] LEVEL OF CARE SCREENING TOOL     IDENTIFICATION  Patient Name: Anthony Hamilton Birthdate: June 25, 1927 Sex: male Admission Date (Current Location): 11/08/2018  [facility] and [place] Number:  Herbalist and Address:  [facility], [place] [address] [place],  [address]      Provider Number: [id number]  Attending Physician Name and Address:  Jonnie Finner, DO  Relative Name and Phone Number:  Chauncey Cruel [phone]    Current Level of Care: Hospital Recommended Level of Care: [place] Prior Approval Number:    Date Approved/Denied:   PASRR Number: [id number]  Discharge Plan: SNF    Current Diagnoses: Patient Active Problem List   Diagnosis Date Noted  . Essential hypertension 11/08/2018  . Hip fracture, unspecified laterality, closed, initial encounter ([place]) 11/08/2018  . Cognitive decline 11/08/2018  . Hip fracture ([place]) 11/08/2018    Orientation RESPIRATION BLADDER Height & Weight     Self  Normal External catheter, Incontinent Weight: 159 lb 2.8 oz (72.2 kg) Height:  5\' 7"  (170.2 cm)  BEHAVIORAL SYMPTOMS/MOOD NEUROLOGICAL BOWEL NUTRITION STATUS      Continent Diet(see discharge summary)  AMBULATORY STATUS COMMUNICATION OF NEEDS Skin   Extensive Assist(rolling walker) Verbally Other (Comment), Surgical wounds(ecchymosis on arms, left hip closed surgical incision)                       Personal Care Assistance Level of Assistance  Bathing, Feeding, Dressing, Total care Bathing Assistance: Limited assistance Feeding assistance: Limited assistance Dressing Assistance: Limited assistance Total Care Assistance: Maximum assistance   Functional Limitations Info  Sight, Hearing, Speech Sight Info: Adequate Hearing Info: Adequate Speech Info: Adequate    SPECIAL CARE FACTORS FREQUENCY  OT (By licensed OT), PT (By licensed PT)     PT Frequency: min 5x weekly OT Frequency: min  5x weekly            Contractures Contractures Info: Not present    Additional Factors Info  Code Status, Allergies Code Status Info: DNR Allergies Info: No Known Allergies           Current Medications (11/10/2018):  This is the current hospital active medication list Current Facility-Administered Medications  Medication Dose Route Frequency Provider Last Rate Last Dose  . acetaminophen (TYLENOL) tablet 1,000 mg  1,000 mg Oral Q8H Ophelia Charter T, MD   1,000 mg at 11/10/18 0755  . docusate sodium (COLACE) capsule 100 mg  100 mg Oral BID Ophelia Charter T, MD   100 mg at 11/10/18 3300  . enoxaparin (LOVENOX) injection 40 mg  40 mg Subcutaneous Q24H Ophelia Charter T, MD   40 mg at 11/10/18 0925  . feeding supplement (ENSURE ENLIVE) (ENSURE ENLIVE) liquid 237 mL  237 mL Oral BID BM Swayze, Ava, DO   237 mL at 11/10/18 0924  . HYDROmorphone (DILAUDID) injection 0.5 mg  0.5 mg Intravenous Q4H PRN Ophelia Charter T, MD   0.5 mg at 11/09/18 2330  . lactated ringers infusion   Intravenous Continuous Roderic Palau, MD 10 mL/hr at 11/09/18 0831    . menthol-cetylpyridinium (CEPACOL) lozenge 3 mg  1 lozenge Oral PRN Ophelia Charter T, MD       Or  . phenol (CHLORASEPTIC) mouth spray 1 spray  1 spray Mouth/Throat PRN Ophelia Charter T, MD      . metoCLOPramide (REGLAN) tablet 5-10 mg  5-10 mg Oral Q8H PRN Hiram Gash, MD  Or  . metoCLOPramide (REGLAN) injection 5-10 mg  5-10 mg Intravenous Q8H PRN Ophelia Charter T, MD      . multivitamin with minerals tablet 1 tablet  1 tablet Oral Daily Swayze, Ava, DO   1 tablet at 11/10/18 0922  . mupirocin ointment (BACTROBAN) 2 % 1 application  1 application Nasal BID Domenic Polite, MD      . ondansetron [facility]) tablet 4 mg  4 mg Oral Q6H PRN Hiram Gash, MD       Or  . ondansetron (ZOFRAN) injection 4 mg  4 mg Intravenous Q6H PRN Ophelia Charter T, MD      . oxyCODONE (Oxy IR/ROXICODONE) immediate release tablet 5-10 mg  5-10 mg Oral Q4H PRN Hiram Gash, MD          Discharge Medications: Please see discharge summary for a list of discharge medications.  Relevant Imaging Results:  Relevant Lab Results:   Additional Information SSN: [id number]  Alberteen Sam, LCSW

## 2018-11-11 LAB — RENAL FUNCTION PANEL
Albumin: 3 g/dL — ABNORMAL LOW (ref 3.5–5.0)
Anion gap: 8 (ref 5–15)
BUN: 35 mg/dL — ABNORMAL HIGH (ref 8–23)
CO2: 27 mmol/L (ref 22–32)
Calcium: 8.8 mg/dL — ABNORMAL LOW (ref 8.9–10.3)
Chloride: 108 mmol/L (ref 98–111)
Creatinine, Ser: 1.83 mg/dL — ABNORMAL HIGH (ref 0.61–1.24)
GFR calc Af Amer: 37 mL/min — ABNORMAL LOW (ref >60)
GFR calc non Af Amer: 32 mL/min — ABNORMAL LOW (ref >60)
Glucose, Bld: 132 mg/dL — ABNORMAL HIGH (ref 70–99)
Phosphorus: 2.8 mg/dL (ref 2.5–4.6)
Potassium: 3.7 mmol/L (ref 3.5–5.1)
Sodium: 143 mmol/L (ref 135–145)

## 2018-11-11 LAB — CBC
HCT: 33.9 % — ABNORMAL LOW (ref 39.0–52.0)
Hemoglobin: 11.3 g/dL — ABNORMAL LOW (ref 13.0–17.0)
MCH: 29.4 pg (ref 26.0–34.0)
MCHC: 33.3 g/dL (ref 30.0–36.0)
MCV: 88.1 fL (ref 80.0–100.0)
Platelets: 146 10*3/uL — ABNORMAL LOW (ref 150–400)
RBC: 3.85 MIL/uL — ABNORMAL LOW (ref 4.22–5.81)
RDW: 13.4 % (ref 11.5–15.5)
WBC: 10.9 10*3/uL — ABNORMAL HIGH (ref 4.0–10.5)
nRBC: 0 % (ref 0.0–0.2)

## 2018-11-11 LAB — MAGNESIUM: Magnesium: 2.1 mg/dL (ref 1.7–2.4)

## 2018-11-11 MED ORDER — QUETIAPINE FUMARATE 100 MG PO TABS
50.0000 mg | ORAL_TABLET | Freq: Every day | ORAL | Status: DC
  Filled 2018-11-11 (×2): qty 1

## 2018-11-11 NOTE — Progress Notes (Signed)
ORTHOPAEDIC PROGRESS NOTE  s/p Procedure(s): ARTHROPLASTY BIPOLAR HIP (HEMIARTHROPLASTY)  L  SUBJECTIVE: Confused  OBJECTIVE: Pe: left lower extremity: incision CDI, abduction pillow in place, leg lengths equal, warm well perfused foot, intact EHL/TA/GSC  Vitals:   11/11/18 1405 11/11/18 2019  BP: 135/88 91/66  Pulse: (!) 118 90  Resp: 20 20  Temp: 98.7 F (37.1 C) 98.7 F (37.1 C)  SpO2: 98% 97%     ASSESSMENT: Anthony Hamilton is a 85 y.o. male doing well postoperatively.  PLAN: Weightbearing: WBAT LLE with posterior hip precautions Insicional and dressing care: OK to remove dressings 7 days postop, leave steri strips and leave open to air with dry gauze PRN Orthopedic device(s): Abduction pillow  Showering: PRN VTE prophylaxis: Lovenox 40mg  qd x6 weeks, if ambulating well will transition to aspirin in clinic Pain control: PRN meds, minimize narcs Follow - up plan: 2 weeks with XR in clinic Contact information:  Weekdays 8-5 Ophelia Charter MD [phone], After hours and holidays please check [email or web address] for group call information for [facility]

## 2018-11-11 NOTE — Plan of Care (Signed)

## 2018-11-11 NOTE — Progress Notes (Signed)
CSW consulted with patient's wife, agreeable to choosing [facility] as bed choice based on bed offers reviewed with her today.   CSW has notified [facility].   [place], [place]

## 2018-11-11 NOTE — Progress Notes (Signed)
Anthony Hamilton  PROGRESS NOTE    Anthony Hamilton  WGY:[id number] DOB: August 22, 1927 DOA: 11/08/2018 PCP: Seward Carol, MD   Brief Narrative:   Anthony Hamilton a 85 y.o.malewith medical history significant ofhypertension,CKD 3,memory loss and cognitive dysfunction worsened over the past 6 months presented to the emergency room following a fall this morning. -Patient reports waking up to use the bathroom following which she was walking back and fell down,he does not remember the circumstances around his fall very clearly, does not think he lost consciousness.His wife woke up with the tight sound associated with his fall,he was awake and alert at that time according to her but unable to get off the floor. - Wife reports that he has been in his usual state of health this week has significant short-term memory deficits but otherwise was at baseline until the events of this morning. - Wife reports that he is physically very active, mows the lawn,does all the household activities around the house -He was seen in consultation by orthopedics was deemed a candidate for hip repair and was taken to the operating room on 6/14/2020where he underwent a left hemiarthroplasty without complications.   Assessment & Plan:   Active Problems:   Essential hypertension   Hip fracture, unspecified laterality, closed, initial encounter ([place])   Cognitive decline   Hip fracture ([place])   Left hip fracture      - s/p left hemiarthroplasty for neck fracture.     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 Currently requiring PRN agitation control/sitter. Increase seroquel to 50mg  qHS. Monitor.    DVT prophylaxis: lovenox Code Status:  DNR   Disposition Plan: TBD   Consultants:   Ortho  Procedures:   Left hip hemiarthroplasty    Subjective: Some agitation ON per nursing  Objective: Vitals:   11/10/18 1931 11/11/18 0419 11/11/18 0828 11/11/18 1405  BP: (!) 159/63 115/63 (!) 167/80 135/88  Pulse: 90 95 60 (!) 118  Resp: 18 16 18 20   Temp: 98.2 F (36.8 C) 98.4 F (36.9 C) 98.8 F (37.1 C) 98.7 F (37.1 C)  TempSrc: Oral Oral Oral Oral  SpO2: 100% 99% 98% 98%  Weight:      Height:        Intake/Output Summary (Last 24 hours) at 11/11/2018 1410 Last data filed at 11/11/2018 1200 Gross per 24 hour  Intake 420 ml  Output 800 ml  Net -380 ml   Filed Weights   11/09/18 2000  Weight: 72.2 kg    Examination:  General: 85 y.o. male resting in chair in NAD Cardiovascular: RRR, +S1, S2, no m/g/r, equal pulses throughout Respiratory: CTABL, no w/r/r, normal WOB GI: BS+, NDNT, no masses noted, no organomegaly noted MSK: No e/c/c Skin: No rashes, bruises, ulcerations noted Neuro: alert to name only, emotional this AM,  cooperative     Data Reviewed: I have personally reviewed following labs and imaging studies.  CBC: Recent Labs  Lab 11/08/18 1105 11/09/18 0338 11/09/18 1141 11/10/18 0508 11/11/18 0552  WBC 14.5* 13.1* 15.2* 12.3* 10.9*  NEUTROABS 13.2*  --   --   --   --  HGB 13.2 12.5* 11.8* 10.3* 11.3*  HCT 39.5 37.4* 35.0* 29.9* 33.9*  MCV 89.8 89.3 88.6 85.7 88.1  PLT 111* 135* 131* 127* 825*   Basic Metabolic Panel: Recent Labs  Lab 11/08/18 1105 11/09/18 0338 11/09/18 1141 11/10/18 0508 11/11/18 0552  NA 138 139  --  139 143  K 3.9 3.6  --  3.7 3.7  CL 106 106  --  108 108  CO2 21* 24  --  22 27  GLUCOSE 127* 186*  --  149* 132*  BUN 31* 35*  --  42* 35*  CREATININE 1.43* 1.76* 1.76* 2.20* 1.83*  CALCIUM 8.4* 8.4*  --  8.3* 8.8*  MG  --   --   --   --  2.1  PHOS  --   --   --   --  2.8   GFR: Estimated Creatinine Clearance: 26.1 mL/min (A) (by C-G formula based on  SCr of 1.83 mg/dL (H)). Liver Function Tests: Recent Labs  Lab 11/11/18 0552  ALBUMIN 3.0*   No results for input(s): LIPASE, AMYLASE in the last 168 hours. No results for input(s): AMMONIA in the last 168 hours. Coagulation Profile: Recent Labs  Lab 11/08/18 1105  INR 1.0   Cardiac Enzymes: No results for input(s): CKTOTAL, CKMB, CKMBINDEX, TROPONINI in the last 168 hours. BNP (last 3 results) No results for input(s): PROBNP in the last 8760 hours. HbA1C: No results for input(s): HGBA1C in the last 72 hours. CBG: No results for input(s): GLUCAP in the last 168 hours. Lipid Profile: No results for input(s): CHOL, HDL, LDLCALC, TRIG, CHOLHDL, LDLDIRECT in the last 72 hours. Thyroid Function Tests: Recent Labs    11/10/18 1530  FREET4 1.99*   Anemia Panel: No results for input(s): VITAMINB12, FOLATE, FERRITIN, TIBC, IRON, RETICCTPCT in the last 72 hours. Sepsis Labs: No results for input(s): PROCALCITON, LATICACIDVEN in the last 168 hours.  Recent Results (from the past 240 hour(s))  SARS Coronavirus 2 (CEPHEID - Performed in [facility] hospital lab), Hosp Order     Status: None   Collection Time: 11/08/18 12:08 PM   Specimen: Nasopharyngeal Swab  Result Value Ref Range Status   SARS Coronavirus 2 NEGATIVE NEGATIVE Final    Comment: (NOTE) If result is NEGATIVE SARS-CoV-2 target nucleic acids are NOT DETECTED. The SARS-CoV-2 RNA is generally detectable in upper and lower  respiratory specimens during the acute phase of infection. The lowest  concentration of SARS-CoV-2 viral copies this assay can detect is 250  copies / mL. A negative result does not preclude SARS-CoV-2 infection  and should not be used as the sole basis for treatment or other  patient management decisions.  A negative result may occur with  improper specimen collection / handling, submission of specimen other  than nasopharyngeal swab, presence of viral mutation(s) within the  areas targeted by  this assay, and inadequate number of viral copies  (<250 copies / mL). A negative result must be combined with clinical  observations, patient history, and epidemiological information. If result is POSITIVE SARS-CoV-2 target nucleic acids are DETECTED. The SARS-CoV-2 RNA is generally detectable in upper and lower  respiratory specimens dur ing the acute phase of infection.  Positive  results are indicative of active infection with SARS-CoV-2.  Clinical  correlation with patient history and other diagnostic information is  necessary to determine patient infection status.  Positive results do  not rule out bacterial infection or co-infection with other viruses. If result is PRESUMPTIVE POSTIVE  SARS-CoV-2 nucleic acids MAY BE PRESENT.   A presumptive positive result was obtained on the submitted specimen  and confirmed on repeat testing.  While 2019 novel coronavirus  (SARS-CoV-2) nucleic acids may be present in the submitted sample  additional confirmatory testing may be necessary for epidemiological  and / or clinical management purposes  to differentiate between  SARS-CoV-2 and other Sarbecovirus currently known to infect humans.  If clinically indicated additional testing with an alternate test  methodology [phone]) is advised. The SARS-CoV-2 RNA is generally  detectable in upper and lower respiratory sp ecimens during the acute  phase of infection. The expected result is Negative. Fact Sheet for Patients:  StrictlyIdeas.no Fact Sheet for Healthcare Providers: BankingDealers.co.za This test is not yet approved or cleared by the [place] FDA and has been authorized for detection and/or diagnosis of SARS-CoV-2 by FDA under an Emergency Use Authorization (EUA).  This EUA will remain in effect (meaning this test can be used) for the duration of the COVID-19 declaration under Section 564(b)(1) of the Act, 21 U.S.C. section  360bbb-3(b)(1), unless the authorization is terminated or revoked sooner. Performed at [facility], [place] [address], [place], [place] [address]   Surgical PCR screen     Status: None   Collection Time: 11/08/18 10:18 PM   Specimen: Nasal Mucosa; Nasal Swab  Result Value Ref Range Status   MRSA, PCR NEGATIVE NEGATIVE Final   Staphylococcus aureus NEGATIVE NEGATIVE Final    Comment: (NOTE) The Xpert SA Assay (FDA approved for NASAL specimens in patients 16 years of age and older), is one component of a comprehensive surveillance program. It is not intended to diagnose infection nor to guide or monitor treatment. Performed at [facility], [place] [address] [place], [place] [address]   Novel Coronavirus, NAA (hospital order; send-out to ref lab)     Status: None   Collection Time: 11/09/18  3:35 AM   Specimen: Nasopharyngeal Swab; Respiratory  Result Value Ref Range Status   SARS-CoV-2, NAA NOT DETECTED NOT DETECTED Final    Comment: (NOTE) This test was developed and its performance characteristics determined by [employer]. This test has not been FDA cleared or approved. This test has been authorized by FDA under an Emergency Use Authorization (EUA). This test is only authorized for the duration of time the declaration that circumstances exist justifying the authorization of the emergency use of in vitro diagnostic tests for detection of SARS-CoV-2 virus and/or diagnosis of COVID-19 infection under section 564(b)(1) of the Act, 21 U.S.C. 829HBZ-1(I)(9), unless the authorization is terminated or revoked sooner. When diagnostic testing is negative, the possibility of a false negative result should be considered in the context of a patient's recent exposures and the presence of clinical signs and symptoms consistent with COVID-19. An individual without symptoms of COVID-19 and who is not shedding SARS-CoV-2 virus would expect to have a  negative (not detected) result in this assay. Performed  At: [facility] [place], [place] [id number] Rush Farmer MD BP:[id number]    [place]  Final    Comment: Performed at [facility] [address] [place], [place] [address]         Radiology Studies: No results found.      Scheduled Meds: . acetaminophen  1,000 mg Oral Q8H  . docusate sodium  100 mg Oral BID  . enoxaparin (LOVENOX) injection  40 mg Subcutaneous Q24H  . feeding supplement (ENSURE ENLIVE)  237 mL Oral BID BM  . multivitamin with minerals  1 tablet Oral Daily  . mupirocin ointment  1 application Nasal BID  . QUEtiapine  50 mg Oral QHS   Continuous Infusions: . lactated ringers 10 mL/hr at 11/09/18 0831     LOS: 3 days    Time spent: 25 minutes spent in the coordination of care today.    Jonnie Finner, DO [facility] Pager [phone]  If 7PM-7AM, please contact night-coverage [email or web address] Password [email or web address] 11/11/2018, 2:10 PM

## 2018-11-12 ENCOUNTER — Inpatient Hospital Stay (HOSPITAL_COMMUNITY): Payer: Medicare Other

## 2018-11-12 DIAGNOSIS — N183 Chronic kidney disease, stage 3 (moderate): Secondary | ICD-10-CM

## 2018-11-12 LAB — RENAL FUNCTION PANEL
Albumin: 2.7 g/dL — ABNORMAL LOW (ref 3.5–5.0)
Anion gap: 10 (ref 5–15)
BUN: 49 mg/dL — ABNORMAL HIGH (ref 8–23)
CO2: 24 mmol/L (ref 22–32)
Calcium: 8.7 mg/dL — ABNORMAL LOW (ref 8.9–10.3)
Chloride: 113 mmol/L — ABNORMAL HIGH (ref 98–111)
Creatinine, Ser: 1.7 mg/dL — ABNORMAL HIGH (ref 0.61–1.24)
GFR calc Af Amer: 41 mL/min — ABNORMAL LOW (ref >60)
GFR calc non Af Amer: 35 mL/min — ABNORMAL LOW (ref >60)
Glucose, Bld: 137 mg/dL — ABNORMAL HIGH (ref 70–99)
Phosphorus: 4 mg/dL (ref 2.5–4.6)
Potassium: 3.8 mmol/L (ref 3.5–5.1)
Sodium: 147 mmol/L — ABNORMAL HIGH (ref 135–145)

## 2018-11-12 LAB — CBC
HCT: 30.4 % — ABNORMAL LOW (ref 39.0–52.0)
Hemoglobin: 10 g/dL — ABNORMAL LOW (ref 13.0–17.0)
MCH: 29.9 pg (ref 26.0–34.0)
MCHC: 32.9 g/dL (ref 30.0–36.0)
MCV: 90.7 fL (ref 80.0–100.0)
Platelets: 154 10*3/uL (ref 150–400)
RBC: 3.35 MIL/uL — ABNORMAL LOW (ref 4.22–5.81)
RDW: 13.8 % (ref 11.5–15.5)
WBC: 8.5 10*3/uL (ref 4.0–10.5)
nRBC: 0 % (ref 0.0–0.2)

## 2018-11-12 LAB — MAGNESIUM: Magnesium: 2.3 mg/dL (ref 1.7–2.4)

## 2018-11-12 MED ORDER — RESOURCE THICKENUP CLEAR PO POWD
ORAL | Status: DC | PRN
  Filled 2018-11-12: qty 125

## 2018-11-12 MED ORDER — QUETIAPINE 12.5 MG HALF TABLET
12.5000 mg | ORAL_TABLET | Freq: Every day | ORAL | Status: DC
  Administered 2018-11-12 – 2018-11-16 (×5): 12.5 mg via ORAL
  Filled 2018-11-12 (×5): qty 1

## 2018-11-12 NOTE — Progress Notes (Signed)
Physical Therapy Treatment Patient Details Name: Anthony Hamilton MRN: [id number] DOB: 1927-07-23 Today's Date: 11/12/2018    History of Present Illness 85 y.o. male with medical history significant of hypertension, CKD 3, memory loss and cognitive dysfunction worsened over the past 6 months. He presented to the emergency room following a fall. Xray revealed L hip fx. He underwent L hemiarthroplasty 11/09/18.    PT Comments    Pt performed gait training and functional mobility.  Pt is progressing slowly.  He required moderate assistance for gt and transfers.  Based on his deficits SNF placement remains most appropriate.  Plan next session for continue gt training and add strengthening exercises if patient is able to tolerate.    Follow Up Recommendations  SNF;Supervision/Assistance - 24 hour     Equipment Recommendations  Rolling walker with 5" wheels    Recommendations for Other Services       Precautions / Restrictions Precautions Precautions: Posterior Hip;Fall Precaution Comments: abd pillow in bed Restrictions Weight Bearing Restrictions: Yes LLE Weight Bearing: Weight bearing as tolerated    Mobility  Bed Mobility Overal bed mobility: Needs Assistance Bed Mobility: Supine to Sit     Supine to sit: Mod assist;+2 for physical assistance     General bed mobility comments: Pt required assistance to advance B hips/legs to edge of bed and to elevate trunk into sitting.  Pt held to PTA hands sitting edge of bed.  Transfers Overall transfer level: Needs assistance Equipment used: Rolling walker (2 wheeled) Transfers: Sit to/from Stand Sit to Stand: Mod assist         General transfer comment: Pt able to hold to PTAs hand and pull into standing, once in standing required moderate assistance to maintain balance.  Pt presents with posterior bias and flexed knees/hips/trunk.  Ambulation/Gait Ambulation/Gait assistance: Mod assist Gait Distance (Feet): 8 Feet(+ 20  ft) Assistive device: Rolling walker (2 wheeled) Gait Pattern/deviations: Step-to pattern;Shuffle;Trunk flexed;Narrow base of support     General Gait Details: Short shuffling gait.  Cues to take larger steps but continues to shuffle.  Pt required moderate assistance and assist to move and maintain RW. Pt required close chair follow,  Seated break between trials to adjust RW height.   Stairs             Wheelchair Mobility    Modified Rankin (Stroke Patients Only)       Balance Overall balance assessment: Needs assistance   Sitting balance-Leahy Scale: Poor Sitting balance - Comments: posterior bias     Standing balance-Leahy Scale: Poor Standing balance comment: B UE support and external assistance.                            Cognition Arousal/Alertness: Awake/alert Behavior During Therapy: WFL for tasks assessed/performed Overall Cognitive Status: History of cognitive impairments - at baseline                                        Exercises      General Comments        Pertinent Vitals/Pain Pain Assessment: Faces Faces Pain Scale: Hurts little more Pain Location: LLE during mobility Pain Descriptors / Indicators: Grimacing;Guarding Pain Intervention(s): Monitored during session;Repositioned    Home Living  Prior Function            PT Goals (current goals can now be found in the care plan section) Acute Rehab PT Goals Patient Stated Goal: not stated Potential to Achieve Goals: Fair Progress towards PT goals: Progressing toward goals    Frequency    Min 3X/week      PT Plan Current plan remains appropriate    Co-evaluation              AM-PAC PT "6 Clicks" Mobility   Outcome Measure  Help needed turning from your back to your side while in a flat bed without using bedrails?: A Lot Help needed moving from lying on your back to sitting on the side of a flat bed without  using bedrails?: A Lot Help needed moving to and from a bed to a chair (including a wheelchair)?: A Lot Help needed standing up from a chair using your arms (e.g., wheelchair or bedside chair)?: A Lot Help needed to walk in hospital room?: A Lot Help needed climbing 3-5 steps with a railing? : Total 6 Click Score: 11    End of Session Equipment Utilized During Treatment: Gait belt Activity Tolerance: Patient tolerated treatment well Patient left: in bed;with nursing/sitter in room;with bed alarm set;with restraints reapplied Nurse Communication: Mobility status PT Visit Diagnosis: Other abnormalities of gait and mobility (R26.89);Pain;History of falling (Z91.81) Pain - Right/Left: Left Pain - part of body: Hip     Time: 7616-0737 PT Time Calculation (min) (ACUTE ONLY): 23 min  Charges:  $Gait Training: 8-22 mins                     Governor Rooks, PTA Acute Rehabilitation Services Pager [phone] Office [phone]     [place] [place] Eli Hose 11/12/2018, 11:23 AM

## 2018-11-12 NOTE — Evaluation (Signed)
Clinical/Bedside Swallow Evaluation Patient Details  Name: Anthony Hamilton MRN: [id number] Date of Birth: 1928-02-05  Today's Date: 11/12/2018 Time: SLP Start Time (ACUTE ONLY): 1210 SLP Stop Time (ACUTE ONLY): 1337 SLP Time Calculation (min) (ACUTE ONLY): 87 min  Past Medical History: History reviewed. No pertinent past medical history. Past Surgical History:  Past Surgical History:  Procedure Laterality Date  . HIP ARTHROPLASTY Left 11/09/2018   Procedure: ARTHROPLASTY BIPOLAR HIP (HEMIARTHROPLASTY);  Surgeon: Hiram Gash, MD;  Location: [facility];  Service: Orthopedics;  Laterality: Left;   HPI:  Anthony Hamilton is a 85 y.o. male with medical history significant of hypertension, CKD 3, memory loss and cognitive dysfunction worsened over the past 6 months presented to the emergency room following a fall. Wife reports that he is physically very active, mows the lawn, does all the household activities around the house. Is now s/p left ARTHROPLASTY BIPOLAR HIP (HEMIARTHROPLASTY). Was observed to have difficulty swallowing with breakfast, orally holding/gurgling per note. No history of dysphagia in chart.      Assessment / Plan / Recommendation Clinical Impression  Pt demosntrates signs of dysphagia and potentially aspiration, likely related to lethargy and AMS following hip fx. Pt is drowsy but able to follow commands, positioned upright in chair, but head tilted posteriorally a bit, resistant to repositioning. Immediate signs of aspiration seen with thin liquids. though mulitple swallows still observed with puree and honey thick liquids, no immediate coughing or wet vocal quality observed. Pt recommended to consumed puree and honey thick liquids as mentation improves during hospitalization. If signs of aspriation persist when more alert, will consider MBS.  SLP Visit Diagnosis: Dysphagia, oropharyngeal phase (R13.12)    Aspiration Risk  Severe aspiration risk    Diet Recommendation  Dysphagia 1 (Puree);Honey-thick liquid   Liquid Administration via: Cup Medication Administration: Crushed with puree Supervision: Full supervision/cueing for compensatory strategies Compensations: Slow rate;Small sips/bites Postural Changes: Seated upright at 90 degrees    Other  Recommendations Oral Care Recommendations: Oral care BID   Follow up Recommendations Skilled Nursing facility      Frequency and Duration min 2x/week  2 weeks       Prognosis Prognosis for Safe Diet Advancement: Good Barriers to Reach Goals: Cognitive deficits      Swallow Study   General HPI: Anthony Hamilton is a 85 y.o. male with medical history significant of hypertension, CKD 3, memory loss and cognitive dysfunction worsened over the past 6 months presented to the emergency room following a fall. Wife reports that he is physically very active, mows the lawn, does all the household activities around the house. Is now s/p left ARTHROPLASTY BIPOLAR HIP (HEMIARTHROPLASTY). Was observed to have difficulty swallowing with breakfast, orally holding/gurgling per note. No history of dysphagia in chart.    Type of Study: Bedside Swallow Evaluation Previous Swallow Assessment: none Diet Prior to this Study: Regular;Thin liquids History of Recent Intubation: No Behavior/Cognition: Lethargic/Drowsy;Requires cueing Oral Cavity Assessment: Dry Oral Care Completed by SLP: No Oral Cavity - Dentition: Edentulous Vision: Impaired for self-feeding Self-Feeding Abilities: Total assist Patient Positioning: Upright in chair Baseline Vocal Quality: Low vocal intensity Volitional Cough: Strong Volitional Swallow: Able to elicit    Oral/Motor/Sensory Function Overall Oral Motor/Sensory Function: Within functional limits   Ice Chips     Thin Liquid Thin Liquid: Impaired Presentation: Cup Oral Phase Impairments: Poor awareness of bolus Oral Phase Functional Implications: Right anterior spillage;Left anterior  spillage Pharyngeal  Phase Impairments: Suspected delayed Swallow;Multiple swallows;Cough -  Immediate    Nectar Thick Nectar Thick Liquid: Not tested   Honey Thick Honey Thick Liquid: Impaired Presentation: Cup;Self fed Oral Phase Impairments: Reduced labial seal Oral Phase Functional Implications: Right anterior spillage;Left anterior spillage Pharyngeal Phase Impairments: Multiple swallows   Puree Puree: Impaired Oral Phase Impairments: Poor awareness of bolus(msticated puree, prolonged)   Solid     Solid: Not tested     Anthony Baltimore, MA CCC-SLP  Acute Rehabilitation Services Pager [phone] Office [phone]  Anthony Hamilton 11/12/2018,1:04 PM

## 2018-11-12 NOTE — Progress Notes (Signed)
[name]  PROGRESS NOTE    COURTNEY FENLON  IRC:[id number] DOB: July 30, 1927 DOA: 11/08/2018 PCP: Seward Carol, MD   Brief Narrative:   Anthony Arnold Campagnolois a 85 y.o.malewith medical history significant ofhypertension,CKD 3,memory loss and cognitive dysfunction worsened over the past 6 months presented to the emergency room following a fall this morning. -Patient reports waking up to use the bathroom following which she was walking back and fell down,he does not remember the circumstances around his fall very clearly, does not think he lost consciousness.His wife woke up with the tight sound associated with his fall,he was awake and alert at that time according to her but unable to get off the floor. - Wife reports that he has been in his usual state of health this week has significant short-term memory deficits but otherwise was at baseline until the events of this morning. - Wife reports that he is physically very active, mows the lawn,does all the household activities around the house -He was seen in consultation by orthopedics was deemed a candidate for hip repair and was taken to the operating room on 6/14/2020where he underwent a left hemiarthroplasty without complications.  11/12/2018: Patient seen.  Available records reviewed.  Minimal history from the patient.  Patient seems to be producing significant amount of phlegm.  Patient is still needing one-to-one observation.  Speech evaluation has been ordered.  We will also get a chest x-ray.  Assessment & Plan:   Active Problems:   Essential hypertension   Hip fracture, unspecified laterality, closed, initial encounter ([place])   Cognitive decline   Hip fracture ([place])   Left hip fracture      - s/p left hemiarthroplasty for neck fracture.     - PT/OT per ortho -Plan is for skilled nursing facility placement once patient is optimized.  Hypertension     - on zestoretic at home     - BPs ok; monitor  CKD 3     - SCr  improved on fluids, and possibly back to baseline.  Memory loss, cognitive decline     - B12 ok, TSH low; FT4 1.99; outpt follow up     - Decrease Seroquel to 12.5mg  po qhs (from 50 mg po qhs).  -DC Zofran -DC   DVT prophylaxis: lovenox Code Status: DNR   Disposition Plan: TBD   Consultants:   Ortho  Procedures:   Left hip hemiarthroplasty    Subjective: Minimal history from patient.  Objective: Vitals:   11/11/18 0828 11/11/18 1405 11/11/18 2019 11/12/18 0500  BP: (!) 167/80 135/88 91/66 (!) 116/53  Pulse: 60 (!) 118 90 92  Resp: 18 20 20 20   Temp: 98.8 F (37.1 C) 98.7 F (37.1 C) 98.7 F (37.1 C) 97.6 F (36.4 C)  TempSrc: Oral Oral Oral Oral  SpO2: 98% 98% 97% 98%  Weight:      Height:        Intake/Output Summary (Last 24 hours) at 11/12/2018 1035 Last data filed at 11/11/2018 1822 Gross per 24 hour  Intake 140 ml  Output 100 ml  Net 40 ml   Filed Weights   11/09/18 2000  Weight: 72.2 kg    Examination:  General: Not in any distress. Awake. Cardiovascular:S1, S2,  Respiratory: CTABL, GI: Soft and non tender. Organs are not palpable. MSK: No leg edema. Neuro: Awake. Seems to move all extremities.   Data Reviewed: I have personally reviewed following labs and imaging studies.  CBC: Recent Labs  Lab 11/08/18 1105 11/09/18 5102  11/09/18 1141 11/10/18 0508 11/11/18 0552 11/12/18 0715  WBC 14.5* 13.1* 15.2* 12.3* 10.9* 8.5  NEUTROABS 13.2*  --   --   --   --   --   HGB 13.2 12.5* 11.8* 10.3* 11.3* 10.0*  HCT 39.5 37.4* 35.0* 29.9* 33.9* 30.4*  MCV 89.8 89.3 88.6 85.7 88.1 90.7  PLT 111* 135* 131* 127* 146* 793   Basic Metabolic Panel: Recent Labs  Lab 11/08/18 1105 11/09/18 0338 11/09/18 1141 11/10/18 0508 11/11/18 0552 11/12/18 0715  NA 138 139  --  139 143 147*  K 3.9 3.6  --  3.7 3.7 3.8  CL 106 106  --  108 108 113*  CO2 21* 24  --  22 27 24   GLUCOSE 127* 186*  --  149* 132* 137*  BUN 31* 35*  --  42* 35* 49*   CREATININE 1.43* 1.76* 1.76* 2.20* 1.83* 1.70*  CALCIUM 8.4* 8.4*  --  8.3* 8.8* 8.7*  MG  --   --   --   --  2.1 2.3  PHOS  --   --   --   --  2.8 4.0   GFR: Estimated Creatinine Clearance: 28.1 mL/min (A) (by C-G formula based on SCr of 1.7 mg/dL (H)). Liver Function Tests: Recent Labs  Lab 11/11/18 0552 11/12/18 0715  ALBUMIN 3.0* 2.7*   No results for input(s): LIPASE, AMYLASE in the last 168 hours. No results for input(s): AMMONIA in the last 168 hours. Coagulation Profile: Recent Labs  Lab 11/08/18 1105  INR 1.0   Cardiac Enzymes: No results for input(s): CKTOTAL, CKMB, CKMBINDEX, TROPONINI in the last 168 hours. BNP (last 3 results) No results for input(s): PROBNP in the last 8760 hours. HbA1C: No results for input(s): HGBA1C in the last 72 hours. CBG: No results for input(s): GLUCAP in the last 168 hours. Lipid Profile: No results for input(s): CHOL, HDL, LDLCALC, TRIG, CHOLHDL, LDLDIRECT in the last 72 hours. Thyroid Function Tests: Recent Labs    11/10/18 1530  FREET4 1.99*   Anemia Panel: No results for input(s): VITAMINB12, FOLATE, FERRITIN, TIBC, IRON, RETICCTPCT in the last 72 hours. Sepsis Labs: No results for input(s): PROCALCITON, LATICACIDVEN in the last 168 hours.  Recent Results (from the past 240 hour(s))  SARS Coronavirus 2 (CEPHEID - Performed in [place] hospital lab), Hosp Order     Status: None   Collection Time: 11/08/18 12:08 PM   Specimen: Nasopharyngeal Swab  Result Value Ref Range Status   SARS Coronavirus 2 NEGATIVE NEGATIVE Final    Comment: (NOTE) If result is NEGATIVE SARS-CoV-2 target nucleic acids are NOT DETECTED. The SARS-CoV-2 RNA is generally detectable in upper and lower  respiratory specimens during the acute phase of infection. The lowest  concentration of SARS-CoV-2 viral copies this assay can detect is 250  copies / mL. A negative result does not preclude SARS-CoV-2 infection  and should not be used as the sole  basis for treatment or other  patient management decisions.  A negative result may occur with  improper specimen collection / handling, submission of specimen other  than nasopharyngeal swab, presence of viral mutation(s) within the  areas targeted by this assay, and inadequate number of viral copies  (<250 copies / mL). A negative result must be combined with clinical  observations, patient history, and epidemiological information. If result is POSITIVE SARS-CoV-2 target nucleic acids are DETECTED. The SARS-CoV-2 RNA is generally detectable in upper and lower  respiratory specimens dur ing the  acute phase of infection.  Positive  results are indicative of active infection with SARS-CoV-2.  Clinical  correlation with patient history and other diagnostic information is  necessary to determine patient infection status.  Positive results do  not rule out bacterial infection or co-infection with other viruses. If result is PRESUMPTIVE POSTIVE SARS-CoV-2 nucleic acids MAY BE PRESENT.   A presumptive positive result was obtained on the submitted specimen  and confirmed on repeat testing.  While 2019 novel coronavirus  (SARS-CoV-2) nucleic acids may be present in the submitted sample  additional confirmatory testing may be necessary for epidemiological  and / or clinical management purposes  to differentiate between  SARS-CoV-2 and other Sarbecovirus currently known to infect humans.  If clinically indicated additional testing with an alternate test  methodology [id number]) is advised. The SARS-CoV-2 RNA is generally  detectable in upper and lower respiratory sp ecimens during the acute  phase of infection. The expected result is Negative. Fact Sheet for Patients:  StrictlyIdeas.no Fact Sheet for Healthcare Providers: BankingDealers.co.za This test is not yet approved or cleared by the [place] FDA and has been authorized for detection  and/or diagnosis of SARS-CoV-2 by FDA under an Emergency Use Authorization (EUA).  This EUA will remain in effect (meaning this test can be used) for the duration of the COVID-19 declaration under Section 564(b)(1) of the Act, 21 U.S.C. section 360bbb-3(b)(1), unless the authorization is terminated or revoked sooner. Performed at [facility], [place] [address] [place], [place] [address]   Surgical PCR screen     Status: None   Collection Time: 11/08/18 10:18 PM   Specimen: Nasal Mucosa; Nasal Swab  Result Value Ref Range Status   MRSA, PCR NEGATIVE NEGATIVE Final   Staphylococcus aureus NEGATIVE NEGATIVE Final    Comment: (NOTE) The Xpert SA Assay (FDA approved for NASAL specimens in patients 74 years of age and older), is one component of a comprehensive surveillance program. It is not intended to diagnose infection nor to guide or monitor treatment. Performed at [facility], [place] [address], [place], [place] [address]   Novel Coronavirus, NAA (hospital order; send-out to ref lab)     Status: None   Collection Time: 11/09/18  3:35 AM   Specimen: Nasopharyngeal Swab; Respiratory  Result Value Ref Range Status   SARS-CoV-2, NAA NOT DETECTED NOT DETECTED Final    Comment: (NOTE) This test was developed and its performance characteristics determined by [employer]. This test has not been FDA cleared or approved. This test has been authorized by FDA under an Emergency Use Authorization (EUA). This test is only authorized for the duration of time the declaration that circumstances exist justifying the authorization of the emergency use of in vitro diagnostic tests for detection of SARS-CoV-2 virus and/or diagnosis of COVID-19 infection under section 564(b)(1) of the Act, 21 U.S.C. 154MGQ-6(P)(6), unless the authorization is terminated or revoked sooner. When diagnostic testing is negative, the possibility of a false negative result  should be considered in the context of a patient's recent exposures and the presence of clinical signs and symptoms consistent with COVID-19. An individual without symptoms of COVID-19 and who is not shedding SARS-CoV-2 virus would expect to have a negative (not detected) result in this assay. Performed  At: [facility] [place], [place] [id number] Rush Farmer MD TI:[id number]    [place]  Final    Comment: Performed at [facility] [address] [place], [place]  [place]         Radiology Studies: No results found.      Scheduled Meds: . acetaminophen  1,000 mg Oral Q8H  . docusate sodium  100 mg Oral BID  . enoxaparin (LOVENOX) injection  40 mg Subcutaneous Q24H  . feeding supplement (ENSURE ENLIVE)  237 mL Oral BID BM  . multivitamin with minerals  1 tablet Oral Daily  . mupirocin ointment  1 application Nasal BID  . QUEtiapine  50 mg Oral QHS   Continuous Infusions: . lactated ringers 10 mL/hr at 11/09/18 0831     LOS: 4 days    Time spent: 25 minutes spent in the coordination of care today.    Bonnell Public, MD [facility]  If 7PM-7AM, please contact night-coverage [email or web address] Password [facility] 11/12/2018, 10:35 AM

## 2018-11-12 NOTE — Care Management Important Message (Signed)
Important Message  Patient Details  Name: Anthony Hamilton MRN: [id number] Date of Birth: Jul 05, 1927   Medicare Important Message Given:  Yes    Memory Argue 11/12/2018, 1:57 PM

## 2018-11-12 NOTE — Progress Notes (Signed)
Pt was having a difficult time swallowing his breakfast and was holding food in his mouth. Stopped feeding pt and notified MD received verbal order for SLP consult.

## 2018-11-12 NOTE — Care Management Important Message (Signed)
Important Message  Patient Details  Name: Anthony Hamilton MRN: [id number] Date of Birth: 07/29/1927   Medicare Important Message Given:  Yes    Memory Argue 11/12/2018, 1:56 PM

## 2018-11-13 NOTE — Progress Notes (Signed)
[name] Kitchen  PROGRESS NOTE    Anthony Hamilton  PPJ:[id number] DOB: [birth year] DOA: 11/08/2018 PCP: Seward Carol, MD   Brief Narrative:   Anthony Hamilton a 85 y.o.malewith medical history significant ofhypertension,CKD 3,memory loss and cognitive dysfunction worsened over the past 6 months presented to the emergency room following a fall this morning. -Patient reports waking up to use the bathroom following which she was walking back and fell down,he does not remember the circumstances around his fall very clearly, does not think he lost consciousness.His wife woke up with the tight sound associated with his fall,he was awake and alert at that time according to her but unable to get off the floor. - Wife reports that he has been in his usual state of health this week has significant short-term memory deficits but otherwise was at baseline until the events of this morning. - Wife reports that he is physically very active, mows the lawn,does all the household activities around the house -He was seen in consultation by orthopedics was deemed a candidate for hip repair and was taken to the operating room on 6/14/2020where he underwent a left hemiarthroplasty without complications.  11/12/2018: Patient seen.  Available records reviewed.  Minimal history from the patient.  Patient seems to be producing significant amount of phlegm.  Patient is still needing one-to-one observation.  Speech evaluation has been ordered.  We will also get a chest x-ray.  11/13/2018: Patient seen.  Patient looks better today.  Patient is more awake and interactive.  Drooling has resolved significantly.  Checks x-ray reading is noted.  Also reviewed chest x-ray personally.  Will encourage incentive spirometry.  Hopefully, patient will be discharged to skilled nursing facility once he is able to come off one-to-one.  Assessment & Plan:   Active Problems:   Essential hypertension   Hip fracture, unspecified  laterality, closed, initial encounter ([place])   Cognitive decline   Hip fracture ([place])   Left hip fracture      - s/p left hemiarthroplasty for neck fracture.     - PT/OT per ortho -Plan is for skilled nursing facility placement once patient is optimized.  Hypertension     - on zestoretic at home     - BP today is 135/67 mmHg.  CKD 3     - SCr improved on fluids, and possibly back to baseline.  Memory loss, cognitive decline     - B12 ok, TSH low; FT4 1.99; outpt follow up     - Decrease Seroquel to 12.5mg  po qhs (from 50 mg po qhs).  -DC Zofran -DC   DVT prophylaxis: lovenox Code Status: DNR   Disposition Plan: TBD   Consultants:   Ortho  Procedures:   Left hip hemiarthroplasty    Subjective: Changes my awake and interactive today. No fever or chills. No shortness of breath.  Objective: Vitals:   11/12/18 0500 11/12/18 1457 11/12/18 1945 11/13/18 0351  BP: (!) 116/53 140/75 136/77 135/67  Pulse: 92 100 77 61  Resp: 20 16 18 16   Temp: 97.6 F (36.4 C) 98.5 F (36.9 C) 98.1 F (36.7 C) (!) 97.3 F (36.3 C)  TempSrc: Oral Oral Oral Oral  SpO2: 98% 99% 95% 97%  Weight:      Height:        Intake/Output Summary (Last 24 hours) at 11/13/2018 1736 Last data filed at 11/13/2018 1735 Gross per 24 hour  Intake 350 ml  Output -  Net 350 ml   [employer]  11/09/18 2000  Weight: 72.2 kg    Examination:  General: Not in any distress. Awake. Cardiovascular:S1, S2,  Respiratory: CTABL, GI: Soft and non tender. Organs are not palpable. MSK: No leg edema. Neuro: Awake and alert. Seems to move all extremities.   Data Reviewed: I have personally reviewed following labs and imaging studies.  CBC: Recent Labs  Lab 11/08/18 1105 11/09/18 0338 11/09/18 1141 11/10/18 0508 11/11/18 0552 11/12/18 0715  WBC 14.5* 13.1* 15.2* 12.3* 10.9* 8.5  NEUTROABS 13.2*  --   --   --   --   --   HGB 13.2 12.5* 11.8* 10.3* 11.3* 10.0*  HCT 39.5 37.4* 35.0*  29.9* 33.9* 30.4*  MCV 89.8 89.3 88.6 85.7 88.1 90.7  PLT 111* 135* 131* 127* 146* 786   Basic Metabolic Panel: Recent Labs  Lab 11/08/18 1105 11/09/18 0338 11/09/18 1141 11/10/18 0508 11/11/18 0552 11/12/18 0715  NA 138 139  --  139 143 147*  K 3.9 3.6  --  3.7 3.7 3.8  CL 106 106  --  108 108 113*  CO2 21* 24  --  22 27 24   GLUCOSE 127* 186*  --  149* 132* 137*  BUN 31* 35*  --  42* 35* 49*  CREATININE 1.43* 1.76* 1.76* 2.20* 1.83* 1.70*  CALCIUM 8.4* 8.4*  --  8.3* 8.8* 8.7*  MG  --   --   --   --  2.1 2.3  PHOS  --   --   --   --  2.8 4.0   GFR: Estimated Creatinine Clearance: 28.1 mL/min (A) (by C-G formula based on SCr of 1.7 mg/dL (H)). Liver Function Tests: Recent Labs  Lab 11/11/18 0552 11/12/18 0715  ALBUMIN 3.0* 2.7*   No results for input(s): LIPASE, AMYLASE in the last 168 hours. No results for input(s): AMMONIA in the last 168 hours. Coagulation Profile: Recent Labs  Lab 11/08/18 1105  INR 1.0   Cardiac Enzymes: No results for input(s): CKTOTAL, CKMB, CKMBINDEX, TROPONINI in the last 168 hours. BNP (last 3 results) No results for input(s): PROBNP in the last 8760 hours. HbA1C: No results for input(s): HGBA1C in the last 72 hours. CBG: No results for input(s): GLUCAP in the last 168 hours. Lipid Profile: No results for input(s): CHOL, HDL, LDLCALC, TRIG, CHOLHDL, LDLDIRECT in the last 72 hours. Thyroid Function Tests: No results for input(s): TSH, T4TOTAL, FREET4, T3FREE, THYROIDAB in the last 72 hours. Anemia Panel: No results for input(s): VITAMINB12, FOLATE, FERRITIN, TIBC, IRON, RETICCTPCT in the last 72 hours. Sepsis Labs: No results for input(s): PROCALCITON, LATICACIDVEN in the last 168 hours.  Recent Results (from the past 240 hour(s))  SARS Coronavirus 2 (CEPHEID - Performed in [facility] hospital lab), Hosp Order     Status: None   Collection Time: 11/08/18 12:08 PM   Specimen: Nasopharyngeal Swab  Result Value Ref Range Status    SARS Coronavirus 2 NEGATIVE NEGATIVE Final    Comment: (NOTE) If result is NEGATIVE SARS-CoV-2 target nucleic acids are NOT DETECTED. The SARS-CoV-2 RNA is generally detectable in upper and lower  respiratory specimens during the acute phase of infection. The lowest  concentration of SARS-CoV-2 viral copies this assay can detect is 250  copies / mL. A negative result does not preclude SARS-CoV-2 infection  and should not be used as the sole basis for treatment or other  patient management decisions.  A negative result may occur with  improper specimen collection / handling, submission of specimen  other  than nasopharyngeal swab, presence of viral mutation(s) within the  areas targeted by this assay, and inadequate number of viral copies  (<250 copies / mL). A negative result must be combined with clinical  observations, patient history, and epidemiological information. If result is POSITIVE SARS-CoV-2 target nucleic acids are DETECTED. The SARS-CoV-2 RNA is generally detectable in upper and lower  respiratory specimens dur ing the acute phase of infection.  Positive  results are indicative of active infection with SARS-CoV-2.  Clinical  correlation with patient history and other diagnostic information is  necessary to determine patient infection status.  Positive results do  not rule out bacterial infection or co-infection with other viruses. If result is PRESUMPTIVE POSTIVE SARS-CoV-2 nucleic acids MAY BE PRESENT.   A presumptive positive result was obtained on the submitted specimen  and confirmed on repeat testing.  While 2019 novel coronavirus  (SARS-CoV-2) nucleic acids may be present in the submitted sample  additional confirmatory testing may be necessary for epidemiological  and / or clinical management purposes  to differentiate between  SARS-CoV-2 and other Sarbecovirus currently known to infect humans.  If clinically indicated additional testing with an alternate test   methodology [phone]) is advised. The SARS-CoV-2 RNA is generally  detectable in upper and lower respiratory sp ecimens during the acute  phase of infection. The expected result is Negative. Fact Sheet for Patients:  StrictlyIdeas.no Fact Sheet for Healthcare Providers: BankingDealers.co.za This test is not yet approved or cleared by the [place] FDA and has been authorized for detection and/or diagnosis of SARS-CoV-2 by FDA under an Emergency Use Authorization (EUA).  This EUA will remain in effect (meaning this test can be used) for the duration of the COVID-19 declaration under Section 564(b)(1) of the Act, 21 U.S.C. section 360bbb-3(b)(1), unless the authorization is terminated or revoked sooner. Performed at [facility], [place] [address] [place], [place] [address]   Surgical PCR screen     Status: None   Collection Time: 11/08/18 10:18 PM   Specimen: Nasal Mucosa; Nasal Swab  Result Value Ref Range Status   MRSA, PCR NEGATIVE NEGATIVE Final   Staphylococcus aureus NEGATIVE NEGATIVE Final    Comment: (NOTE) The Xpert SA Assay (FDA approved for NASAL specimens in patients 87 years of age and older), is one component of a comprehensive surveillance program. It is not intended to diagnose infection nor to guide or monitor treatment. Performed at [facility], [place] [address], [place], [place] [address]   Novel Coronavirus, NAA (hospital order; send-out to ref lab)     Status: None   Collection Time: 11/09/18  3:35 AM   Specimen: Nasopharyngeal Swab; Respiratory  Result Value Ref Range Status   SARS-CoV-2, NAA NOT DETECTED NOT DETECTED Final    Comment: (NOTE) This test was developed and its performance characteristics determined by [employer]. This test has not been FDA cleared or approved. This test has been authorized by FDA under an Emergency Use Authorization (EUA). This  test is only authorized for the duration of time the declaration that circumstances exist justifying the authorization of the emergency use of in vitro diagnostic tests for detection of SARS-CoV-2 virus and/or diagnosis of COVID-19 infection under section 564(b)(1) of the Act, 21 U.S.C. 854OEV-0(J)(5), unless the authorization is terminated or revoked sooner. When diagnostic testing is negative, the possibility of a false negative result should be considered in the context of a patient's recent exposures and the presence of clinical signs and  symptoms consistent with COVID-19. An individual without symptoms of COVID-19 and who is not shedding SARS-CoV-2 virus would expect to have a negative (not detected) result in this assay. Performed  At: [facility] [place], [place] [id number] Rush Farmer MD MC:[id number]    [place]  Final    Comment: Performed at [facility] [address] [place], [place] [address]         Radiology Studies: Dg Chest Port 1 View  Result Date: 11/12/2018 CLINICAL DATA:  Pneumonia EXAM: PORTABLE CHEST 1 VIEW COMPARISON:  11/08/2018, 12/02/2006 FINDINGS: Probable small left effusion. Mild airspace disease at the left base. Mild cardiomegaly with aortic atherosclerosis. No pneumothorax. Small sclerotic foci in the right humerus and right scapula. IMPRESSION: Probable trace left effusion. Airspace disease at the left lung base may reflect atelectasis or a pneumonia Electronically Signed   By: Donavan Foil M.D.   On: 11/12/2018 22:15        Scheduled Meds: . docusate sodium  100 mg Oral BID  . enoxaparin (LOVENOX) injection  40 mg Subcutaneous Q24H  . feeding supplement (ENSURE ENLIVE)  237 mL Oral BID BM  . multivitamin with minerals  1 tablet Oral Daily  . mupirocin ointment  1 application Nasal BID  . QUEtiapine  12.5 mg Oral QHS   Continuous Infusions: . lactated ringers 10  mL/hr at 11/09/18 0831     LOS: 5 days    Time spent: 25 minutes spent in the coordination of care today.    Bonnell Public, MD [facility]  If 7PM-7AM, please contact night-coverage [email or web address] Password [facility] 11/13/2018, 5:36 PM

## 2018-11-13 NOTE — Progress Notes (Signed)
  Speech Language Pathology Treatment: Dysphagia  Patient Details Name: Anthony Hamilton MRN: [id number] DOB: 01/25/1928 Today's Date: 11/13/2018 Time: 2244-9753 SLP Time Calculation (min) (ACUTE ONLY): 20 min  Assessment / Plan / Recommendation Clinical Impression  Patient seen to address dysphagia goals with trials of upgraded liquids (thin liquids, nectar thick liquids). Patient cooperative but easily distracted and confused. He consumed small sips of thin liquids with audible swallow, belching and multiple swallows. With nectar thick liquids, he did not exhibit any overt s/s of aspiration or penetration. Plan to upgrade liquids from honey thick to nectar thick liquids and will determiner readiness to upgrade liquids and solids next date at bedside vs need for objective swallow study.    HPI HPI: Anthony Hamilton is a 85 y.o. male with medical history significant of hypertension, CKD 3, memory loss and cognitive dysfunction worsened over the past 6 months presented to the emergency room following a fall. Wife reports that he is physically very active, mows the lawn, does all the household activities around the house. Is now s/p left ARTHROPLASTY BIPOLAR HIP (HEMIARTHROPLASTY). Was observed to have difficulty swallowing with breakfast, orally holding/gurgling per note. No history of dysphagia in chart.         SLP Plan  Continue with current plan of care       Recommendations  Diet recommendations: Dysphagia 1 (puree);Nectar-thick liquid Liquids provided via: Cup Medication Administration: Crushed with puree Supervision: Staff to assist with self feeding;Full supervision/cueing for compensatory strategies;Trained caregiver to feed patient Compensations: Small sips/bites;Slow rate;Minimize environmental distractions Postural Changes and/or Swallow Maneuvers: Seated upright 90 degrees                Oral Care Recommendations: Oral care BID Follow up Recommendations:  Skilled Nursing facility SLP Visit Diagnosis: Dysphagia, oropharyngeal phase (R13.12) Plan: Continue with current plan of care       GO                Dannial Monarch 11/13/2018, 4:45 PM    Sonia Baller, MA, CCC-SLP Speech Therapy [facility] Acute Rehab Pager: [phone]

## 2018-11-13 NOTE — Plan of Care (Signed)
  Problem: Safety: Goal: Ability to remain free from injury will improve Outcome: Progressing   

## 2018-11-13 NOTE — Progress Notes (Signed)
Nutrition Follow-up   RD working remotely.  DOCUMENTATION CODES:   Not applicable  INTERVENTION:  Continue Ensure Enlive po BID (thickened to honey thick consistency), each supplement provides 350 kcal and 20 grams of protein.  Provide Magic cup TID with meals, each supplement provides 290 kcal and 9 grams of protein  Encourage adequate PO intake.   NUTRITION DIAGNOSIS:   Inadequate oral intake related to inability to eat as evidenced by NPO status; diet advanced  GOAL:   Patient will meet greater than or equal to 90% of their needs; progressing  MONITOR:   Diet advancement, PO intake, Labs, Weight trends, Skin  REASON FOR ASSESSMENT:   Malnutrition Screening Tool    ASSESSMENT:   85 y.o. male with medical history significant of HTN, CKD stage 3, memory loss, and cognitive dysfunction worsened over the past 6 months. He presented to the ED following a mechanical the morning of 6/13. He does not recall the circumstances surrounding the fall but does not think he hit his head or lost consciousness. Wife reported patient has significant short-term memory loss. She reported he is very physically active, mows the lawn, and does all of the household activities around the house.  Procedure (6/14): Left hip hemiarthroplasty    Diet has been downgraded to a dysphagia 1 diet with honey thick liquids per SLP evaluation. Dysphagia likely related to lethargy and AMS following hip fracture. Meal completion has been 25-35%. Pt currently has Ensure ordered and has been consuming them. RD to additionally order Magic cup at meals to aid in caloric and protein needs.   Labs and medications reviewed.   Diet Order:   Diet Order            DIET - DYS 1 Room service appropriate? Yes; Fluid consistency: Honey Thick  Diet effective now              EDUCATION NEEDS:   No education needs have been identified at this time  Skin:  Skin Assessment: Skin Integrity Issues: Skin Integrity  Issues:: Incisions Incisions: L hip  Last BM:  6/13  Height:   Ht Readings from Last 1 Encounters:  11/09/18 5\' 7"  (1.702 m)    Weight:   Wt Readings from Last 1 Encounters:  11/09/18 72.2 kg    Ideal Body Weight:  67.27 kg  BMI:  Body mass index is 24.93 kg/m.  Estimated Nutritional Needs:   Kcal:  1700-1850  Protein:  75-85 grams  Fluid:  >/= 1.7 L/day    [name], MS, RD, LDN Pager # [phone] After hours/ weekend pager # [phone]

## 2018-11-13 NOTE — Progress Notes (Signed)
Physical Therapy Treatment Patient Details Name: Anthony Hamilton MRN: [id number] DOB: [birth year] Today's Date: 11/13/2018    History of Present Illness 85 y.o. male with medical history significant of hypertension, CKD 3, memory loss and cognitive dysfunction worsened over the past 6 months. He presented to the emergency room following a fall. Xray revealed L hip fx. He underwent L hemiarthroplasty 11/09/18.    PT Comments    Pt performed progression of gt distance.  He continues to require moderate assistance for transfers and gait and utilizes a close chair follow.  Pt fatigues quickly.  He is unable to recall/state or maintain posterior precautions due to cognition.  Pt is tearful intermittently during session and appears to be recalling events from his past ( where he worked and family) .  Continue to recommend SNF placement to improve strength and function before returning home.    Follow Up Recommendations  SNF;Supervision/Assistance - 24 hour     Equipment Recommendations  Rolling walker with 5" wheels    Recommendations for Other Services       Precautions / Restrictions Precautions Precautions: Posterior Hip;Fall Precaution Comments: abd pillow in bed Restrictions Weight Bearing Restrictions: Yes LLE Weight Bearing: Weight bearing as tolerated    Mobility  Bed Mobility Overal bed mobility: Needs Assistance Bed Mobility: Sit to Supine       Sit to supine: Max assist   General bed mobility comments: Pt required assistance to lower trunk to bed and lift B LEs back to bed.  He required +2 to scoot to [facility].  Transfers Overall transfer level: Needs assistance Equipment used: Rolling walker (2 wheeled) Transfers: Sit to/from Stand Sit to Stand: Mod assist;+2 safety/equipment         General transfer comment: Pt required cues for hand placement and moderate assistance to boost into standing.  Pt holding to RW with flexed knees, hips and trunk.  Cues for  extension but appears to be his baseline posturing.  He did remove one hand from RW to drink from a cup when speech therapist brought him a drink back.  Moderate assistance to maintain balance with unilateral UE support.  Ambulation/Gait Ambulation/Gait assistance: Mod assist;+2 safety/equipment(for chair follow) Gait Distance (Feet): 60 Feet Assistive device: Rolling walker (2 wheeled) Gait Pattern/deviations: Step-to pattern;Shuffle;Drifts right/left;Trunk flexed     General Gait Details: Pt continues with short shuffling gt pattern.  Pt is able to progress speed but unable to improve step length.  His knees appear to be contracted which makes him ambulate with a flexed pattern.  Close chair follow needed.   Stairs             Wheelchair Mobility    Modified Rankin (Stroke Patients Only)       Balance Overall balance assessment: Needs assistance   Sitting balance-Leahy Scale: Poor Sitting balance - Comments: posterior bias     Standing balance-Leahy Scale: Poor Standing balance comment: B UE support and external assistance.                            Cognition Arousal/Alertness: Awake/alert Behavior During Therapy: WFL for tasks assessed/performed Overall Cognitive Status: History of cognitive impairments - at baseline                                 General Comments: Pt remains polite and cooperative he starts to cry randomly throughout session.  At times it is hard to make out what he is trying to say.      Exercises      General Comments        Pertinent Vitals/Pain Pain Assessment: Faces Faces Pain Scale: Hurts little more Pain Location: LLE during mobility Pain Descriptors / Indicators: Grimacing;Guarding Pain Intervention(s): Monitored during session;Repositioned    Home Living                      Prior Function            PT Goals (current goals can now be found in the care plan section) Acute Rehab PT  Goals Patient Stated Goal: not stated Potential to Achieve Goals: Fair Progress towards PT goals: Progressing toward goals    Frequency    Min 3X/week      PT Plan Current plan remains appropriate    Co-evaluation              AM-PAC PT "6 Clicks" Mobility   Outcome Measure  Help needed turning from your back to your side while in a flat bed without using bedrails?: A Lot Help needed moving from lying on your back to sitting on the side of a flat bed without using bedrails?: A Lot Help needed moving to and from a bed to a chair (including a wheelchair)?: A Lot Help needed standing up from a chair using your arms (e.g., wheelchair or bedside chair)?: A Lot Help needed to walk in hospital room?: A Lot Help needed climbing 3-5 steps with a railing? : Total 6 Click Score: 11    End of Session Equipment Utilized During Treatment: Gait belt Activity Tolerance: Patient tolerated treatment well Patient left: in bed;with nursing/sitter in room;with bed alarm set;with restraints reapplied Nurse Communication: Mobility status PT Visit Diagnosis: Other abnormalities of gait and mobility (R26.89);Pain;History of falling (Z91.81) Pain - Right/Left: Left Pain - part of body: Hip     Time: 7253-6644 PT Time Calculation (min) (ACUTE ONLY): 21 min  Charges:  $Gait Training: 8-22 mins                     Governor Rooks, PTA Acute Rehabilitation Services Pager [phone] Office [phone]     Lequisha Cammack Eli Hose 11/13/2018, 3:30 PM

## 2018-11-14 ENCOUNTER — Inpatient Hospital Stay (HOSPITAL_COMMUNITY): Payer: Medicare Other

## 2018-11-14 LAB — CBC WITH DIFFERENTIAL/PLATELET
Abs Immature Granulocytes: 0.19 10*3/uL — ABNORMAL HIGH (ref 0.00–0.07)
Basophils Absolute: 0.1 10*3/uL (ref 0.0–0.1)
Basophils Relative: 0 %
Eosinophils Absolute: 0 10*3/uL (ref 0.0–0.5)
Eosinophils Relative: 0 %
HCT: 37.8 % — ABNORMAL LOW (ref 39.0–52.0)
Hemoglobin: 12.2 g/dL — ABNORMAL LOW (ref 13.0–17.0)
Immature Granulocytes: 1 %
Lymphocytes Relative: 6 %
Lymphs Abs: 0.9 10*3/uL (ref 0.7–4.0)
MCH: 29.9 pg (ref 26.0–34.0)
MCHC: 32.3 g/dL (ref 30.0–36.0)
MCV: 92.6 fL (ref 80.0–100.0)
Monocytes Absolute: 1 10*3/uL (ref 0.1–1.0)
Monocytes Relative: 7 %
Neutro Abs: 13.4 10*3/uL — ABNORMAL HIGH (ref 1.7–7.7)
Neutrophils Relative %: 86 %
Platelets: 244 10*3/uL (ref 150–400)
RBC: 4.08 MIL/uL — ABNORMAL LOW (ref 4.22–5.81)
RDW: 13.6 % (ref 11.5–15.5)
WBC: 15.5 10*3/uL — ABNORMAL HIGH (ref 4.0–10.5)
nRBC: 0 % (ref 0.0–0.2)

## 2018-11-14 LAB — RENAL FUNCTION PANEL
Albumin: 3.3 g/dL — ABNORMAL LOW (ref 3.5–5.0)
Anion gap: 12 (ref 5–15)
BUN: 59 mg/dL — ABNORMAL HIGH (ref 8–23)
CO2: 23 mmol/L (ref 22–32)
Calcium: 9.2 mg/dL (ref 8.9–10.3)
Chloride: 116 mmol/L — ABNORMAL HIGH (ref 98–111)
Creatinine, Ser: 1.76 mg/dL — ABNORMAL HIGH (ref 0.61–1.24)
GFR calc Af Amer: 39 mL/min — ABNORMAL LOW (ref >60)
GFR calc non Af Amer: 34 mL/min — ABNORMAL LOW (ref >60)
Glucose, Bld: 163 mg/dL — ABNORMAL HIGH (ref 70–99)
Phosphorus: 3.1 mg/dL (ref 2.5–4.6)
Potassium: 3.6 mmol/L (ref 3.5–5.1)
Sodium: 151 mmol/L — ABNORMAL HIGH (ref 135–145)

## 2018-11-14 LAB — MAGNESIUM: Magnesium: 2.4 mg/dL (ref 1.7–2.4)

## 2018-11-14 MED ORDER — ORAL CARE MOUTH RINSE
15.0000 mL | Freq: Two times a day (BID) | OROMUCOSAL | Status: DC
  Administered 2018-11-14 – 2018-11-17 (×6): 15 mL via OROMUCOSAL

## 2018-11-14 MED ORDER — ENOXAPARIN SODIUM 30 MG/0.3ML ~~LOC~~ SOLN
30.0000 mg | SUBCUTANEOUS | Status: DC
  Administered 2018-11-15 – 2018-11-17 (×3): 30 mg via SUBCUTANEOUS
  Filled 2018-11-14 (×4): qty 0.3

## 2018-11-14 NOTE — Progress Notes (Signed)
Physical Therapy Treatment Patient Details Name: Anthony Hamilton MRN: [id number] DOB: 10-Sep-1927 Today's Date: 11/14/2018    History of Present Illness 85 y.o. male with medical history significant of hypertension, CKD 3, memory loss and cognitive dysfunction worsened over the past 6 months. He presented to the emergency room following a fall. Xray revealed L hip fx. He underwent L hemiarthroplasty 11/09/18.    PT Comments    Pt performed gait training from hallway back to room. He is motivated to mobilize and remains to require moderate assistance for transfers and gait training.  Pt continues to benefit from SNF placement to improve strength and function before returning home.  Will continue PT per POC.      Follow Up Recommendations  SNF;Supervision/Assistance - 24 hour     Equipment Recommendations  Rolling walker with 5" wheels    Recommendations for Other Services       Precautions / Restrictions Precautions Precautions: Posterior Hip;Fall Precaution Comments: abd pillow in bed Restrictions Weight Bearing Restrictions: Yes LLE Weight Bearing: Weight bearing as tolerated    Mobility  Bed Mobility Overal bed mobility: Needs Assistance Bed Mobility: Sit to Supine       Sit to supine: Supervision   General bed mobility comments: Pt able to lift B LEs back into bed unassisted.  Transfers Overall transfer level: Needs assistance Equipment used: Rolling walker (2 wheeled) Transfers: Sit to/from Stand Sit to Stand: Mod assist         General transfer comment: Cues for hand placement to push from seated surface.  Pt required max VCs to redirect and to task for hand placement.  Once in standing increased time to grab hand grips on RW.  Ambulation/Gait Ambulation/Gait assistance: Mod assist;+2 safety/equipment Gait Distance (Feet): 120 Feet Assistive device: Rolling walker (2 wheeled) Gait Pattern/deviations: Step-to pattern;Shuffle;Drifts right/left;Trunk  flexed;Step-through pattern     General Gait Details: Pt continues with short shuffling gt pattern.  Pt is able to progress speed but unable to improve step length.  His knees appear to be contracted which makes him ambulate with a flexed pattern.  Close chair follow needed.  Pt moving into shortened step through pattern.   Stairs             Wheelchair Mobility    Modified Rankin (Stroke Patients Only)       Balance Overall balance assessment: Needs assistance   Sitting balance-Leahy Scale: Poor Sitting balance - Comments: posterior bias     Standing balance-Leahy Scale: Poor                              Cognition Arousal/Alertness: Awake/alert Behavior During Therapy: WFL for tasks assessed/performed Overall Cognitive Status: History of cognitive impairments - at baseline                                 General Comments: Pt is impulsive but easy to redirect to follow commands.      Exercises      General Comments        Pertinent Vitals/Pain Pain Assessment: No/denies pain Faces Pain Scale: No hurt    Home Living                      Prior Function            PT Goals (current goals can now be  found in the care plan section) Acute Rehab PT Goals Patient Stated Goal: not stated PT Goal Formulation: Patient unable to participate in goal setting Potential to Achieve Goals: Fair    Frequency    Min 3X/week      PT Plan Current plan remains appropriate    Co-evaluation              AM-PAC PT "6 Clicks" Mobility   Outcome Measure  Help needed turning from your back to your side while in a flat bed without using bedrails?: A Lot Help needed moving from lying on your back to sitting on the side of a flat bed without using bedrails?: A Lot Help needed moving to and from a bed to a chair (including a wheelchair)?: A Lot Help needed standing up from a chair using your arms (e.g., wheelchair or bedside  chair)?: A Lot Help needed to walk in hospital room?: A Lot Help needed climbing 3-5 steps with a railing? : A Lot 6 Click Score: 12    End of Session Equipment Utilized During Treatment: Gait belt Activity Tolerance: Patient tolerated treatment well Patient left: in bed;with nursing/sitter in room;with bed alarm set;with restraints reapplied Nurse Communication: Mobility status PT Visit Diagnosis: Other abnormalities of gait and mobility (R26.89);Pain;History of falling (Z91.81) Pain - Right/Left: Left Pain - part of body: Hip     Time: 2330-0762 PT Time Calculation (min) (ACUTE ONLY): 14 min  Charges:  $Gait Training: 8-22 mins                     Anthony Hamilton, PTA Acute Rehabilitation Services Pager [phone] Office [phone]     Anthony Hamilton Eli Hose 11/14/2018, 4:39 PM

## 2018-11-14 NOTE — Plan of Care (Signed)

## 2018-11-14 NOTE — Plan of Care (Signed)
  Problem: Nutrition: Goal: Adequate nutrition will be maintained Outcome: Progressing   Problem: Coping: Goal: Level of anxiety will decrease Outcome: Progressing   Problem: Pain Managment: Goal: General experience of comfort will improve Outcome: Progressing   Problem: Safety: Goal: Ability to remain free from injury will improve Outcome: Progressing  Patient with safety sitter.

## 2018-11-14 NOTE — Progress Notes (Signed)
[name]  PROGRESS NOTE    Anthony Hamilton  EUM:[id number] DOB: 1927/07/19 DOA: 11/08/2018 PCP: Anthony Carol, MD   Brief Narrative:   Anthony Hamilton an 85 year old malewith past medical history significant forhypertension,CKD 3,memory loss and cognitive dysfunction said to have worsened over the past 6 months.  Depression presented to the emergency room following a fall.patient was admitted for further assessment and management.  Shunt underwent left hemiarthroplasty without complications.  Postoperative.  He has been significant for somnolence and drooling whilst on antipsychotic, as well as, needing ene-to-one observation.  The plan is to discharge patient back to skilled nursing facility once patient does not require one-to-one monitoring any longer.  Assessment & Plan:   Active Problems:   Essential hypertension   Hip fracture, unspecified laterality, closed, initial encounter ([facility])   Cognitive decline   Hip fracture ([place])   Left hip fracture      - s/p left hemiarthroplasty for neck fracture.     - PT/OT per ortho -Plan is for skilled nursing facility placement once patient is optimized.  Hypertension     - on zestoretic at home     - BP today is 135/67 mmHg.  CKD 3     - SCr improved on fluids, and possibly back to baseline.  Memory loss, cognitive decline     - B12 ok, TSH low; FT4 1.99; outpt follow up     - Decrease Seroquel to 12.5mg  po qhs (from 50 mg po qhs).  -DC Zofran -DC   DVT prophylaxis: lovenox Code Status: DNR   Disposition Plan: TBD   Consultants:   Ortho  Procedures:   Left hip hemiarthroplasty    Subjective: No fever or chills. No shortness of breath.  Objective: Vitals:   11/13/18 1946 11/13/18 1956 11/14/18 0357 11/14/18 0807  BP: (!) 166/77  (!) 154/68 116/66  Pulse: (!) 50 68 (!) 59 93  Resp: 15  15 16   Temp: 98.2 F (36.8 C)  97.8 F (36.6 C) 97.8 F (36.6 C)  TempSrc:    Oral  SpO2: 98%  97% 97%  Weight:       Height:        Intake/Output Summary (Last 24 hours) at 11/14/2018 1515 Last data filed at 11/14/2018 1300 Gross per 24 hour  Intake 440 ml  Output 200 ml  Net 240 ml   Filed Weights   11/09/18 2000  Weight: 72.2 kg    Examination:  General: Not in any distress. Awake. Cardiovascular:S1, S2,  Respiratory: CTABL, GI: Soft and non tender. Organs are not palpable. MSK: No leg edema. Neuro: Awake and alert. Seems to move all extremities.   Data Reviewed: I have personally reviewed following labs and imaging studies.  CBC: Recent Labs  Lab 11/08/18 1105  11/09/18 1141 11/10/18 0508 11/11/18 0552 11/12/18 0715 11/14/18 1007  WBC 14.5*   < > 15.2* 12.3* 10.9* 8.5 15.5*  NEUTROABS 13.2*  --   --   --   --   --  13.4*  HGB 13.2   < > 11.8* 10.3* 11.3* 10.0* 12.2*  HCT 39.5   < > 35.0* 29.9* 33.9* 30.4* 37.8*  MCV 89.8   < > 88.6 85.7 88.1 90.7 92.6  PLT 111*   < > 131* 127* 146* 154 244   < > = values in this interval not displayed.   Basic Metabolic Panel: Recent Labs  Lab 11/09/18 0338 11/09/18 1141 11/10/18 0508 11/11/18 0552 11/12/18 0715 11/14/18 1007  NA 139  --  139 143 147* 151*  K 3.6  --  3.7 3.7 3.8 3.6  CL 106  --  108 108 113* 116*  CO2 24  --  22 27 24 23   GLUCOSE 186*  --  149* 132* 137* 163*  BUN 35*  --  42* 35* 49* 59*  CREATININE 1.76* 1.76* 2.20* 1.83* 1.70* 1.76*  CALCIUM 8.4*  --  8.3* 8.8* 8.7* 9.2  MG  --   --   --  2.1 2.3 2.4  PHOS  --   --   --  2.8 4.0 3.1   GFR: Estimated Creatinine Clearance: 27.1 mL/min (A) (by C-G formula based on SCr of 1.76 mg/dL (H)). Liver Function Tests: Recent Labs  Lab 11/11/18 0552 11/12/18 0715 11/14/18 1007  ALBUMIN 3.0* 2.7* 3.3*   No results for input(s): LIPASE, AMYLASE in the last 168 hours. No results for input(s): AMMONIA in the last 168 hours. Coagulation Profile: Recent Labs  Lab 11/08/18 1105  INR 1.0   Cardiac Enzymes: No results for input(s): CKTOTAL, CKMB, CKMBINDEX,  TROPONINI in the last 168 hours. BNP (last 3 results) No results for input(s): PROBNP in the last 8760 hours. HbA1C: No results for input(s): HGBA1C in the last 72 hours. CBG: No results for input(s): GLUCAP in the last 168 hours. Lipid Profile: No results for input(s): CHOL, HDL, LDLCALC, TRIG, CHOLHDL, LDLDIRECT in the last 72 hours. Thyroid Function Tests: No results for input(s): TSH, T4TOTAL, FREET4, T3FREE, THYROIDAB in the last 72 hours. Anemia Panel: No results for input(s): VITAMINB12, FOLATE, FERRITIN, TIBC, IRON, RETICCTPCT in the last 72 hours. Sepsis Labs: No results for input(s): PROCALCITON, LATICACIDVEN in the last 168 hours.  Recent Results (from the past 240 hour(s))  SARS Coronavirus 2 (CEPHEID - Performed in [facility] hospital lab), Hosp Order     Status: None   Collection Time: 11/08/18 12:08 PM   Specimen: Nasopharyngeal Swab  Result Value Ref Range Status   SARS Coronavirus 2 NEGATIVE NEGATIVE Final    Comment: (NOTE) If result is NEGATIVE SARS-CoV-2 target nucleic acids are NOT DETECTED. The SARS-CoV-2 RNA is generally detectable in upper and lower  respiratory specimens during the acute phase of infection. The lowest  concentration of SARS-CoV-2 viral copies this assay can detect is 250  copies / mL. A negative result does not preclude SARS-CoV-2 infection  and should not be used as the sole basis for treatment or other  patient management decisions.  A negative result may occur with  improper specimen collection / handling, submission of specimen other  than nasopharyngeal swab, presence of viral mutation(s) within the  areas targeted by this assay, and inadequate number of viral copies  (<250 copies / mL). A negative result must be combined with clinical  observations, patient history, and epidemiological information. If result is POSITIVE SARS-CoV-2 target nucleic acids are DETECTED. The SARS-CoV-2 RNA is generally detectable in upper and lower   respiratory specimens dur ing the acute phase of infection.  Positive  results are indicative of active infection with SARS-CoV-2.  Clinical  correlation with patient history and other diagnostic information is  necessary to determine patient infection status.  Positive results do  not rule out bacterial infection or co-infection with other viruses. If result is PRESUMPTIVE POSTIVE SARS-CoV-2 nucleic acids MAY BE PRESENT.   A presumptive positive result was obtained on the submitted specimen  and confirmed on repeat testing.  While 2019 novel coronavirus  (SARS-CoV-2) nucleic acids  may be present in the submitted sample  additional confirmatory testing may be necessary for epidemiological  and / or clinical management purposes  to differentiate between  SARS-CoV-2 and other Sarbecovirus currently known to infect humans.  If clinically indicated additional testing with an alternate test  methodology [id number]) is advised. The SARS-CoV-2 RNA is generally  detectable in upper and lower respiratory sp ecimens during the acute  phase of infection. The expected result is Negative. Fact Sheet for Patients:  StrictlyIdeas.no Fact Sheet for Healthcare Providers: BankingDealers.co.za This test is not yet approved or cleared by the [place] FDA and has been authorized for detection and/or diagnosis of SARS-CoV-2 by FDA under an Emergency Use Authorization (EUA).  This EUA will remain in effect (meaning this test can be used) for the duration of the COVID-19 declaration under Section 564(b)(1) of the Act, 21 U.S.C. section 360bbb-3(b)(1), unless the authorization is terminated or revoked sooner. Performed at [facility], [place] [address], [place], [place] [address]   Surgical PCR screen     Status: None   Collection Time: 11/08/18 10:18 PM   Specimen: Nasal Mucosa; Nasal Swab  Result Value Ref Range Status   MRSA, PCR  NEGATIVE NEGATIVE Final   Staphylococcus aureus NEGATIVE NEGATIVE Final    Comment: (NOTE) The Xpert SA Assay (FDA approved for NASAL specimens in patients 60 years of age and older), is one component of a comprehensive surveillance program. It is not intended to diagnose infection nor to guide or monitor treatment. Performed at [facility], [place] [address] [place], [place] [address]   Novel Coronavirus, NAA (hospital order; send-out to ref lab)     Status: None   Collection Time: 11/09/18  3:35 AM   Specimen: Nasopharyngeal Swab; Respiratory  Result Value Ref Range Status   SARS-CoV-2, NAA NOT DETECTED NOT DETECTED Final    Comment: (NOTE) This test was developed and its performance characteristics determined by [employer]. This test has not been FDA cleared or approved. This test has been authorized by FDA under an Emergency Use Authorization (EUA). This test is only authorized for the duration of time the declaration that circumstances exist justifying the authorization of the emergency use of in vitro diagnostic tests for detection of SARS-CoV-2 virus and/or diagnosis of COVID-19 infection under section 564(b)(1) of the Act, 21 U.S.C. 482LMB-8(M)(7), unless the authorization is terminated or revoked sooner. When diagnostic testing is negative, the possibility of a false negative result should be considered in the context of a patient's recent exposures and the presence of clinical signs and symptoms consistent with COVID-19. An individual without symptoms of COVID-19 and who is not shedding SARS-CoV-2 virus would expect to have a negative (not detected) result in this assay. Performed  At: [facility] [place], [place] [id number] Rush Farmer MD FH:[id number]    [place]  Final    Comment: Performed at [facility] [address] [place], [place] [address]          Radiology Studies: Dg Chest Port 1 View  Result Date: 11/12/2018 CLINICAL DATA:  Pneumonia EXAM: PORTABLE CHEST 1 VIEW COMPARISON:  11/08/2018, 12/02/2006 FINDINGS: Probable small left effusion. Mild airspace disease at the left base. Mild cardiomegaly with aortic atherosclerosis. No pneumothorax. Small sclerotic foci in the right humerus and right scapula. IMPRESSION: Probable trace left effusion. Airspace disease at the left lung base may reflect atelectasis or a pneumonia Electronically Signed   By: Maudie Mercury  Francoise Ceo M.D.   On: 11/12/2018 22:15        Scheduled Meds: . docusate sodium  100 mg Oral BID  . enoxaparin (LOVENOX) injection  40 mg Subcutaneous Q24H  . feeding supplement (ENSURE ENLIVE)  237 mL Oral BID BM  . multivitamin with minerals  1 tablet Oral Daily  . QUEtiapine  12.5 mg Oral QHS   Continuous Infusions: . lactated ringers 10 mL/hr at 11/09/18 0831     LOS: 6 days    Time spent: 25 minutes spent in the coordination of care today.    Bonnell Public, MD [facility]  If 7PM-7AM, please contact night-coverage [email or web address] Password [facility] 11/14/2018, 3:15 PM

## 2018-11-14 NOTE — Progress Notes (Signed)
  Speech Language Pathology Treatment: Dysphagia  Patient Details Name: Anthony Hamilton MRN: [id number] DOB: 07-May-1928 Today's Date: 11/14/2018 Time: 8938-1017 SLP Time Calculation (min) (ACUTE ONLY): 13 min  Assessment / Plan / Recommendation Clinical Impression  Pt is alert and cooperative although quite distractible, needing Mod cues to redirect him to self-feeding. He has appropriate mastication of solids, but becomes distracted from posterior oral transit. He has minimal coughing with water, but also has signs of possible dysphagia including multiple, audible swallows, anterior spillage, throat clearing, and gagging. Outwardly, he appears to be discoordinated, although also question possible esophageal component. Recommend proceeding with MBS prior to making any diet advancements. Testing tentatively planned with radiology for this afternoon.   HPI HPI: ADARRIUS GRAEFF is a 85 y.o. male with medical history significant of hypertension, CKD 3, memory loss and cognitive dysfunction worsened over the past 6 months presented to the emergency room following a fall. Wife reports that he is physically very active, mows the lawn, does all the household activities around the house. Is now s/p left ARTHROPLASTY BIPOLAR HIP (HEMIARTHROPLASTY). Was observed to have difficulty swallowing with breakfast, orally holding/gurgling per note. No history of dysphagia in chart.         SLP Plan  MBS       Recommendations  Diet recommendations: Dysphagia 1 (puree);Nectar-thick liquid Liquids provided via: Cup Medication Administration: Crushed with puree Supervision: Staff to assist with self feeding;Full supervision/cueing for compensatory strategies;Trained caregiver to feed patient Compensations: Small sips/bites;Slow rate;Minimize environmental distractions Postural Changes and/or Swallow Maneuvers: Seated upright 90 degrees                Oral Care Recommendations: Oral care  BID Follow up Recommendations: Skilled Nursing facility SLP Visit Diagnosis: Dysphagia, oropharyngeal phase (R13.12) Plan: MBS       GO                Venita Sheffield Jamez Ambrocio 11/14/2018, 11:45 AM  Pollyann Glen, M.A. [place] Acute Environmental education officer [phone] Office [phone]

## 2018-11-14 NOTE — Progress Notes (Signed)
Modified Barium Swallow Progress Note  Patient Details  Name: Anthony Hamilton MRN: [id number] Date of Birth: 1928/01/04  Today's Date: 11/14/2018  Modified Barium Swallow completed.  Full report located under Chart Review in the Imaging Section.  Brief recommendations include the following:  Clinical Impression  Swallow study was limited by pt's mentation, as he was fully alert but cognitively had difficulty with oral acceptance particularly with liquids. Pt masticated purees for a moderately prolonged period of time. Despite use of cup or straw and with use of self-feeding, pt would primarily spit out liquids or let them spill anteriorly from his mouth. Trace amounts of liquids were swallowed without aspiration occurring, but assessment remains limited. When pt did swallow he had very limited hyolaryngeal movement, pharyngeal squeeze, epiglottic inflection, and base of tongue retraction. As a result, moderate-severe residue remained in the valleculae. At one point he started to penetrate residue of purees, but had spontaneous throat clearing that ejected it from the laryngeal vestibule. Could consider maintaining current diet as pt appears to be tolerating it from clinical review, but would be concerned about his nutritional needs given cognitive status and severity of dysphagia. Will continue to follow and make further assessments as able.   Swallow Evaluation Recommendations       SLP Diet Recommendations: Dysphagia 1 (Puree) solids;Nectar thick liquid   Liquid Administration via: Cup   Medication Administration: Crushed with puree   Supervision: Staff to assist with self feeding;Full supervision/cueing for compensatory strategies   Compensations: Small sips/bites;Slow rate;Minimize environmental distractions;Follow solids with liquid   Postural Changes: Seated upright at 90 degrees;Remain semi-upright after after feeds/meals (Comment)   Oral Care Recommendations: Oral care  BID   Other Recommendations: Order thickener from pharmacy;Prohibited food (jello, ice cream, thin soups);Remove water pitcher    Venita Sheffield Elanor Cale 11/14/2018,3:57 PM   Pollyann Glen, M.A. [place] Acute Environmental education officer [phone] Office [phone]

## 2018-11-14 NOTE — Progress Notes (Signed)
CSW called and spoke with [facility]. They need the patient to be sitter free for 24 hours before they are able to take him. The CSW informed Christa, admissions director, that they will DC the sitter on Friday and he should be ready by Saturday. Christa informed the CSW that they are not taking weekend admissions at this time and would be able to take him on Monday.   CSW will continue to follow and assist with discharge.   Domenic Schwab, MSW, [place]

## 2018-11-15 ENCOUNTER — Inpatient Hospital Stay (HOSPITAL_COMMUNITY): Payer: Medicare Other

## 2018-11-15 DIAGNOSIS — E870 Hyperosmolality and hypernatremia: Secondary | ICD-10-CM

## 2018-11-15 LAB — OSMOLALITY, URINE: Osmolality, Ur: 616 mOsm/kg (ref 300–900)

## 2018-11-15 LAB — SODIUM, URINE, RANDOM: Sodium, Ur: 23 mmol/L

## 2018-11-15 LAB — OSMOLALITY: Osmolality: 344 mOsm/kg (ref 275–295)

## 2018-11-15 MED ORDER — DEXTROSE 5 % IV SOLN
INTRAVENOUS | Status: DC
  Administered 2018-11-15: 18:00:00 via INTRAVENOUS

## 2018-11-15 MED ORDER — DOCUSATE SODIUM 50 MG/5ML PO LIQD
100.0000 mg | Freq: Two times a day (BID) | ORAL | Status: DC
  Administered 2018-11-15: 100 mg via ORAL
  Filled 2018-11-15 (×2): qty 10

## 2018-11-15 NOTE — Progress Notes (Signed)
Anthony Hamilton  PROGRESS NOTE    Anthony Hamilton  WUJ:[id number] DOB: April 05, 1928 DOA: 11/08/2018 PCP: Seward Carol, MD   Brief Narrative:   Anthony Hamilton an 85 year old malewith past medical history significant forhypertension,CKD 3,memory loss and cognitive dysfunction said to have worsened over the past 6 months.  Depression presented to the emergency room following a fall.patient was admitted for further assessment and management.  Shunt underwent left hemiarthroplasty without complications.  Postoperative.  He has been significant for somnolence and drooling whilst on antipsychotic, as well as, needing ene-to-one observation.  The plan is to discharge patient back to skilled nursing facility once patient does not require one-to-one monitoring any longer.  11/15/2018: Patient seen.  Patient looks better today.  Patient has been stable off of one-to-one observation.  However, sodium is 151 today.  Will start patient on D5 water.  We will also encourage free water.  We will check urine sodium, urine osmolality and serum osmolality.  Will repeat chest x-ray as he has leukocytosis.  However, no fever or chills.  No constitutional symptoms.  Worthy to mention that patient is a poor historian due to dementing process.  Assessment & Plan:   Active Problems:   Essential hypertension   Hip fracture, unspecified laterality, closed, initial encounter ([place])   Cognitive decline   Hip fracture ([place])   Left hip fracture      - s/p left hemiarthroplasty for neck fracture.     - PT/OT per ortho -Plan is for skilled nursing facility placement once patient is optimized.  Hypertension     - on zestoretic at home     - BP today is 135/67 mmHg.  Leukocytosis: Repeat chest x-ray.  Hypernatremia: Sodium is 151 today. See management plan as above.  CKD 3     - SCr improved on fluids, and possibly back to baseline.  Memory loss, cognitive decline     - B12 ok, TSH low; FT4 1.99; outpt  follow up     - Decrease Seroquel to 12.5mg  po qhs (from 50 mg po qhs).  -DC Zofran -DC   DVT prophylaxis: lovenox Code Status: DNR   Disposition Plan: TBD   Consultants:   Ortho  Procedures:   Left hip hemiarthroplasty    Subjective: No fever or chills. No shortness of breath.  Objective: Vitals:   11/14/18 2111 11/14/18 2223 11/15/18 0551 11/15/18 0806  BP: (!) 166/108 (!) 155/74 131/64 (!) 161/89  Pulse:   99 78  Resp:    16  Temp: 98.3 F (36.8 C)  98.2 F (36.8 C) 97.6 F (36.4 C)  TempSrc: Axillary  Axillary Axillary  SpO2: 90%  97% 99%  Weight:      Height:        Intake/Output Summary (Last 24 hours) at 11/15/2018 1352 Last data filed at 11/15/2018 0842 Gross per 24 hour  Intake 120 ml  Output 300 ml  Net -180 ml   Filed Weights   11/09/18 2000  Weight: 72.2 kg    Examination:  General: Not in any distress. Awake. Cardiovascular:S1, S2,  Respiratory: CTABL, GI: Soft and non tender. Organs are not palpable. MSK: No leg edema. Neuro: Awake and alert. Seems to move all extremities.   Data Reviewed: I have personally reviewed following labs and imaging studies.  CBC: Recent Labs  Lab 11/09/18 1141 11/10/18 0508 11/11/18 0552 11/12/18 0715 11/14/18 1007  WBC 15.2* 12.3* 10.9* 8.5 15.5*  NEUTROABS  --   --   --   --  13.4*  HGB 11.8* 10.3* 11.3* 10.0* 12.2*  HCT 35.0* 29.9* 33.9* 30.4* 37.8*  MCV 88.6 85.7 88.1 90.7 92.6  PLT 131* 127* 146* 154 160   Basic Metabolic Panel: Recent Labs  Lab 11/09/18 0338 11/09/18 1141 11/10/18 0508 11/11/18 0552 11/12/18 0715 11/14/18 1007  NA 139  --  139 143 147* 151*  K 3.6  --  3.7 3.7 3.8 3.6  CL 106  --  108 108 113* 116*  CO2 24  --  22 27 24 23   GLUCOSE 186*  --  149* 132* 137* 163*  BUN 35*  --  42* 35* 49* 59*  CREATININE 1.76* 1.76* 2.20* 1.83* 1.70* 1.76*  CALCIUM 8.4*  --  8.3* 8.8* 8.7* 9.2  MG  --   --   --  2.1 2.3 2.4  PHOS  --   --   --  2.8 4.0 3.1   GFR: Estimated  Creatinine Clearance: 27.1 mL/min (A) (by C-G formula based on SCr of 1.76 mg/dL (H)). Liver Function Tests: Recent Labs  Lab 11/11/18 0552 11/12/18 0715 11/14/18 1007  ALBUMIN 3.0* 2.7* 3.3*   No results for input(s): LIPASE, AMYLASE in the last 168 hours. No results for input(s): AMMONIA in the last 168 hours. Coagulation Profile: No results for input(s): INR, PROTIME in the last 168 hours. Cardiac Enzymes: No results for input(s): CKTOTAL, CKMB, CKMBINDEX, TROPONINI in the last 168 hours. BNP (last 3 results) No results for input(s): PROBNP in the last 8760 hours. HbA1C: No results for input(s): HGBA1C in the last 72 hours. CBG: No results for input(s): GLUCAP in the last 168 hours. Lipid Profile: No results for input(s): CHOL, HDL, LDLCALC, TRIG, CHOLHDL, LDLDIRECT in the last 72 hours. Thyroid Function Tests: No results for input(s): TSH, T4TOTAL, FREET4, T3FREE, THYROIDAB in the last 72 hours. Anemia Panel: No results for input(s): VITAMINB12, FOLATE, FERRITIN, TIBC, IRON, RETICCTPCT in the last 72 hours. Sepsis Labs: No results for input(s): PROCALCITON, LATICACIDVEN in the last 168 hours.  Recent Results (from the past 240 hour(s))  SARS Coronavirus 2 (CEPHEID - Performed in [place] hospital lab), Hosp Order     Status: None   Collection Time: 11/08/18 12:08 PM   Specimen: Nasopharyngeal Swab  Result Value Ref Range Status   SARS Coronavirus 2 NEGATIVE NEGATIVE Final    Comment: (NOTE) If result is NEGATIVE SARS-CoV-2 target nucleic acids are NOT DETECTED. The SARS-CoV-2 RNA is generally detectable in upper and lower  respiratory specimens during the acute phase of infection. The lowest  concentration of SARS-CoV-2 viral copies this assay can detect is 250  copies / mL. A negative result does not preclude SARS-CoV-2 infection  and should not be used as the sole basis for treatment or other  patient management decisions.  A negative result may occur with   improper specimen collection / handling, submission of specimen other  than nasopharyngeal swab, presence of viral mutation(s) within the  areas targeted by this assay, and inadequate number of viral copies  (<250 copies / mL). A negative result must be combined with clinical  observations, patient history, and epidemiological information. If result is POSITIVE SARS-CoV-2 target nucleic acids are DETECTED. The SARS-CoV-2 RNA is generally detectable in upper and lower  respiratory specimens dur ing the acute phase of infection.  Positive  results are indicative of active infection with SARS-CoV-2.  Clinical  correlation with patient history and other diagnostic information is  necessary to determine patient infection status.  Positive  results do  not rule out bacterial infection or co-infection with other viruses. If result is PRESUMPTIVE POSTIVE SARS-CoV-2 nucleic acids MAY BE PRESENT.   A presumptive positive result was obtained on the submitted specimen  and confirmed on repeat testing.  While 2019 novel coronavirus  (SARS-CoV-2) nucleic acids may be present in the submitted sample  additional confirmatory testing may be necessary for epidemiological  and / or clinical management purposes  to differentiate between  SARS-CoV-2 and other Sarbecovirus currently known to infect humans.  If clinically indicated additional testing with an alternate test  methodology [phone]) is advised. The SARS-CoV-2 RNA is generally  detectable in upper and lower respiratory sp ecimens during the acute  phase of infection. The expected result is Negative. Fact Sheet for Patients:  StrictlyIdeas.no Fact Sheet for Healthcare Providers: BankingDealers.co.za This test is not yet approved or cleared by the [place] FDA and has been authorized for detection and/or diagnosis of SARS-CoV-2 by FDA under an Emergency Use Authorization (EUA).  This EUA will  remain in effect (meaning this test can be used) for the duration of the COVID-19 declaration under Section 564(b)(1) of the Act, 21 U.S.C. section 360bbb-3(b)(1), unless the authorization is terminated or revoked sooner. Performed at [facility], [place] [address] [place], [place] [address]   Surgical PCR screen     Status: None   Collection Time: 11/08/18 10:18 PM   Specimen: Nasal Mucosa; Nasal Swab  Result Value Ref Range Status   MRSA, PCR NEGATIVE NEGATIVE Final   Staphylococcus aureus NEGATIVE NEGATIVE Final    Comment: (NOTE) The Xpert SA Assay (FDA approved for NASAL specimens in patients 70 years of age and older), is one component of a comprehensive surveillance program. It is not intended to diagnose infection nor to guide or monitor treatment. Performed at [facility], [place] [address] [place], [place] [address]   Novel Coronavirus, NAA (hospital order; send-out to ref lab)     Status: None   Collection Time: 11/09/18  3:35 AM   Specimen: Nasopharyngeal Swab; Respiratory  Result Value Ref Range Status   SARS-CoV-2, NAA NOT DETECTED NOT DETECTED Final    Comment: (NOTE) This test was developed and its performance characteristics determined by [employer]. This test has not been FDA cleared or approved. This test has been authorized by FDA under an Emergency Use Authorization (EUA). This test is only authorized for the duration of time the declaration that circumstances exist justifying the authorization of the emergency use of in vitro diagnostic tests for detection of SARS-CoV-2 virus and/or diagnosis of COVID-19 infection under section 564(b)(1) of the Act, 21 U.S.C. 468EHO-1(Y)(2), unless the authorization is terminated or revoked sooner. When diagnostic testing is negative, the possibility of a false negative result should be considered in the context of a patient's recent exposures and the presence of clinical signs  and symptoms consistent with COVID-19. An individual without symptoms of COVID-19 and who is not shedding SARS-CoV-2 virus would expect to have a negative (not detected) result in this assay. Performed  At: [facility] [place], [place] [id number] Rush Farmer MD WU:[id number]    [place]  Final    Comment: Performed at [place] [address], [place], [place] [address]         Radiology Studies: No results found.      Scheduled Meds: . docusate sodium  100 mg Oral BID  . enoxaparin (LOVENOX) injection  30  mg Subcutaneous Q24H  . feeding supplement (ENSURE ENLIVE)  237 mL Oral BID BM  . mouth rinse  15 mL Mouth Rinse BID  . multivitamin with minerals  1 tablet Oral Daily  . QUEtiapine  12.5 mg Oral QHS   Continuous Infusions: . dextrose    . lactated ringers 10 mL/hr at 11/09/18 0831     LOS: 7 days    Time spent: 25 minutes spent in the coordination of care today.    Bonnell Public, MD [facility]  If 7PM-7AM, please contact night-coverage [email or web address] Password [email or web address] 11/15/2018, 1:52 PM

## 2018-11-15 NOTE — Progress Notes (Addendum)
CRITICAL VALUE STICKER  CRITICAL VALUE: 344 osmalility  DATE & TIME NOTIFIED:  11/15/18 1500  MESSENGER (representative from lab): lab  MD NOTIFIED: yes  TIME OF NOTIFICATION: 03  RESPONSE: orders from MD

## 2018-11-15 NOTE — Progress Notes (Signed)
Unable to get urine sample due to pt continuously removing condom cath. Pt has now had 2 condom caths both pt removed. Will continue to monitor pt and try to collect urine sample from a new condom cath.

## 2018-11-15 NOTE — Plan of Care (Signed)
  Problem: Coping: Goal: Level of anxiety will decrease Outcome: Progressing   Problem: Pain Managment: Goal: General experience of comfort will improve Outcome: Progressing   Problem: Safety: Goal: Ability to remain free from injury will improve Outcome: Progressing   Problem: Skin Integrity: Goal: Risk for impaired skin integrity will decrease Outcome: Progressing   

## 2018-11-16 LAB — CBC WITH DIFFERENTIAL/PLATELET
Abs Immature Granulocytes: 0.24 10*3/uL — ABNORMAL HIGH (ref 0.00–0.07)
Basophils Absolute: 0 10*3/uL (ref 0.0–0.1)
Basophils Relative: 0 %
Eosinophils Absolute: 0 10*3/uL (ref 0.0–0.5)
Eosinophils Relative: 0 %
HCT: 35.7 % — ABNORMAL LOW (ref 39.0–52.0)
Hemoglobin: 11.3 g/dL — ABNORMAL LOW (ref 13.0–17.0)
Immature Granulocytes: 2 %
Lymphocytes Relative: 8 %
Lymphs Abs: 1.1 10*3/uL (ref 0.7–4.0)
MCH: 29.4 pg (ref 26.0–34.0)
MCHC: 31.7 g/dL (ref 30.0–36.0)
MCV: 92.7 fL (ref 80.0–100.0)
Monocytes Absolute: 1 10*3/uL (ref 0.1–1.0)
Monocytes Relative: 7 %
Neutro Abs: 11.1 10*3/uL — ABNORMAL HIGH (ref 1.7–7.7)
Neutrophils Relative %: 83 %
Platelets: 272 10*3/uL (ref 150–400)
RBC: 3.85 MIL/uL — ABNORMAL LOW (ref 4.22–5.81)
RDW: 13.5 % (ref 11.5–15.5)
WBC: 13.4 10*3/uL — ABNORMAL HIGH (ref 4.0–10.5)
nRBC: 0 % (ref 0.0–0.2)

## 2018-11-16 LAB — RENAL FUNCTION PANEL
Albumin: 3 g/dL — ABNORMAL LOW (ref 3.5–5.0)
Anion gap: 8 (ref 5–15)
BUN: 59 mg/dL — ABNORMAL HIGH (ref 8–23)
CO2: 27 mmol/L (ref 22–32)
Calcium: 8.8 mg/dL — ABNORMAL LOW (ref 8.9–10.3)
Chloride: 120 mmol/L — ABNORMAL HIGH (ref 98–111)
Creatinine, Ser: 1.86 mg/dL — ABNORMAL HIGH (ref 0.61–1.24)
GFR calc Af Amer: 37 mL/min — ABNORMAL LOW (ref >60)
GFR calc non Af Amer: 32 mL/min — ABNORMAL LOW (ref >60)
Glucose, Bld: 164 mg/dL — ABNORMAL HIGH (ref 70–99)
Phosphorus: 3.6 mg/dL (ref 2.5–4.6)
Potassium: 3.6 mmol/L (ref 3.5–5.1)
Sodium: 155 mmol/L — ABNORMAL HIGH (ref 135–145)

## 2018-11-16 LAB — MAGNESIUM: Magnesium: 2.5 mg/dL — ABNORMAL HIGH (ref 1.7–2.4)

## 2018-11-16 NOTE — Progress Notes (Signed)
TC from pt's wife and gave her an update on pt's POC and that he would be d/c to SNF tomorrow per [facility]. Pt also spoke with his wife. Will continue to monitor pt.

## 2018-11-16 NOTE — Progress Notes (Signed)
[name] Kitchen  PROGRESS NOTE    Anthony Hamilton  [id number]:[id number] DOB: 17-May-1928 DOA: 11/08/2018 PCP: Seward Carol, MD   Brief Narrative:   Anthony Hamilton an 85 year old malewith past medical history significant forhypertension,CKD 3,memory loss and cognitive dysfunction said to have worsened over the past 6 months.  Depression presented to the emergency room following a fall.patient was admitted for further assessment and management.  Shunt underwent left hemiarthroplasty without complications.  Postoperative.  He has been significant for somnolence and drooling whilst on antipsychotic, as well as, needing ene-to-one observation.  The plan is to discharge patient back to skilled nursing facility once patient does not require one-to-one monitoring any longer.  11/15/2018: Patient seen.  Patient looks better today.  Patient has been stable off of one-to-one observation.  However, sodium is 151 today.  Will start patient on D5 water.  We will also encourage free water.  We will check urine sodium, urine osmolality and serum osmolality.  Will repeat chest x-ray as he has leukocytosis.  However, no fever or chills.  No constitutional symptoms.  Worthy to mention that patient is a poor historian due to dementing process.  11/16/2018: Patient seen.  No new changes.  Awaiting disposition.  Assessment & Plan:   Active Problems:   Essential hypertension   Hip fracture, unspecified laterality, closed, initial encounter ([place])   Cognitive decline   Hip fracture ([place])   Left hip fracture      - s/p left hemiarthroplasty for neck fracture.     - PT/OT per ortho -Plan is for skilled nursing facility placement once patient is optimized.  Hypertension     -Continue to monitor closely.  Leukocytosis: Slowly resolving.  Hypernatremia: Sodium is 151 today. Free water.  CKD 3     -Continue to monitor closely.    Memory loss, cognitive decline     - B12 ok, TSH low; FT4 1.99; outpt  follow up     - Decrease Seroquel to 12.5mg  po qhs (from 50 mg po qhs).  -DC Zofran -DC   DVT prophylaxis: lovenox Code Status: DNR   Disposition Plan: TBD   Consultants:   Ortho  Procedures:   Left hip hemiarthroplasty    Subjective: No fever or chills. No shortness of breath.  Objective: Vitals:   11/15/18 1413 11/15/18 2024 11/16/18 0602 11/16/18 0838  BP: (!) 157/71 (!) 150/100 133/72 (!) 149/88  Pulse: (!) 106 (!) 105 96 92  Resp: 16   16  Temp:  98.3 F (36.8 C) 97.8 F (36.6 C)   TempSrc:  Axillary Axillary   SpO2:  99% 98% 100%  Weight:      Height:        Intake/Output Summary (Last 24 hours) at 11/16/2018 1100 Last data filed at 11/15/2018 1754 Gross per 24 hour  Intake 483.87 ml  Output -  Net 483.87 ml   Filed Weights   11/09/18 2000  Weight: 72.2 kg    Examination:  General: Not in any distress. Awake. Cardiovascular:S1, S2,  Respiratory: CTABL, GI: Soft and non tender. Organs are not palpable. MSK: No leg edema. Neuro: Awake and alert. Seems to move all extremities.   Data Reviewed: I have personally reviewed following labs and imaging studies.  CBC: Recent Labs  Lab 11/10/18 0508 11/11/18 0552 11/12/18 0715 11/14/18 1007 11/16/18 0445  WBC 12.3* 10.9* 8.5 15.5* 13.4*  NEUTROABS  --   --   --  13.4* 11.1*  HGB 10.3* 11.3* 10.0* 12.2* 11.3*  HCT 29.9* 33.9* 30.4* 37.8* 35.7*  MCV 85.7 88.1 90.7 92.6 92.7  PLT 127* 146* 154 244 924   Basic Metabolic Panel: Recent Labs  Lab 11/10/18 0508 11/11/18 0552 11/12/18 0715 11/14/18 1007 11/16/18 0445  NA 139 143 147* 151* 155*  K 3.7 3.7 3.8 3.6 3.6  CL 108 108 113* 116* 120*  CO2 22 27 24 23 27   GLUCOSE 149* 132* 137* 163* 164*  BUN 42* 35* 49* 59* 59*  CREATININE 2.20* 1.83* 1.70* 1.76* 1.86*  CALCIUM 8.3* 8.8* 8.7* 9.2 8.8*  MG  --  2.1 2.3 2.4 2.5*  PHOS  --  2.8 4.0 3.1 3.6   GFR: Estimated Creatinine Clearance: 25.7 mL/min (A) (by C-G formula based on SCr of 1.86  mg/dL (H)). Liver Function Tests: Recent Labs  Lab 11/11/18 0552 11/12/18 0715 11/14/18 1007 11/16/18 0445  ALBUMIN 3.0* 2.7* 3.3* 3.0*   No results for input(s): LIPASE, AMYLASE in the last 168 hours. No results for input(s): AMMONIA in the last 168 hours. Coagulation Profile: No results for input(s): INR, PROTIME in the last 168 hours. Cardiac Enzymes: No results for input(s): CKTOTAL, CKMB, CKMBINDEX, TROPONINI in the last 168 hours. BNP (last 3 results) No results for input(s): PROBNP in the last 8760 hours. HbA1C: No results for input(s): HGBA1C in the last 72 hours. CBG: No results for input(s): GLUCAP in the last 168 hours. Lipid Profile: No results for input(s): CHOL, HDL, LDLCALC, TRIG, CHOLHDL, LDLDIRECT in the last 72 hours. Thyroid Function Tests: No results for input(s): TSH, T4TOTAL, FREET4, T3FREE, THYROIDAB in the last 72 hours. Anemia Panel: No results for input(s): VITAMINB12, FOLATE, FERRITIN, TIBC, IRON, RETICCTPCT in the last 72 hours. Sepsis Labs: No results for input(s): PROCALCITON, LATICACIDVEN in the last 168 hours.  Recent Results (from the past 240 hour(s))  SARS Coronavirus 2 (CEPHEID - Performed in [place] hospital lab), Hosp Order     Status: None   Collection Time: 11/08/18 12:08 PM   Specimen: Nasopharyngeal Swab  Result Value Ref Range Status   SARS Coronavirus 2 NEGATIVE NEGATIVE Final    Comment: (NOTE) If result is NEGATIVE SARS-CoV-2 target nucleic acids are NOT DETECTED. The SARS-CoV-2 RNA is generally detectable in upper and lower  respiratory specimens during the acute phase of infection. The lowest  concentration of SARS-CoV-2 viral copies this assay can detect is 250  copies / mL. A negative result does not preclude SARS-CoV-2 infection  and should not be used as the sole basis for treatment or other  patient management decisions.  A negative result may occur with  improper specimen collection / handling, submission of  specimen other  than nasopharyngeal swab, presence of viral mutation(s) within the  areas targeted by this assay, and inadequate number of viral copies  (<250 copies / mL). A negative result must be combined with clinical  observations, patient history, and epidemiological information. If result is POSITIVE SARS-CoV-2 target nucleic acids are DETECTED. The SARS-CoV-2 RNA is generally detectable in upper and lower  respiratory specimens dur ing the acute phase of infection.  Positive  results are indicative of active infection with SARS-CoV-2.  Clinical  correlation with patient history and other diagnostic information is  necessary to determine patient infection status.  Positive results do  not rule out bacterial infection or co-infection with other viruses. If result is PRESUMPTIVE POSTIVE SARS-CoV-2 nucleic acids MAY BE PRESENT.   A presumptive positive result was obtained on the submitted specimen  and confirmed on  repeat testing.  While 2019 novel coronavirus  (SARS-CoV-2) nucleic acids may be present in the submitted sample  additional confirmatory testing may be necessary for epidemiological  and / or clinical management purposes  to differentiate between  SARS-CoV-2 and other Sarbecovirus currently known to infect humans.  If clinically indicated additional testing with an alternate test  methodology [phone]) is advised. The SARS-CoV-2 RNA is generally  detectable in upper and lower respiratory sp ecimens during the acute  phase of infection. The expected result is Negative. Fact Sheet for Patients:  StrictlyIdeas.no Fact Sheet for Healthcare Providers: BankingDealers.co.za This test is not yet approved or cleared by the [place] FDA and has been authorized for detection and/or diagnosis of SARS-CoV-2 by FDA under an Emergency Use Authorization (EUA).  This EUA will remain in effect (meaning this test can be used) for the  duration of the COVID-19 declaration under Section 564(b)(1) of the Act, 21 U.S.C. section 360bbb-3(b)(1), unless the authorization is terminated or revoked sooner. Performed at [facility], [place] [address] [place], [place] [address]   Surgical PCR screen     Status: None   Collection Time: 11/08/18 10:18 PM   Specimen: Nasal Mucosa; Nasal Swab  Result Value Ref Range Status   MRSA, PCR NEGATIVE NEGATIVE Final   Staphylococcus aureus NEGATIVE NEGATIVE Final    Comment: (NOTE) The Xpert SA Assay (FDA approved for NASAL specimens in patients 31 years of age and older), is one component of a comprehensive surveillance program. It is not intended to diagnose infection nor to guide or monitor treatment. Performed at [facility], [place] [address], [place], [place] [address]   Novel Coronavirus, NAA (hospital order; send-out to ref lab)     Status: None   Collection Time: 11/09/18  3:35 AM   Specimen: Nasopharyngeal Swab; Respiratory  Result Value Ref Range Status   SARS-CoV-2, NAA NOT DETECTED NOT DETECTED Final    Comment: (NOTE) This test was developed and its performance characteristics determined by [employer]. This test has not been FDA cleared or approved. This test has been authorized by FDA under an Emergency Use Authorization (EUA). This test is only authorized for the duration of time the declaration that circumstances exist justifying the authorization of the emergency use of in vitro diagnostic tests for detection of SARS-CoV-2 virus and/or diagnosis of COVID-19 infection under section 564(b)(1) of the Act, 21 U.S.C. 188CZY-6(A)(6), unless the authorization is terminated or revoked sooner. When diagnostic testing is negative, the possibility of a false negative result should be considered in the context of a patient's recent exposures and the presence of clinical signs and symptoms consistent with COVID-19. An individual  without symptoms of COVID-19 and who is not shedding SARS-CoV-2 virus would expect to have a negative (not detected) result in this assay. Performed  At: [facility] [place], [place] [id number] Rush Farmer MD AT:[id number]    [place]  Final    Comment: Performed at [facility] [address], [place], [place] [address]         Radiology Studies: Dg Chest Port 1 View  Result Date: 11/15/2018 CLINICAL DATA:  Pneumonia.  Recent fall. EXAM: PORTABLE CHEST 1 VIEW COMPARISON:  11/12/2018 FINDINGS: Lungs are adequately inflated with improved left base opacification with only subtle left retrocardiac density. No effusion. Cardiomediastinal silhouette and remainder of the exam is unchanged. No fracture. IMPRESSION: Improving left base opacification likely improving infection. Electronically Signed  By: Marin Olp M.D.   On: 11/15/2018 16:56        Scheduled Meds: . docusate  100 mg Oral BID  . enoxaparin (LOVENOX) injection  30 mg Subcutaneous Q24H  . feeding supplement (ENSURE ENLIVE)  237 mL Oral BID BM  . mouth rinse  15 mL Mouth Rinse BID  . multivitamin with minerals  1 tablet Oral Daily  . QUEtiapine  12.5 mg Oral QHS   Continuous Infusions: . dextrose 75 mL/hr at 11/15/18 1754  . lactated ringers 10 mL/hr at 11/09/18 0831     LOS: 8 days    Time spent: 25 minutes spent in the coordination of care today.    Bonnell Public, MD [facility]  If 7PM-7AM, please contact night-coverage [email or web address] Password [email or web address] 11/16/2018, 11:00 AM

## 2018-11-17 DIAGNOSIS — F329 Major depressive disorder, single episode, unspecified: Secondary | ICD-10-CM | POA: Diagnosis not present

## 2018-11-17 DIAGNOSIS — Z515 Encounter for palliative care: Secondary | ICD-10-CM | POA: Diagnosis not present

## 2018-11-17 DIAGNOSIS — I129 Hypertensive chronic kidney disease with stage 1 through stage 4 chronic kidney disease, or unspecified chronic kidney disease: Secondary | ICD-10-CM | POA: Diagnosis present

## 2018-11-17 DIAGNOSIS — R41841 Cognitive communication deficit: Secondary | ICD-10-CM | POA: Diagnosis not present

## 2018-11-17 DIAGNOSIS — N3289 Other specified disorders of bladder: Secondary | ICD-10-CM | POA: Diagnosis present

## 2018-11-17 DIAGNOSIS — S72009A Fracture of unspecified part of neck of unspecified femur, initial encounter for closed fracture: Secondary | ICD-10-CM | POA: Diagnosis not present

## 2018-11-17 DIAGNOSIS — N183 Chronic kidney disease, stage 3 (moderate): Secondary | ICD-10-CM | POA: Diagnosis present

## 2018-11-17 DIAGNOSIS — Z96642 Presence of left artificial hip joint: Secondary | ICD-10-CM | POA: Diagnosis present

## 2018-11-17 DIAGNOSIS — R000 Tachycardia, unspecified: Secondary | ICD-10-CM | POA: Diagnosis not present

## 2018-11-17 DIAGNOSIS — R319 Hematuria, unspecified: Secondary | ICD-10-CM | POA: Diagnosis present

## 2018-11-17 DIAGNOSIS — Z79899 Other long term (current) drug therapy: Secondary | ICD-10-CM | POA: Diagnosis not present

## 2018-11-17 DIAGNOSIS — R52 Pain, unspecified: Secondary | ICD-10-CM | POA: Diagnosis not present

## 2018-11-17 DIAGNOSIS — Z03818 Encounter for observation for suspected exposure to other biological agents ruled out: Secondary | ICD-10-CM | POA: Diagnosis not present

## 2018-11-17 DIAGNOSIS — M2550 Pain in unspecified joint: Secondary | ICD-10-CM | POA: Diagnosis not present

## 2018-11-17 DIAGNOSIS — Z66 Do not resuscitate: Secondary | ICD-10-CM | POA: Diagnosis present

## 2018-11-17 DIAGNOSIS — G9341 Metabolic encephalopathy: Secondary | ICD-10-CM | POA: Diagnosis present

## 2018-11-17 DIAGNOSIS — Z1159 Encounter for screening for other viral diseases: Secondary | ICD-10-CM | POA: Diagnosis not present

## 2018-11-17 DIAGNOSIS — Z9181 History of falling: Secondary | ICD-10-CM | POA: Diagnosis not present

## 2018-11-17 DIAGNOSIS — R4182 Altered mental status, unspecified: Secondary | ICD-10-CM

## 2018-11-17 DIAGNOSIS — G3184 Mild cognitive impairment, so stated: Secondary | ICD-10-CM | POA: Diagnosis not present

## 2018-11-17 DIAGNOSIS — S72002D Fracture of unspecified part of neck of left femur, subsequent encounter for closed fracture with routine healing: Secondary | ICD-10-CM | POA: Diagnosis not present

## 2018-11-17 DIAGNOSIS — D649 Anemia, unspecified: Secondary | ICD-10-CM | POA: Diagnosis present

## 2018-11-17 DIAGNOSIS — S79929A Unspecified injury of unspecified thigh, initial encounter: Secondary | ICD-10-CM | POA: Diagnosis not present

## 2018-11-17 DIAGNOSIS — I959 Hypotension, unspecified: Secondary | ICD-10-CM | POA: Diagnosis present

## 2018-11-17 DIAGNOSIS — R4189 Other symptoms and signs involving cognitive functions and awareness: Secondary | ICD-10-CM | POA: Diagnosis not present

## 2018-11-17 DIAGNOSIS — F0280 Dementia in other diseases classified elsewhere without behavioral disturbance: Secondary | ICD-10-CM | POA: Diagnosis not present

## 2018-11-17 DIAGNOSIS — Z7901 Long term (current) use of anticoagulants: Secondary | ICD-10-CM | POA: Diagnosis not present

## 2018-11-17 DIAGNOSIS — S72042D Displaced fracture of base of neck of left femur, subsequent encounter for closed fracture with routine healing: Secondary | ICD-10-CM | POA: Diagnosis not present

## 2018-11-17 DIAGNOSIS — R5381 Other malaise: Secondary | ICD-10-CM | POA: Diagnosis not present

## 2018-11-17 DIAGNOSIS — N179 Acute kidney failure, unspecified: Secondary | ICD-10-CM | POA: Diagnosis not present

## 2018-11-17 DIAGNOSIS — R0689 Other abnormalities of breathing: Secondary | ICD-10-CM | POA: Diagnosis not present

## 2018-11-17 DIAGNOSIS — E870 Hyperosmolality and hypernatremia: Secondary | ICD-10-CM | POA: Diagnosis not present

## 2018-11-17 DIAGNOSIS — M6281 Muscle weakness (generalized): Secondary | ICD-10-CM | POA: Diagnosis not present

## 2018-11-17 DIAGNOSIS — Z79891 Long term (current) use of opiate analgesic: Secondary | ICD-10-CM | POA: Diagnosis not present

## 2018-11-17 DIAGNOSIS — E861 Hypovolemia: Secondary | ICD-10-CM | POA: Diagnosis present

## 2018-11-17 DIAGNOSIS — I10 Essential (primary) hypertension: Secondary | ICD-10-CM | POA: Diagnosis not present

## 2018-11-17 DIAGNOSIS — Z7401 Bed confinement status: Secondary | ICD-10-CM | POA: Diagnosis not present

## 2018-11-17 DIAGNOSIS — R404 Transient alteration of awareness: Secondary | ICD-10-CM | POA: Diagnosis not present

## 2018-11-17 DIAGNOSIS — E860 Dehydration: Secondary | ICD-10-CM

## 2018-11-17 DIAGNOSIS — R1312 Dysphagia, oropharyngeal phase: Secondary | ICD-10-CM | POA: Diagnosis not present

## 2018-11-17 DIAGNOSIS — Z7982 Long term (current) use of aspirin: Secondary | ICD-10-CM | POA: Diagnosis not present

## 2018-11-17 DIAGNOSIS — N170 Acute kidney failure with tubular necrosis: Secondary | ICD-10-CM | POA: Diagnosis not present

## 2018-11-17 DIAGNOSIS — G9340 Encephalopathy, unspecified: Secondary | ICD-10-CM | POA: Diagnosis not present

## 2018-11-17 DIAGNOSIS — R419 Unspecified symptoms and signs involving cognitive functions and awareness: Secondary | ICD-10-CM | POA: Diagnosis not present

## 2018-11-17 MED ORDER — ENSURE ENLIVE PO LIQD: 237.0000 mL | Freq: Two times a day (BID) | ORAL | 12 refills | Status: DC

## 2018-11-17 MED ORDER — QUETIAPINE FUMARATE 25 MG PO TABS: 12.5000 mg | ORAL_TABLET | Freq: Every day | ORAL | 0 refills | Status: DC

## 2018-11-17 MED ORDER — DOCUSATE SODIUM 50 MG/5ML PO LIQD: 100.0000 mg | Freq: Two times a day (BID) | ORAL | 0 refills | Status: DC

## 2018-11-17 MED ORDER — ADULT MULTIVITAMIN W/MINERALS CH: 1.0000 | ORAL_TABLET | Freq: Every day | ORAL | 0 refills | Status: AC

## 2018-11-17 NOTE — Progress Notes (Signed)
RN updated pt's wife on pt's status and discharge plan. All questions answered to satisfaction. Will continue to monitor.

## 2018-11-17 NOTE — Plan of Care (Signed)
Problem: Education: Goal: Knowledge of General Education information will improve Description: Including pain rating scale, medication(s)/side effects and non-pharmacologic comfort measures Outcome: Progressing   Problem: Health Behavior/Discharge Planning: Goal: Ability to manage health-related needs will improve Outcome: Progressing   Problem: Clinical Measurements: Goal: Respiratory complications will improve Outcome: Progressing Goal: Cardiovascular complication will be avoided Outcome: Progressing   Problem: Safety: Goal: Ability to remain free from injury will improve Outcome: Progressing   Problem: Skin Integrity: Goal: Risk for impaired skin integrity will decrease Outcome: Progressing

## 2018-11-17 NOTE — Discharge Summary (Signed)
Physician Discharge Summary  Patient ID: Anthony Hamilton MRN: [id number] DOB/AGE: 83-May-1929 83 y.o.  Admit date: 11/08/2018 Discharge date: 11/17/2018  Admission Diagnoses:  Discharge Diagnoses:  Active Problems:   Essential hypertension   Left hip fracture, closed, initial encounter ([place])   Cognitive decline   Hip fracture [facility])  Discharged Condition: stable  Hospital Course: Patient is an 85 year old Caucasian male with past medical history significant for hypertension,CKD 3,memory loss and cognitive dysfunction said to have worsened over the past 6 months and depression.  Patient presented to the emergency room following a fall.  Patient was admitted for further assessment and management.  Patient underwent left hemiarthroplasty.  Postop. period was significant for altered mentation.  The level of altered mentation was psych that patient required one-to-one observation.  Patient was started on low-dose quetiapine.  Altered mentation has resolved significantly.  Patient has not required one-to-one observation in the last 48 hours.  Patient has been cleared for discharge by the orthopedic team.  Patient will be discharged to skilled nursing facility.    Left hip fracture  - s/p left hemiarthroplasty for femoral neck fracture. -Orthopedic team directed care.  Patient has been cleared for discharge by the orthopedic team.        -Patient will be discharged to skilled nursing facility.    Hypertension: -Continue to monitor closely.     -Antihypertensives are currently on hold.  Leukocytosis: -Slowly resolving.  Hypernatremia/dehydration: Last sodium checked was 151.   Encourage free water.   Monitor renal function, electrolytes and volume status.    CKD 3 -Continue to monitor closely.    Postop altered mental status: Resolved significantly. Continue low-dose quetiapine for the next 1 week if deemed necessary. Currently reviewed the need to  continue quetiapine afterwards.  Memory loss, cognitive decline: -Manage expectantly.     Consults: orthopedic surgery  Significant Diagnostic Studies:  Left hip x-ray revealed "Acute displaced left hip subcapital femoral neck fracture".  Treatments: Left hip hemiarthroplasty  Discharge Exam: Blood pressure 136/78, pulse 97, temperature 97.8 F (36.6 C), temperature source Axillary, resp. rate 14, height 5\' 7"  (1.702 m), weight 72.2 kg, SpO2 100 %.   Disposition: Discharge disposition: 03-Skilled Nursing Facility   Discharge Instructions    Diet - low sodium heart healthy   Complete by: As directed    Increase activity slowly   Complete by: As directed      Allergies as of 11/17/2018   No Known Allergies     Medication List    STOP taking these medications   lisinopril-hydrochlorothiazide 10-12.5 MG tablet Commonly known as: ZESTORETIC     TAKE these medications   acetaminophen 500 MG tablet Commonly known as: TYLENOL Take 2 tablets (1,000 mg total) by mouth every 8 (eight) hours for 14 days.   aspirin EC 81 MG tablet Take 81 mg by mouth daily.   docusate 50 MG/5ML liquid Commonly known as: COLACE Take 10 mLs (100 mg total) by mouth 2 (two) times daily.   enoxaparin 40 MG/0.4ML injection Commonly known as: LOVENOX Inject 0.4 mLs (40 mg total) into the skin daily.   feeding supplement (ENSURE ENLIVE) Liqd Take 237 mLs by mouth 2 (two) times daily between meals.   multivitamin with minerals Tabs tablet Take 1 tablet by mouth daily. Start taking on: November 18, 2018   ondansetron 4 MG tablet Commonly known as: Zofran Take 1 tablet (4 mg total) by mouth every 8 (eight) hours as needed for up to 7 days for  nausea or vomiting.   oxyCODONE 5 MG immediate release tablet Commonly known as: Oxy IR/ROXICODONE Take 1 pills every 4-6 hrs as needed for pain   QUEtiapine 25 MG tablet Commonly known as: SEROQUEL Take 0.5 tablets (12.5 mg total) by mouth at  bedtime for 7 days.       Contact information for follow-up providers    Hiram Gash, MD In 2 weeks.   Specialty: Orthopedic Surgery Why: For wound re-check Contact information: [address] [address] [phone]            Contact information for after-discharge care    [facility] .   Service: Skilled Nursing Contact information: [address] [place] [phone]                  Signed: Bonnell Public 11/17/2018, 11:20 AM

## 2018-11-17 NOTE — TOC Transition Note (Signed)
Transition of Care [facility]) - CM/SW Discharge Note   Patient Details  Name: [name] MRN: [id number] Date of Birth: 1927-11-11  Transition of Care [facility]) CM/SW Contact:  Alexander [place], [place] Phone Number: 11/17/2018, 2:16 PM   Clinical Narrative:    Pt stable for dc, printed script for controlled substance on chart; CSW requested RN check for signed DNR form.   Pt wife called and voicemail left.  [facility] called for 3:30pm.    Final next level of care: [place] Barriers to Discharge: Barriers Resolved   Patient Goals and CMS Choice Patient states their goals for this hospitalization and ongoing recovery are:: SNF CMS Medicare.gov Compare Post Acute Care list provided to:: Patient Represenative (must comment)(pt wife) Choice offered to / list presented to : Spouse  Discharge Placement PASRR number recieved: 11/10/18            Patient chooses bed at: [facility] Patient to be transferred to facility by: [place] Name of family member notified: pt wife Patient and family notified of of transfer: 11/17/18  Discharge Plan and Services     Post Acute Care Choice: [place]                               Social Determinants of Health (SDOH) Interventions     Readmission Risk Interventions No flowsheet data found.

## 2018-11-17 NOTE — Plan of Care (Signed)
  Problem: Pain Managment: Goal: General experience of comfort will improve Outcome: Progressing   Problem: Safety: Goal: Ability to remain free from injury will improve Outcome: Progressing   Problem: Skin Integrity: Goal: Risk for impaired skin integrity will decrease Outcome: Progressing   

## 2018-11-17 NOTE — Social Work (Signed)
Clinical Social Worker facilitated patient discharge including contacting patient family and facility to confirm patient discharge plans.  Clinical information faxed to facility and family agreeable with plan.  CSW arranged ambulance transport via [facility] to [facility]. RN to call [phone]  with report prior to discharge.  Clinical Social Worker will sign off for now as social work intervention is no longer needed. Please consult us again if new need arises.  Dreana Britz, MSW, LCSWA Clinical Social Worker [phone]  

## 2018-11-17 NOTE — Progress Notes (Signed)
AVS, printed prescriptions, signed DNR form, and social worker's paperwork placed in discharge packet. Reported nurse unavailable for report. Christa from admissions at [facility] received report. All questions answered to satisfaction. [facility] transported pt. Will continue to monitor.

## 2018-11-18 ENCOUNTER — Encounter: Payer: Self-pay | Admitting: Internal Medicine

## 2018-11-18 ENCOUNTER — Encounter (HOSPITAL_COMMUNITY): Payer: Self-pay | Admitting: Emergency Medicine

## 2018-11-18 ENCOUNTER — Other Ambulatory Visit: Payer: Self-pay

## 2018-11-18 ENCOUNTER — Inpatient Hospital Stay (HOSPITAL_COMMUNITY)
Admission: EM | Admit: 2018-11-18 | Discharge: 2018-11-23 | DRG: 682 | Disposition: A | Discharge status: Skilled Nursing Facility | Payer: Medicare Other | Attending: Family Medicine | Admitting: Family Medicine

## 2018-11-18 ENCOUNTER — Non-Acute Institutional Stay (SKILLED_NURSING_FACILITY): Payer: Medicare Other | Admitting: Internal Medicine

## 2018-11-18 DIAGNOSIS — S72002D Fracture of unspecified part of neck of left femur, subsequent encounter for closed fracture with routine healing: Secondary | ICD-10-CM | POA: Diagnosis not present

## 2018-11-18 DIAGNOSIS — D649 Anemia, unspecified: Secondary | ICD-10-CM | POA: Diagnosis present

## 2018-11-18 DIAGNOSIS — N183 Chronic kidney disease, stage 3 unspecified: Secondary | ICD-10-CM

## 2018-11-18 DIAGNOSIS — G9341 Metabolic encephalopathy: Secondary | ICD-10-CM | POA: Diagnosis not present

## 2018-11-18 DIAGNOSIS — I10 Essential (primary) hypertension: Secondary | ICD-10-CM | POA: Diagnosis not present

## 2018-11-18 DIAGNOSIS — E870 Hyperosmolality and hypernatremia: Secondary | ICD-10-CM | POA: Diagnosis not present

## 2018-11-18 DIAGNOSIS — Z66 Do not resuscitate: Secondary | ICD-10-CM | POA: Diagnosis present

## 2018-11-18 DIAGNOSIS — E860 Dehydration: Secondary | ICD-10-CM | POA: Diagnosis present

## 2018-11-18 DIAGNOSIS — F0280 Dementia in other diseases classified elsewhere without behavioral disturbance: Secondary | ICD-10-CM | POA: Diagnosis present

## 2018-11-18 DIAGNOSIS — N179 Acute kidney failure, unspecified: Secondary | ICD-10-CM | POA: Diagnosis not present

## 2018-11-18 DIAGNOSIS — Z1159 Encounter for screening for other viral diseases: Secondary | ICD-10-CM

## 2018-11-18 DIAGNOSIS — R4189 Other symptoms and signs involving cognitive functions and awareness: Secondary | ICD-10-CM | POA: Diagnosis not present

## 2018-11-18 DIAGNOSIS — A419 Sepsis, unspecified organism: Secondary | ICD-10-CM | POA: Diagnosis not present

## 2018-11-18 DIAGNOSIS — Z7901 Long term (current) use of anticoagulants: Secondary | ICD-10-CM

## 2018-11-18 DIAGNOSIS — Z03818 Encounter for observation for suspected exposure to other biological agents ruled out: Secondary | ICD-10-CM | POA: Diagnosis not present

## 2018-11-18 DIAGNOSIS — R319 Hematuria, unspecified: Secondary | ICD-10-CM | POA: Diagnosis present

## 2018-11-18 DIAGNOSIS — Z79891 Long term (current) use of opiate analgesic: Secondary | ICD-10-CM

## 2018-11-18 DIAGNOSIS — E861 Hypovolemia: Secondary | ICD-10-CM | POA: Diagnosis present

## 2018-11-18 DIAGNOSIS — Z515 Encounter for palliative care: Secondary | ICD-10-CM | POA: Diagnosis not present

## 2018-11-18 DIAGNOSIS — Z79899 Other long term (current) drug therapy: Secondary | ICD-10-CM

## 2018-11-18 DIAGNOSIS — L8990 Pressure ulcer of unspecified site, unspecified stage: Secondary | ICD-10-CM | POA: Insufficient documentation

## 2018-11-18 DIAGNOSIS — I959 Hypotension, unspecified: Secondary | ICD-10-CM | POA: Diagnosis present

## 2018-11-18 DIAGNOSIS — I129 Hypertensive chronic kidney disease with stage 1 through stage 4 chronic kidney disease, or unspecified chronic kidney disease: Secondary | ICD-10-CM | POA: Diagnosis present

## 2018-11-18 DIAGNOSIS — Z96642 Presence of left artificial hip joint: Secondary | ICD-10-CM | POA: Diagnosis present

## 2018-11-18 DIAGNOSIS — N3289 Other specified disorders of bladder: Secondary | ICD-10-CM | POA: Diagnosis present

## 2018-11-18 DIAGNOSIS — Z7982 Long term (current) use of aspirin: Secondary | ICD-10-CM

## 2018-11-18 DIAGNOSIS — G9340 Encephalopathy, unspecified: Secondary | ICD-10-CM | POA: Diagnosis present

## 2018-11-18 NOTE — Progress Notes (Signed)
   NURSING HOME LOCATION:  [facility] ROOM NUMBER:    CODE STATUS: DNR  PCP: Dr Seward Carol  This is a comprehensive admission note to [facility] performed on this date less than 30 days from date of admission. Included are preadmission medical/surgical history; reconciled medication list; family history; social history and comprehensive review of systems.  Corrections and additions to the records were documented. Comprehensive physical exam was also performed. Additionally a clinical summary was entered for each active diagnosis pertinent to this admission in the Problem List to enhance continuity of care.  HPI: The patient was hospitalized 6/13-6/22/2020 presenting to the ED after falling and sustaining a left femoral neck  fracture.  Left hemiarthroplasty was completed 4/40 without complication.  Postoperatively the patient had significant altered mentation which necessitated one-to-one observation.  Low-dose Quetiapine was initiated with significant resolution in the AMS.  48 hours prior to discharge he did not require the one-to-one supervision. He did exhibit leukocytosis which was resolving slowly.  He also had hypernatremia in the context of dehydration.  The most recent sodium level was 155.  Free water intake was encouraged.  Past medical and surgical history: Essential hypertension, CKD 3, and neurocognitive deficit.  Social history: Nondrinker currently; never smoked  Family history: None on file   Review of systems:  Could not be completed due to nonverbal state except for unintelligible monosyllables which sound like "ah" repeated incessantly.  PT/OT states that the patient is noncommunicative and does not open his eyes.  He did not eat his breakfast.  He has been found on the floor beside the bed twice since admission. He is unable to follow commands. According to the discharge summary his cognitive dysfunction has been progressively worse over the last 6 months  and has been associated with depression.  Physical exam:  Pertinent or positive findings: He appears chronically ill and malnourished.  Meal tray is untouched.  He resists  opening his eyes.  Teeth are coated.  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  Breath sounds are decreased.  Heart is irregular and somewhat distant.  Bowel sounds are decreased but the abdomen is soft.  Pedal pulses are decreased.  He lies in the right lateral decubitus position and appears to be able to move all his extremities.  He constantly picks at his gown and diaper at the left hip.  There is extensive ecchymosis over the left hip.  General appearance:  no acute distress, increased work of breathing is present.   Lymphatic: No lymphadenopathy about the head, neck, axilla. Eyes: No conjunctival inflammation or lid edema is present. There is no scleral icterus. Ears:  External ear exam shows no significant lesions or deformities.   Nose:  External nasal examination shows no deformity or inflammation. Nasal mucosa are pink and moist without lesions, exudates Neck:  No thyromegaly, masses, tenderness noted.    Heart:  No gallop, murmur, click, rub.  Lungs:  without wheezes, rhonchi, rales, rubs. Abdomen:  Abdomen is soft and nontender with no organomegaly, hernias, masses. GU: Deferred  Extremities:  No cyanosis, clubbing, edema. Neurologic exam: Balance, Rhomberg, finger to nose testing could not be completed due to clinical state Skin: Warm & dry w/o tenting. No significant lesions or rash.  See clinical summary under each active problem in the Problem List with associated updated therapeutic plan

## 2018-11-18 NOTE — ED Triage Notes (Signed)
Pt BIB EMS from [place] d/t possible code sepsis. Pt. Had recent fall. On 11/09/2018 pt. Had surgery to repair left femur fracture and hip. Pt. Stopped talking/eating yesterday.  Pt oriented to person. Pt. incomprehensible speech.  Information gathered from wife via phone.

## 2018-11-18 NOTE — Assessment & Plan Note (Signed)
11/18/2018 the patient is nonverbal and cannot follow commands.  Psych consult can be pursued; but this patient would be best served by transfer to a memory care unit.

## 2018-11-18 NOTE — Patient Instructions (Signed)
See assessment and plan under each diagnosis in the problem list and acutely for this visit 

## 2018-11-18 NOTE — Assessment & Plan Note (Signed)
Exacerbated by poor oral intake and dehydration with associated hypernatremia.

## 2018-11-18 NOTE — Assessment & Plan Note (Addendum)
Hypernatremia appeared to be progressing prior to discharge.  Most likely related to poor oral fluid intake. Diabetes insipidus unlikely. One on one care ordered especially @ meals. If fluid intake insufficient; IV D5W

## 2018-11-18 NOTE — Assessment & Plan Note (Signed)
With his severe dementia he is unable to participate in PT/OT.  He has been found on the floor on at least 2 occasions.  Tragically it is only a matter time until he re fractures the femur or sustains other musculoskeletal injury.

## 2018-11-19 ENCOUNTER — Inpatient Hospital Stay (HOSPITAL_COMMUNITY): Payer: Medicare Other

## 2018-11-19 ENCOUNTER — Encounter (HOSPITAL_COMMUNITY): Payer: Self-pay | Admitting: Internal Medicine

## 2018-11-19 ENCOUNTER — Emergency Department (HOSPITAL_COMMUNITY): Payer: Medicare Other

## 2018-11-19 DIAGNOSIS — N186 End stage renal disease: Secondary | ICD-10-CM | POA: Diagnosis not present

## 2018-11-19 DIAGNOSIS — E870 Hyperosmolality and hypernatremia: Secondary | ICD-10-CM | POA: Diagnosis present

## 2018-11-19 DIAGNOSIS — G9340 Encephalopathy, unspecified: Secondary | ICD-10-CM

## 2018-11-19 DIAGNOSIS — N281 Cyst of kidney, acquired: Secondary | ICD-10-CM | POA: Diagnosis not present

## 2018-11-19 DIAGNOSIS — Z6822 Body mass index (BMI) 22.0-22.9, adult: Secondary | ICD-10-CM | POA: Diagnosis not present

## 2018-11-19 DIAGNOSIS — I10 Essential (primary) hypertension: Secondary | ICD-10-CM

## 2018-11-19 DIAGNOSIS — E860 Dehydration: Secondary | ICD-10-CM | POA: Diagnosis present

## 2018-11-19 DIAGNOSIS — G9341 Metabolic encephalopathy: Secondary | ICD-10-CM | POA: Diagnosis present

## 2018-11-19 DIAGNOSIS — Z96642 Presence of left artificial hip joint: Secondary | ICD-10-CM | POA: Diagnosis present

## 2018-11-19 DIAGNOSIS — R4020 Unspecified coma: Secondary | ICD-10-CM | POA: Diagnosis not present

## 2018-11-19 DIAGNOSIS — Z7901 Long term (current) use of anticoagulants: Secondary | ICD-10-CM | POA: Diagnosis not present

## 2018-11-19 DIAGNOSIS — A419 Sepsis, unspecified organism: Secondary | ICD-10-CM | POA: Diagnosis not present

## 2018-11-19 DIAGNOSIS — G309 Alzheimer's disease, unspecified: Secondary | ICD-10-CM | POA: Diagnosis not present

## 2018-11-19 DIAGNOSIS — Z79899 Other long term (current) drug therapy: Secondary | ICD-10-CM | POA: Diagnosis not present

## 2018-11-19 DIAGNOSIS — I959 Hypotension, unspecified: Secondary | ICD-10-CM | POA: Diagnosis present

## 2018-11-19 DIAGNOSIS — E861 Hypovolemia: Secondary | ICD-10-CM | POA: Diagnosis present

## 2018-11-19 DIAGNOSIS — N183 Chronic kidney disease, stage 3 (moderate): Secondary | ICD-10-CM | POA: Diagnosis not present

## 2018-11-19 DIAGNOSIS — S72002D Fracture of unspecified part of neck of left femur, subsequent encounter for closed fracture with routine healing: Secondary | ICD-10-CM | POA: Diagnosis not present

## 2018-11-19 DIAGNOSIS — N179 Acute kidney failure, unspecified: Secondary | ICD-10-CM | POA: Diagnosis not present

## 2018-11-19 DIAGNOSIS — I129 Hypertensive chronic kidney disease with stage 1 through stage 4 chronic kidney disease, or unspecified chronic kidney disease: Secondary | ICD-10-CM | POA: Diagnosis present

## 2018-11-19 DIAGNOSIS — R0902 Hypoxemia: Secondary | ICD-10-CM | POA: Diagnosis not present

## 2018-11-19 DIAGNOSIS — D649 Anemia, unspecified: Secondary | ICD-10-CM | POA: Diagnosis present

## 2018-11-19 DIAGNOSIS — Z79891 Long term (current) use of opiate analgesic: Secondary | ICD-10-CM | POA: Diagnosis not present

## 2018-11-19 DIAGNOSIS — L89619 Pressure ulcer of right heel, unspecified stage: Secondary | ICD-10-CM | POA: Diagnosis not present

## 2018-11-19 DIAGNOSIS — N170 Acute kidney failure with tubular necrosis: Secondary | ICD-10-CM | POA: Diagnosis not present

## 2018-11-19 DIAGNOSIS — Z66 Do not resuscitate: Secondary | ICD-10-CM | POA: Diagnosis present

## 2018-11-19 DIAGNOSIS — N3289 Other specified disorders of bladder: Secondary | ICD-10-CM | POA: Diagnosis present

## 2018-11-19 DIAGNOSIS — Z7401 Bed confinement status: Secondary | ICD-10-CM | POA: Diagnosis not present

## 2018-11-19 DIAGNOSIS — F0280 Dementia in other diseases classified elsewhere without behavioral disturbance: Secondary | ICD-10-CM | POA: Diagnosis present

## 2018-11-19 DIAGNOSIS — R319 Hematuria, unspecified: Secondary | ICD-10-CM | POA: Diagnosis present

## 2018-11-19 DIAGNOSIS — Z741 Need for assistance with personal care: Secondary | ICD-10-CM | POA: Diagnosis not present

## 2018-11-19 DIAGNOSIS — Z515 Encounter for palliative care: Secondary | ICD-10-CM | POA: Diagnosis present

## 2018-11-19 DIAGNOSIS — Z1159 Encounter for screening for other viral diseases: Secondary | ICD-10-CM | POA: Diagnosis not present

## 2018-11-19 DIAGNOSIS — I120 Hypertensive chronic kidney disease with stage 5 chronic kidney disease or end stage renal disease: Secondary | ICD-10-CM | POA: Diagnosis not present

## 2018-11-19 DIAGNOSIS — Z7982 Long term (current) use of aspirin: Secondary | ICD-10-CM | POA: Diagnosis not present

## 2018-11-19 DIAGNOSIS — M2550 Pain in unspecified joint: Secondary | ICD-10-CM | POA: Diagnosis not present

## 2018-11-19 LAB — CBC WITH DIFFERENTIAL/PLATELET
Abs Immature Granulocytes: 0.38 10*3/uL — ABNORMAL HIGH (ref 0.00–0.07)
Basophils Absolute: 0.1 10*3/uL (ref 0.0–0.1)
Basophils Relative: 0 %
Eosinophils Absolute: 0 10*3/uL (ref 0.0–0.5)
Eosinophils Relative: 0 %
HCT: 39.6 % (ref 39.0–52.0)
Hemoglobin: 12.2 g/dL — ABNORMAL LOW (ref 13.0–17.0)
Immature Granulocytes: 1 %
Lymphocytes Relative: 3 %
Lymphs Abs: 0.8 10*3/uL (ref 0.7–4.0)
MCH: 29.8 pg (ref 26.0–34.0)
MCHC: 30.8 g/dL (ref 30.0–36.0)
MCV: 96.6 fL (ref 80.0–100.0)
Monocytes Absolute: 1 10*3/uL (ref 0.1–1.0)
Monocytes Relative: 3 %
Neutro Abs: 28 10*3/uL — ABNORMAL HIGH (ref 1.7–7.7)
Neutrophils Relative %: 93 %
Platelets: 318 10*3/uL (ref 150–400)
RBC: 4.1 MIL/uL — ABNORMAL LOW (ref 4.22–5.81)
RDW: 13.9 % (ref 11.5–15.5)
WBC: 30.4 10*3/uL — ABNORMAL HIGH (ref 4.0–10.5)
nRBC: 0.1 % (ref 0.0–0.2)

## 2018-11-19 LAB — BASIC METABOLIC PANEL
Anion gap: 14 (ref 5–15)
Anion gap: 11 (ref 5–15)
Anion gap: 11 (ref 5–15)
BUN: 122 mg/dL — ABNORMAL HIGH (ref 8–23)
BUN: 122 mg/dL — ABNORMAL HIGH (ref 8–23)
BUN: 118 mg/dL — ABNORMAL HIGH (ref 8–23)
CO2: 21 mmol/L — ABNORMAL LOW (ref 22–32)
CO2: 21 mmol/L — ABNORMAL LOW (ref 22–32)
CO2: 20 mmol/L — ABNORMAL LOW (ref 22–32)
Calcium: 7.9 mg/dL — ABNORMAL LOW (ref 8.9–10.3)
Calcium: 7.8 mg/dL — ABNORMAL LOW (ref 8.9–10.3)
Calcium: 8 mg/dL — ABNORMAL LOW (ref 8.9–10.3)
Chloride: 129 mmol/L — ABNORMAL HIGH (ref 98–111)
Chloride: 129 mmol/L — ABNORMAL HIGH (ref 98–111)
Chloride: 129 mmol/L — ABNORMAL HIGH (ref 98–111)
Creatinine, Ser: 4.98 mg/dL — ABNORMAL HIGH (ref 0.61–1.24)
Creatinine, Ser: 4.49 mg/dL — ABNORMAL HIGH (ref 0.61–1.24)
Creatinine, Ser: 4.46 mg/dL — ABNORMAL HIGH (ref 0.61–1.24)
GFR calc Af Amer: 11 mL/min — ABNORMAL LOW (ref >60)
GFR calc Af Amer: 13 mL/min — ABNORMAL LOW (ref >60)
GFR calc Af Amer: 13 mL/min — ABNORMAL LOW (ref >60)
GFR calc non Af Amer: 10 mL/min — ABNORMAL LOW (ref >60)
GFR calc non Af Amer: 11 mL/min — ABNORMAL LOW (ref >60)
GFR calc non Af Amer: 11 mL/min — ABNORMAL LOW (ref >60)
Glucose, Bld: 180 mg/dL — ABNORMAL HIGH (ref 70–99)
Glucose, Bld: 173 mg/dL — ABNORMAL HIGH (ref 70–99)
Glucose, Bld: 142 mg/dL — ABNORMAL HIGH (ref 70–99)
Potassium: 5 mmol/L (ref 3.5–5.1)
Potassium: 3.7 mmol/L (ref 3.5–5.1)
Potassium: 3.7 mmol/L (ref 3.5–5.1)
Sodium: 164 mmol/L (ref 135–145)
Sodium: 161 mmol/L (ref 135–145)
Sodium: 160 mmol/L — ABNORMAL HIGH (ref 135–145)

## 2018-11-19 LAB — URINALYSIS, ROUTINE W REFLEX MICROSCOPIC
Bilirubin Urine: NEGATIVE
Glucose, UA: NEGATIVE mg/dL
Ketones, ur: NEGATIVE mg/dL
Leukocytes,Ua: NEGATIVE
Nitrite: NEGATIVE
Protein, ur: NEGATIVE mg/dL
RBC / HPF: >50 RBC/hpf — ABNORMAL HIGH (ref 0–5)
Specific Gravity, Urine: 1.016 (ref 1.005–1.030)
pH: 5 (ref 5.0–8.0)

## 2018-11-19 LAB — HEMOGLOBIN A1C
Hgb A1c MFr Bld: 5.1 % (ref 4.8–5.6)
Mean Plasma Glucose: 99.67 mg/dL

## 2018-11-19 LAB — CBC
HCT: 35.7 % — ABNORMAL LOW (ref 39.0–52.0)
Hemoglobin: 10.9 g/dL — ABNORMAL LOW (ref 13.0–17.0)
MCH: 29.9 pg (ref 26.0–34.0)
MCHC: 30.5 g/dL (ref 30.0–36.0)
MCV: 98.1 fL (ref 80.0–100.0)
Platelets: 259 10*3/uL (ref 150–400)
RBC: 3.64 MIL/uL — ABNORMAL LOW (ref 4.22–5.81)
RDW: 13.7 % (ref 11.5–15.5)
WBC: 29.7 10*3/uL — ABNORMAL HIGH (ref 4.0–10.5)
nRBC: 0 % (ref 0.0–0.2)

## 2018-11-19 LAB — OSMOLALITY, URINE: Osmolality, Ur: 490 mOsm/kg (ref 300–900)

## 2018-11-19 LAB — GLUCOSE, CAPILLARY
Glucose-Capillary: 114 mg/dL — ABNORMAL HIGH (ref 70–99)
Glucose-Capillary: 152 mg/dL — ABNORMAL HIGH (ref 70–99)

## 2018-11-19 LAB — COMPREHENSIVE METABOLIC PANEL
ALT: 30 U/L (ref 0–44)
AST: 24 U/L (ref 15–41)
Albumin: 3 g/dL — ABNORMAL LOW (ref 3.5–5.0)
Alkaline Phosphatase: 57 U/L (ref 38–126)
Anion gap: 16 — ABNORMAL HIGH (ref 5–15)
BUN: 125 mg/dL — ABNORMAL HIGH (ref 8–23)
CO2: 22 mmol/L (ref 22–32)
Calcium: 8.6 mg/dL — ABNORMAL LOW (ref 8.9–10.3)
Chloride: 124 mmol/L — ABNORMAL HIGH (ref 98–111)
Creatinine, Ser: 4.79 mg/dL — ABNORMAL HIGH (ref 0.61–1.24)
GFR calc Af Amer: 12 mL/min — ABNORMAL LOW (ref >60)
GFR calc non Af Amer: 10 mL/min — ABNORMAL LOW (ref >60)
Glucose, Bld: 150 mg/dL — ABNORMAL HIGH (ref 70–99)
Potassium: 3.9 mmol/L (ref 3.5–5.1)
Sodium: 162 mmol/L (ref 135–145)
Total Bilirubin: 1.4 mg/dL — ABNORMAL HIGH (ref 0.3–1.2)
Total Protein: 5.6 g/dL — ABNORMAL LOW (ref 6.5–8.1)

## 2018-11-19 LAB — CREATININE, URINE, RANDOM: Creatinine, Urine: 154.61 mg/dL

## 2018-11-19 LAB — CBG MONITORING, ED
Glucose-Capillary: 154 mg/dL — ABNORMAL HIGH (ref 70–99)
Glucose-Capillary: 125 mg/dL — ABNORMAL HIGH (ref 70–99)

## 2018-11-19 LAB — SODIUM, URINE, RANDOM: Sodium, Ur: 11 mmol/L

## 2018-11-19 LAB — LACTIC ACID, PLASMA
Lactic Acid, Venous: 2.1 mmol/L (ref 0.5–1.9)
Lactic Acid, Venous: 1.4 mmol/L (ref 0.5–1.9)

## 2018-11-19 LAB — SARS CORONAVIRUS 2 BY RT PCR (HOSPITAL ORDER, PERFORMED IN ~~LOC~~ HOSPITAL LAB): SARS Coronavirus 2: NEGATIVE

## 2018-11-19 LAB — PROTIME-INR
INR: 1.3 — ABNORMAL HIGH (ref 0.8–1.2)
Prothrombin Time: 15.8 seconds — ABNORMAL HIGH (ref 11.4–15.2)

## 2018-11-19 MED ORDER — SODIUM CHLORIDE 0.9% FLUSH
3.0000 mL | Freq: Once | INTRAVENOUS | Status: AC
  Administered 2018-11-19: 02:00:00 3 mL via INTRAVENOUS

## 2018-11-19 MED ORDER — ONDANSETRON HCL 4 MG/2ML IJ SOLN: 4.0000 mg | Freq: Four times a day (QID) | INTRAMUSCULAR | Status: DC | PRN

## 2018-11-19 MED ORDER — INSULIN ASPART 100 UNIT/ML ~~LOC~~ SOLN
0.0000 [IU] | SUBCUTANEOUS | Status: DC
  Administered 2018-11-19 (×2): 2 [IU] via SUBCUTANEOUS
  Administered 2018-11-20 (×2): 1 [IU] via SUBCUTANEOUS
  Administered 2018-11-20: 2 [IU] via SUBCUTANEOUS
  Administered 2018-11-21: 1 [IU] via SUBCUTANEOUS
  Administered 2018-11-21: 2 [IU] via SUBCUTANEOUS

## 2018-11-19 MED ORDER — SODIUM CHLORIDE 0.9 % IV BOLUS
1000.0000 mL | Freq: Once | INTRAVENOUS | Status: AC
  Administered 2018-11-19: 1000 mL via INTRAVENOUS

## 2018-11-19 MED ORDER — LORAZEPAM 2 MG/ML IJ SOLN
0.2500 mg | Freq: Once | INTRAMUSCULAR | Status: AC
  Administered 2018-11-19: 0.25 mg via INTRAVENOUS
  Filled 2018-11-19: qty 1

## 2018-11-19 MED ORDER — SODIUM CHLORIDE 0.9 % IV BOLUS (SEPSIS)
250.0000 mL | Freq: Once | INTRAVENOUS | Status: AC
  Administered 2018-11-19: 250 mL via INTRAVENOUS

## 2018-11-19 MED ORDER — METRONIDAZOLE IN NACL 5-0.79 MG/ML-% IV SOLN
500.0000 mg | Freq: Once | INTRAVENOUS | Status: AC
  Administered 2018-11-19: 500 mg via INTRAVENOUS
  Filled 2018-11-19: qty 100

## 2018-11-19 MED ORDER — SODIUM CHLORIDE 0.9 % IV SOLN
2.0000 g | Freq: Once | INTRAVENOUS | Status: AC
  Administered 2018-11-19: 2 g via INTRAVENOUS
  Filled 2018-11-19: qty 2

## 2018-11-19 MED ORDER — VANCOMYCIN HCL IN DEXTROSE 1-5 GM/200ML-% IV SOLN
1000.0000 mg | Freq: Once | INTRAVENOUS | Status: AC
  Administered 2018-11-19: 1000 mg via INTRAVENOUS
  Filled 2018-11-19: qty 200

## 2018-11-19 MED ORDER — ONDANSETRON HCL 4 MG PO TABS: 4.0000 mg | ORAL_TABLET | Freq: Four times a day (QID) | ORAL | Status: DC | PRN

## 2018-11-19 MED ORDER — SODIUM CHLORIDE 0.9 % IV BOLUS (SEPSIS)
1000.0000 mL | Freq: Once | INTRAVENOUS | Status: AC
  Administered 2018-11-19: 1000 mL via INTRAVENOUS

## 2018-11-19 MED ORDER — VANCOMYCIN HCL 500 MG IV SOLR
500.0000 mg | INTRAVENOUS | Status: DC
  Filled 2018-11-19: qty 500

## 2018-11-19 MED ORDER — ENOXAPARIN SODIUM 40 MG/0.4ML ~~LOC~~ SOLN
40.0000 mg | SUBCUTANEOUS | Status: DC
  Filled 2018-11-19: qty 0.4

## 2018-11-19 MED ORDER — DEXTROSE 5 % IV SOLN
INTRAVENOUS | Status: DC
  Administered 2018-11-19: 06:00:00 via INTRAVENOUS

## 2018-11-19 MED ORDER — CHLORHEXIDINE GLUCONATE 0.12 % MT SOLN
15.0000 mL | Freq: Two times a day (BID) | OROMUCOSAL | Status: DC
  Administered 2018-11-19 – 2018-11-23 (×8): 15 mL via OROMUCOSAL
  Filled 2018-11-19 (×7): qty 15

## 2018-11-19 MED ORDER — ACETAMINOPHEN 650 MG RE SUPP: 650.0000 mg | Freq: Four times a day (QID) | RECTAL | Status: DC | PRN

## 2018-11-19 MED ORDER — SODIUM CHLORIDE 0.9 % IV SOLN
1.0000 g | INTRAVENOUS | Status: DC
  Administered 2018-11-19: 1 g via INTRAVENOUS
  Filled 2018-11-19 (×3): qty 1

## 2018-11-19 MED ORDER — ORAL CARE MOUTH RINSE
15.0000 mL | Freq: Two times a day (BID) | OROMUCOSAL | Status: DC
  Administered 2018-11-19 – 2018-11-22 (×6): 15 mL via OROMUCOSAL

## 2018-11-19 MED ORDER — ACETAMINOPHEN 325 MG PO TABS: 650.0000 mg | ORAL_TABLET | Freq: Four times a day (QID) | ORAL | Status: DC | PRN

## 2018-11-19 MED ORDER — SODIUM CHLORIDE 0.9 % IV BOLUS (SEPSIS): 1000.0000 mL | Freq: Once | INTRAVENOUS | Status: DC

## 2018-11-19 MED ORDER — DEXTROSE-NACL 5-0.45 % IV SOLN
INTRAVENOUS | Status: DC
  Administered 2018-11-19 – 2018-11-20 (×2): via INTRAVENOUS
  Administered 2018-11-20: 1000 mL via INTRAVENOUS

## 2018-11-19 MED ORDER — DEXTROSE 5 % IV SOLN
INTRAVENOUS | Status: DC
  Administered 2018-11-19: 19:00:00 via INTRAVENOUS

## 2018-11-19 NOTE — ED Notes (Signed)
Wife in the room and concerned about agitation level.  She is able to stay with him until placement and has questions about SNF post discharge, d/t increased work of care.  Social Service consult will be placed.

## 2018-11-19 NOTE — ED Provider Notes (Signed)
 [facility] EMERGENCY DEPARTMENT Provider Note   CSN: [id number] Arrival date & time: 11/18/18  2348    History   Chief Complaint Chief Complaint  Patient presents with   Altered Mental Status    HPI ART LEVAN is a 85 y.o. male.     HPI  This is an 85 year old male with a history of chronic kidney disease, hypertension, recent hip fracture with repair and discharged to rehab who presents from rehab with altered mental status.  Patient does not provide any history.  History taken from nursing home report and speaking with wife.  Wife reports cognitive decline over the last several months but patient is normally communicative.  It appears during his recent hospitalization he was significantly altered and required a sitter but improved with quetiapine.  Per report, patient stopped eating and drinking yesterday.  He is oriented only to himself.  Level 5 caveat for altered mental status  History reviewed. No pertinent past medical history.  Patient Active Problem List   Diagnosis Date Noted   CKD (chronic kidney disease) stage 3, GFR 30-59 ml/min (HCC) 11/18/2018   Hypernatremia 11/18/2018   Essential hypertension 11/08/2018   Hip fracture, unspecified laterality, closed, initial encounter ([place]) 11/08/2018   Cognitive decline 11/08/2018   Hip fracture ([place]) 11/08/2018    Past Surgical History:  Procedure Laterality Date   HIP ARTHROPLASTY Left 11/09/2018   Procedure: ARTHROPLASTY BIPOLAR HIP (HEMIARTHROPLASTY);  Surgeon: Hiram Gash, MD;  Location: [facility];  Service: Orthopedics;  Laterality: Left;        Home Medications    Prior to Admission medications   Medication Sig Start Date End Date Taking? Authorizing Provider  acetaminophen (TYLENOL) 500 MG tablet Take 2 tablets (1,000 mg total) by mouth every 8 (eight) hours for 14 days. 11/10/18  Yes Hiram Gash, MD  aspirin EC 81 MG tablet Take 81 mg by mouth daily.   Yes  [provider]  docusate (COLACE) 50 MG/5ML liquid Take 10 mLs (100 mg total) by mouth 2 (two) times daily. 11/17/18  Yes Dana Allan I, MD  enoxaparin (LOVENOX) 40 MG/0.4ML injection Inject 0.4 mLs (40 mg total) into the skin daily. 11/10/18 11/10/19 Yes Hiram Gash, MD  feeding supplement, ENSURE ENLIVE, (ENSURE ENLIVE) LIQD Take 237 mLs by mouth 2 (two) times daily between meals. 11/17/18  Yes Bonnell Public, MD  Multiple Vitamin (MULTIVITAMIN WITH MINERALS) TABS tablet Take 1 tablet by mouth daily. 11/18/18  Yes Bonnell Public, MD  ondansetron (ZOFRAN) 4 MG tablet Take 4 mg by mouth every 8 (eight) hours as needed for nausea or vomiting. 11/17/18  Yes [provider]  oxyCODONE (OXY IR/ROXICODONE) 5 MG immediate release tablet Take 5 mg by mouth every 6 (six) hours as needed for severe pain.   Yes [provider]  QUEtiapine (SEROQUEL) 25 MG tablet Take 0.5 tablets (12.5 mg total) by mouth at bedtime for 7 days. 11/17/18  Yes Bonnell Public, MD    Family History History reviewed. No pertinent family history.  Social History Social History   Tobacco Use   Smoking status: Never Smoker   Smokeless tobacco: Never Used  Substance Use Topics   Alcohol use: Not Currently   Drug use: Never     Allergies   Ibuprofen   Review of Systems Review of Systems  Unable to perform ROS: Mental status change     Physical Exam Updated Vital Signs BP 111/72    Pulse [name] Kitchen)  103    Temp 98.6 F (37 C) (Rectal)    Resp (!) 23    Ht 1.702 m (5\' 7" )    Wt 68 kg    SpO2 99%    BMI 23.49 kg/m   Physical Exam Vitals signs and nursing note reviewed.  Constitutional:      Appearance: He is well-developed.     Comments: Chronically ill-appearing, incomprehensible speech  HENT:     Head: Normocephalic and atraumatic.     Mouth/Throat:     Mouth: Mucous membranes are dry.  Eyes:     Pupils: Pupils are equal, round, and reactive to light.    Neck:     Musculoskeletal: Neck supple.  Cardiovascular:     Rate and Rhythm: Regular rhythm. Tachycardia present.     Heart sounds: Normal heart sounds. No murmur.  Pulmonary:     Effort: Pulmonary effort is normal. No respiratory distress.     Breath sounds: Normal breath sounds. No wheezing.  Abdominal:     General: Bowel sounds are normal.     Palpations: Abdomen is soft.     Tenderness: There is no abdominal tenderness. There is no rebound.  Musculoskeletal:        General: No swelling.  Skin:    General: Skin is warm and dry.     Comments: Incision clean dry and intact  Neurological:     Mental Status: He is alert.     Comments: Alert, only oriented to self, does not follow commands, moves all 4 extremities equally  Psychiatric:     Comments: Unable to assess      ED Treatments / Results  Labs (all labs ordered are listed, but only abnormal results are displayed) Labs Reviewed  COMPREHENSIVE METABOLIC PANEL - Abnormal; Notable for the following components:      Result Value   Sodium 162 (*)    Chloride 124 (*)    Glucose, Bld 150 (*)    BUN 125 (*)    Creatinine, Ser 4.79 (*)    Calcium 8.6 (*)    Total Protein 5.6 (*)    Albumin 3.0 (*)    Total Bilirubin 1.4 (*)    GFR calc non Af Amer 10 (*)    GFR calc Af Amer 12 (*)    Anion gap 16 (*)    All other components within normal limits  LACTIC ACID, PLASMA - Abnormal; Notable for the following components:   Lactic Acid, Venous 2.1 (*)    All other components within normal limits  CBC WITH DIFFERENTIAL/PLATELET - Abnormal; Notable for the following components:   WBC 30.4 (*)    RBC 4.10 (*)    Hemoglobin 12.2 (*)    Neutro Abs 28.0 (*)    Abs Immature Granulocytes 0.38 (*)    All other components within normal limits  PROTIME-INR - Abnormal; Notable for the following components:   Prothrombin Time 15.8 (*)    INR 1.3 (*)    All other components within normal limits  URINALYSIS, ROUTINE W REFLEX  MICROSCOPIC - Abnormal; Notable for the following components:   APPearance CLOUDY (*)    Hgb urine dipstick LARGE (*)    RBC / HPF >50 (*)    Bacteria, UA RARE (*)    All other components within normal limits  CULTURE, BLOOD (ROUTINE X 2)  CULTURE, BLOOD (ROUTINE X 2)  SARS CORONAVIRUS 2 (HOSPITAL ORDER, [facility])  LACTIC ACID, PLASMA  EKG None  Radiology Dg Chest 2 View  Result Date: 11/19/2018 CLINICAL DATA:  Sepsis EXAM: CHEST - 2 VIEW COMPARISON:  November 15, 2018 FINDINGS: There is no large infiltrate. The heart size is stable. There is no pneumothorax. No acute osseous abnormality. The lungs are essentially clear. The previously noted left basilar airspace opacity is no longer well visualized. IMPRESSION: No active cardiopulmonary disease. Electronically Signed   By: Constance Holster M.D.   On: 11/19/2018 01:30    Procedures Procedures (including critical care time)  CRITICAL CARE Performed by: Merryl Hacker   Total critical care time: 45 minutes  Critical care time was exclusive of separately billable procedures and treating other patients.  Critical care was necessary to treat or prevent imminent or life-threatening deterioration.  Critical care was time spent personally by me on the following activities: development of treatment plan with patient and/or surrogate as well as nursing, discussions with consultants, evaluation of patient's response to treatment, examination of patient, obtaining history from patient or surrogate, ordering and performing treatments and interventions, ordering and review of laboratory studies, ordering and review of radiographic studies, pulse oximetry and re-evaluation of patient's condition.   Medications Ordered in ED Medications  sodium chloride flush (NS) 0.9 % injection 3 mL (has no administration in time range)  sodium chloride 0.9 % bolus 1,000 mL (has no administration in time range)    And    sodium chloride 0.9 % bolus 1,000 mL (has no administration in time range)    And  sodium chloride 0.9 % bolus 250 mL (has no administration in time range)  ceFEPIme (MAXIPIME) 2 g in sodium chloride 0.9 % 100 mL IVPB (has no administration in time range)  metroNIDAZOLE (FLAGYL) IVPB 500 mg (has no administration in time range)  vancomycin (VANCOCIN) IVPB 1000 mg/200 mL premix (has no administration in time range)  sodium chloride 0.9 % bolus 1,000 mL (1,000 mLs Intravenous New Bag/Given 11/19/18 0204)     Initial Impression / Assessment and Plan / ED Course  I have reviewed the triage vital signs and the nursing notes.  Pertinent labs & imaging results that were available during my care of the patient were reviewed by me and considered in my medical decision making (see chart for details).        Presents with altered mental status.  Recent hip surgery and in rehab.  Has not been eating or drinking per report in the last 24 hours.  He is altered but nonfocal on exam.  Vital signs notable for mild tachycardia at 103.  He is afebrile.  Work-up initiated.  He does have a slightly elevated lactate at 2 but his blood pressure is stable.  Lab work notable for a sodium of 162.  Creatinine acutely elevated at 4.79.  Patient with white count of 30.4.  No obvious source of infection.  Chest x-ray and urinalysis are reassuring.  However, given clinical scenario, patient was treated with broad-spectrum antibiotics for possible sepsis.  Clinically he appears very dry and was given 30 cc/kg fluid as well.  He does not appear in septic shock.  I have discussed with his wife and confirmed that he is DO NOT RESUSCITATE.  [name] Kitchen  Final Clinical Impressions(s) / ED Diagnoses   Final diagnoses:  Metabolic encephalopathy  AKI (acute kidney injury) ([place])  Hypernatremia    ED Discharge Orders    None       Dina Rich, Barbette Hair, MD 11/19/18 [id number]

## 2018-11-19 NOTE — ED Notes (Signed)
Spoke with MD to update on patient condition and remaining in the ED until bed assigned.  Changes to IV rate and additional labs ordered.

## 2018-11-19 NOTE — Progress Notes (Signed)
Attempted to call and get report, no answer from ED nurse.

## 2018-11-19 NOTE — ED Notes (Signed)
Nurse [facility] RN.STATED SHE WILL GET THE BLOOD.

## 2018-11-19 NOTE — ED Notes (Signed)
Pt remains confused with sitter in place.  Moves all extremities with slight agitation.  Mittens in place to prevent pulling lines, disrupting skin.

## 2018-11-19 NOTE — Progress Notes (Addendum)
PROGRESS NOTE    Anthony Hamilton  IEP:[id number] DOB: 17-May-1928 DOA: 11/18/2018 PCP: Seward Carol, MD   Brief Narrative: Anthony Hamilton is a 85 y.o. male with a history of dementia, hip fracture. Patient presented secondary to confusion and found to have hypernatremia, hematuria, leukocytosis. Started on D5 water in addition to empiric antibiotics.   Assessment & Plan:   Principal Problem:   ARF (acute renal failure) (HCC) Active Problems:   Essential hypertension   Hypernatremia   Acute encephalopathy   Acute metabolic encephalopathy Likely secondary to hypernatremia in addition to uremia. Question of associated infection. Still significantly encephalopathy -Treat below problems  Hypernatremia Sodium of 162 on admission. Started on D5W. -Continue D5w and increase to 125 ml/hr  Acute kidney injury on CKD stage III Baseline creatinine of 1.7. Creatinine of 4.79 on admission with peak of 4.98. Decreased UOP. Likely secondary to severe dehydration. Associated elevated BUN with likely uremia. BUN still elevated with IV fluids -Will consult nephrology -Renal ultrasound -Urine sodium, creatinine, osmolality  Leukocytosis Significant. Met sepsis criteria on admission. Baseline WBC of 13.4 k from 5 days prior. WBC of 30.4 k on admission. Afebrile. Blood cultures obtained. CXR clear, urinalysis does not suggest infection. -Continue antibiotics for now -Blood cultures pending  Hematuria CT renal study negative for nephrolithiasis. Urinalysis not suggestive of infection other than significant hematuria. -CBC daily -Watch UOP  History of left hip fracture Patient is on Lovenox as an outpatient. Surgery was on 11/09/2018. Recommendation at that time for 30 days of anticoagulation. -Continue to hold Lovenox for now pending resolution of hematuria and stabilization of hemoglobin  Dementia Patient is on Seroquel.   DVT prophylaxis: SCDs Code Status:   Code Status:  DNR Family Communication: Wife on telephone Disposition Plan: Discharge back to SNF when mental status back to baseline   Consultants:   Nephrology  Procedures:   None  Antimicrobials:  Cefepime (6/24  Vancomycin (6/24  Metronidazole (6/24   Subjective: Unable to communicate secondary to encephalopathy  Objective: Vitals:   11/19/18 0615 11/19/18 0645 11/19/18 0700 11/19/18 0715  BP: 116/61 (!) 126/57 (!) 141/61 126/62  Pulse: 97     Resp: (!) 24 16 (!) 21 14  Temp:      TempSrc:      SpO2: 96%     Weight:      Height:       No intake or output data in the 24 hours ending 11/19/18 0810 Filed Weights   11/18/18 2353  Weight: 68 kg    Examination:  General exam: Appears calm and comfortable Respiratory system: Clear to auscultation. Respiratory effort normal. Cardiovascular system: S1 & S2 heard, RRR. No murmurs, rubs, gallops or clicks. Gastrointestinal system: Abdomen is nondistended, soft and nontender. No organomegaly or masses felt. Normal bowel sounds heard. Central nervous system: Alert and oriented disoriented. Responds to tactile stimulus. Moves all extremities spontaneously.  Extremities: No edema. No calf tenderness Skin: No cyanosis. No rashes Psychiatry: Judgement and insight appear impaired.    Data Reviewed: I have personally reviewed following labs and imaging studies  CBC: Recent Labs  Lab 11/14/18 1007 11/16/18 0445 11/19/18 0007  WBC 15.5* 13.4* 30.4*  NEUTROABS 13.4* 11.1* 28.0*  HGB 12.2* 11.3* 12.2*  HCT 37.8* 35.7* 39.6  MCV 92.6 92.7 96.6  PLT 244 272 660   Basic Metabolic Panel: Recent Labs  Lab 11/14/18 1007 11/16/18 0445 11/19/18 0007  NA 151* 155* 162*  K 3.6 3.6 3.9  CL 116*  120* 124*  CO2 '23 27 22  ' GLUCOSE 163* 164* 150*  BUN 59* 59* 125*  CREATININE 1.76* 1.86* 4.79*  CALCIUM 9.2 8.8* 8.6*  MG 2.4 2.5*  --   PHOS 3.1 3.6  --    GFR: Estimated Creatinine Clearance: 10 mL/min (A) (by C-G formula based  on SCr of 4.79 mg/dL (H)). Liver Function Tests: Recent Labs  Lab 11/14/18 1007 11/16/18 0445 11/19/18 0007  AST  --   --  24  ALT  --   --  30  ALKPHOS  --   --  57  BILITOT  --   --  1.4*  PROT  --   --  5.6*  ALBUMIN 3.3* 3.0* 3.0*   No results for input(s): LIPASE, AMYLASE in the last 168 hours. No results for input(s): AMMONIA in the last 168 hours. Coagulation Profile: Recent Labs  Lab 11/19/18 0007  INR 1.3*   Cardiac Enzymes: No results for input(s): CKTOTAL, CKMB, CKMBINDEX, TROPONINI in the last 168 hours. BNP (last 3 results) No results for input(s): PROBNP in the last 8760 hours. HbA1C: No results for input(s): HGBA1C in the last 72 hours. CBG: No results for input(s): GLUCAP in the last 168 hours. Lipid Profile: No results for input(s): CHOL, HDL, LDLCALC, TRIG, CHOLHDL, LDLDIRECT in the last 72 hours. Thyroid Function Tests: No results for input(s): TSH, T4TOTAL, FREET4, T3FREE, THYROIDAB in the last 72 hours. Anemia Panel: No results for input(s): VITAMINB12, FOLATE, FERRITIN, TIBC, IRON, RETICCTPCT in the last 72 hours. Sepsis Labs: Recent Labs  Lab 11/19/18 0007 11/19/18 0239  LATICACIDVEN 2.1* 1.4    Recent Results (from the past 240 hour(s))  SARS Coronavirus 2 (CEPHEID - Performed in [facility] lab), Hosp Order     Status: None   Collection Time: 11/19/18  1:13 AM   Specimen: Nasopharyngeal Swab  Result Value Ref Range Status   SARS Coronavirus 2 NEGATIVE NEGATIVE Final    Comment: (NOTE) If result is NEGATIVE SARS-CoV-2 target nucleic acids are NOT DETECTED. The SARS-CoV-2 RNA is generally detectable in upper and lower  respiratory specimens during the acute phase of infection. The lowest  concentration of SARS-CoV-2 viral copies this assay can detect is 250  copies / mL. A negative result does not preclude SARS-CoV-2 infection  and should not be used as the sole basis for treatment or other  patient management decisions.  A  negative result may occur with  improper specimen collection / handling, submission of specimen other  than nasopharyngeal swab, presence of viral mutation(s) within the  areas targeted by this assay, and inadequate number of viral copies  (<250 copies / mL). A negative result must be combined with clinical  observations, patient history, and epidemiological information. If result is POSITIVE SARS-CoV-2 target nucleic acids are DETECTED. The SARS-CoV-2 RNA is generally detectable in upper and lower  respiratory specimens dur ing the acute phase of infection.  Positive  results are indicative of active infection with SARS-CoV-2.  Clinical  correlation with patient history and other diagnostic information is  necessary to determine patient infection status.  Positive results do  not rule out bacterial infection or co-infection with other viruses. If result is PRESUMPTIVE POSTIVE SARS-CoV-2 nucleic acids MAY BE PRESENT.   A presumptive positive result was obtained on the submitted specimen  and confirmed on repeat testing.  While 2019 novel coronavirus  (SARS-CoV-2) nucleic acids may be present in the submitted sample  additional confirmatory testing may be necessary for  epidemiological  and / or clinical management purposes  to differentiate between  SARS-CoV-2 and other Sarbecovirus currently known to infect humans.  If clinically indicated additional testing with an alternate test  methodology [phone]) is advised. The SARS-CoV-2 RNA is generally  detectable in upper and lower respiratory sp ecimens during the acute  phase of infection. The expected result is Negative. Fact Sheet for Patients:  StrictlyIdeas.no Fact Sheet for Healthcare Providers: BankingDealers.co.za This test is not yet approved or cleared by the [place] FDA and has been authorized for detection and/or diagnosis of SARS-CoV-2 by FDA under an Emergency Use  Authorization (EUA).  This EUA will remain in effect (meaning this test can be used) for the duration of the COVID-19 declaration under Section 564(b)(1) of the Act, 21 U.S.C. section 360bbb-3(b)(1), unless the authorization is terminated or revoked sooner. Performed at [facility], [place] [address] [place], [place] [address]          Radiology Studies: Dg Chest 2 View  Result Date: 11/19/2018 CLINICAL DATA:  Sepsis EXAM: CHEST - 2 VIEW COMPARISON:  November 15, 2018 FINDINGS: There is no large infiltrate. The heart size is stable. There is no pneumothorax. No acute osseous abnormality. The lungs are essentially clear. The previously noted left basilar airspace opacity is no longer well visualized. IMPRESSION: No active cardiopulmonary disease. Electronically Signed   By: Constance Holster M.D.   On: 11/19/2018 01:30   Ct Head Wo Contrast  Result Date: 11/19/2018 CLINICAL DATA:  Altered level of consciousness. EXAM: CT HEAD WITHOUT CONTRAST TECHNIQUE: Contiguous axial images were obtained from the base of the skull through the vertex without intravenous contrast. COMPARISON:  None. FINDINGS: Brain: No evidence of acute infarction, hemorrhage, hydrocephalus, extra-axial collection or mass lesion/mass effect. Volume loss is noted. Chronic microvascular ischemic changes are noted. Evaluation was somewhat limited by motion artifact. Vascular: No hyperdense vessel or unexpected calcification. Skull: Normal. Negative for fracture or focal lesion. Sinuses/Orbits: No acute finding. Other: None. IMPRESSION: 1. No acute intracranial abnormality. 2. Volume loss and chronic microvascular ischemic changes are noted. Electronically Signed   By: Constance Holster M.D.   On: 11/19/2018 03:51   Ct Renal Stone Study  Result Date: 11/19/2018 CLINICAL DATA:  Hematuria with unknown cause EXAM: CT ABDOMEN AND PELVIS WITHOUT CONTRAST TECHNIQUE: Multidetector CT imaging of the abdomen and pelvis was performed  following the standard protocol without IV contrast. COMPARISON:  05/15/2007 FINDINGS: Lower chest:  Mild dependent atelectasis.  Coronary calcification Hepatobiliary: No focal liver abnormality.Cholecystectomy which likely accounts for prominent bile duct dimensions. Pancreas: Generalized fatty atrophy. 2 cm cystic density along the upper pancreas that is new from prior. Spleen: Unremarkable. Adrenals/Urinary Tract: Small bilateral nodules that are stable and attributed adenoma . Multiple bilateral renal cysts including a lobulated posterior interpolar cyst on the right with thin benign-appearing septations that are stable. A large right lower pole cyst measures 11 cm. Accounting for hilar cysts there is no hydronephrosis. Degree of bilateral renal atrophy. Distended bladder with diverticula at the apex. Stomach/Bowel:  No obstruction. No evidence of bowel inflammation. Vascular/Lymphatic: Atherosclerotic calcification. Mesenteric stranding in 2008 has resolved, now with small calcified nodes in this area. Reproductive:Marked thickening of the prostate. Other: Left inguinal hernia containing non thickened and nonobstructed sigmoid colon Musculoskeletal: Fluid and bubble of gas about the left hip arthroplasty correlating with recent placement. No acute osseous finding IMPRESSION: 1. No acute finding or specific explanation for hematuria. 2. Multiple renal cysts with benign mural calcification  on the right. 3. Marked enlargement of the prostate with bladder wall thickening and diverticula attributed to chronic outlet obstruction. 4. 2 cm cystic density nodule along the upper pancreas that is new from 2008. MR follow-up could be obtained if appropriate for life expectancy. 5. Left inguinal hernia containing sigmoid colon. Electronically Signed   By: Monte Fantasia M.D.   On: 11/19/2018 06:09        Scheduled Meds: Continuous Infusions: . ceFEPime (MAXIPIME) IV    . dextrose 100 mL/hr at 11/19/18 0625  .  sodium chloride    . [START ON 11/21/2018] vancomycin       LOS: 0 days     Cordelia Poche, MD [facility] 11/19/2018, 8:10 AM  If 7PM-7AM, please contact night-coverage [email or web address]

## 2018-11-19 NOTE — ED Notes (Signed)
Safety mitten in place at this time. Ativan given for agitation.

## 2018-11-19 NOTE — ED Notes (Signed)
Dr. Hal Hope to order CT renal d/t bleeding.

## 2018-11-19 NOTE — Consult Note (Addendum)
[facility]  INPATIENT CONSULTATION  Reason for Consultation: AKI on CKD Requesting Provider: Dr. Hal Hope  HPI: Anthony Hamilton is an 85 y.o. male with CKD 3, HTN, cognitive decline who presented with AMS and is seen in consultation for evaluation and management for AKI on CKD.    Pt was recently admitted for hip fracture, discharged 2 days ago to SNF [facility]) but returned overnight for poor po intake and increased confusion.  There was no report of emesis, diarrhea, fevers, chills or pain.  He is currently alone and unable to provide any history.   On discharge his creatinine was 1.8 but was noted to be 4.7 on presentation.  Serum sodium 164, BUN 122, WBC 29.7. Initial temp 97.3, repeat 98.6; initial BP 99/66, rest normotensive  Most recently 119/61. HR low 100s.   Being treated for possible sepsis with vanc + cefepime + flagyl.   Due to hematuria noted had renal CT - no acute issues, large prostate and trabeculated bladder.  Renal US with echogenic, cystic kidneys, large prostate but no acute issues.  Per orders he's rec'd 2.25L NS and D5W (133mL/hr) so far.  No UOP has been recorded but his condom cath has amber urine draining with ~368mL in the bag; it is not grossly bloody.    PMH: History reviewed. No pertinent past medical history. PSH: Past Surgical History:  Procedure Laterality Date  . HIP ARTHROPLASTY Left 11/09/2018   Procedure: ARTHROPLASTY BIPOLAR HIP (HEMIARTHROPLASTY);  Surgeon: Hiram Gash, MD;  Location: [facility];  Service: Orthopedics;  Laterality: Left;    History reviewed. No pertinent past medical history.  Medications:  I have reviewed the patient's current medications.  Medications Prior to Admission  Medication Sig Dispense Refill  . acetaminophen (TYLENOL) 500 MG tablet Take 2 tablets (1,000 mg total) by mouth every 8 (eight) hours for 14 days. 84 tablet 0  . aspirin EC 81 MG tablet Take 81 mg by mouth daily.    [name] Kitchen docusate  (COLACE) 50 MG/5ML liquid Take 10 mLs (100 mg total) by mouth 2 (two) times daily. 100 mL 0  . enoxaparin (LOVENOX) 40 MG/0.4ML injection Inject 0.4 mLs (40 mg total) into the skin daily. 14 Syringe 1  . feeding supplement, ENSURE ENLIVE, (ENSURE ENLIVE) LIQD Take 237 mLs by mouth 2 (two) times daily between meals. 237 mL 12  . Multiple Vitamin (MULTIVITAMIN WITH MINERALS) TABS tablet Take 1 tablet by mouth daily. 30 tablet 0  . ondansetron (ZOFRAN) 4 MG tablet Take 4 mg by mouth every 8 (eight) hours as needed for nausea or vomiting.    [name] Kitchen oxyCODONE (OXY IR/ROXICODONE) 5 MG immediate release tablet Take 5 mg by mouth every 6 (six) hours as needed for severe pain.    [name] Kitchen QUEtiapine (SEROQUEL) 25 MG tablet Take 0.5 tablets (12.5 mg total) by mouth at bedtime for 7 days. 3.5 tablet 0    ALLERGIES:   Allergies  Allergen Reactions  . Ibuprofen Other (See Comments)    unknown    FAM HX: Family History  Family history unknown: Yes    Social History:   reports that he has never smoked. He has never used smokeless tobacco. He reports previous alcohol use. He reports that he does not use drugs.  ROS: unable to obtain given patient confusion  Blood pressure 126/60, pulse 73, temperature 98.6 F (37 C), temperature source Rectal, resp. rate 19, height 5\' 7"  (1.702 m), weight 68 kg, SpO2 96 %. PHYSICAL EXAM: Gen: elderly  man lying flat in no distress, restless  Eyes: anicteric, mildly injected conjunctiva ENT: MM dry, lips cracked and crusted Neck: no JVD CV:  Tachycardic, II/VI SEM Abd:  Soft, nontender Lungs: normal WOB, clear anteriorly and laterally GU:  Condom cath with amber urine Extr:  No edema Neuro: unable to answer questions coherently - mumbling and restless, wearing mittens Skin: no rashes   Results for orders placed or performed during the hospital encounter of 11/18/18 (from the past 48 hour(s))  Comprehensive metabolic panel     Status: Abnormal   Collection Time:  11/19/18 12:07 AM  Result Value Ref Range   Sodium 162 (HH) 135 - 145 mmol/L    Comment: CRITICAL RESULT CALLED TO, READ BACK BY AND VERIFIED WITH: S.LOWDERMILK,RN 0157 11/19/18 G.MCADOO    Potassium 3.9 3.5 - 5.1 mmol/L   Chloride 124 (H) 98 - 111 mmol/L   CO2 22 22 - 32 mmol/L   Glucose, Bld 150 (H) 70 - 99 mg/dL   BUN 125 (H) 8 - 23 mg/dL   Creatinine, Ser 4.79 (H) 0.61 - 1.24 mg/dL   Calcium 8.6 (L) 8.9 - 10.3 mg/dL   Total Protein 5.6 (L) 6.5 - 8.1 g/dL   Albumin 3.0 (L) 3.5 - 5.0 g/dL   AST 24 15 - 41 U/L   ALT 30 0 - 44 U/L   Alkaline Phosphatase 57 38 - 126 U/L   Total Bilirubin 1.4 (H) 0.3 - 1.2 mg/dL   GFR calc non Af Amer 10 (L) >60 mL/min   GFR calc Af Amer 12 (L) >60 mL/min   Anion gap 16 (H) 5 - 15    Comment: Performed at [facility], [place] [address] [place], [place] [address]  Lactic acid, plasma     Status: Abnormal   Collection Time: 11/19/18 12:07 AM  Result Value Ref Range   Lactic Acid, Venous 2.1 (HH) 0.5 - 1.9 mmol/L    Comment: CRITICAL RESULT CALLED TO, READ BACK BY AND VERIFIED WITH: S.LOWDERMILK,RN 0157 11/19/18 G.MCADOO Performed at [facility], [place] [address], [place], [place] [address]   CBC with Differential     Status: Abnormal   Collection Time: 11/19/18 12:07 AM  Result Value Ref Range   WBC 30.4 (H) 4.0 - 10.5 K/uL    Comment: REPEATED TO VERIFY   RBC 4.10 (L) 4.22 - 5.81 MIL/uL   Hemoglobin 12.2 (L) 13.0 - 17.0 g/dL   HCT 39.6 39.0 - 52.0 %   MCV 96.6 80.0 - 100.0 fL   MCH 29.8 26.0 - 34.0 pg   MCHC 30.8 30.0 - 36.0 g/dL   RDW 13.9 11.5 - 15.5 %   Platelets 318 150 - 400 K/uL   nRBC 0.1 0.0 - 0.2 %   Neutrophils Relative % 93 %   Neutro Abs 28.0 (H) 1.7 - 7.7 K/uL   Lymphocytes Relative 3 %   Lymphs Abs 0.8 0.7 - 4.0 K/uL   Monocytes Relative 3 %   Monocytes Absolute 1.0 0.1 - 1.0 K/uL   Eosinophils Relative 0 %   Eosinophils Absolute 0.0 0.0 - 0.5 K/uL   Basophils Relative 0 %   Basophils Absolute 0.1 0.0 - 0.1  K/uL   Immature Granulocytes 1 %   Abs Immature Granulocytes 0.38 (H) 0.00 - 0.07 K/uL    Comment: Performed at [facility] [address], [place], [place] [address]  Protime-INR     Status: Abnormal   Collection Time:  11/19/18 12:07 AM  Result Value Ref Range   Prothrombin Time 15.8 (H) 11.4 - 15.2 seconds   INR 1.3 (H) 0.8 - 1.2    Comment: (NOTE) INR goal varies based on device and disease states. Performed at [facility], [place] [address] [place], [place] [address]   SARS Coronavirus 2 (CEPHEID - Performed in [facility] hospital lab), Hosp Order     Status: None   Collection Time: 11/19/18  1:13 AM   Specimen: Nasopharyngeal Swab  Result Value Ref Range   SARS Coronavirus 2 NEGATIVE NEGATIVE    Comment: (NOTE) If result is NEGATIVE SARS-CoV-2 target nucleic acids are NOT DETECTED. The SARS-CoV-2 RNA is generally detectable in upper and lower  respiratory specimens during the acute phase of infection. The lowest  concentration of SARS-CoV-2 viral copies this assay can detect is 250  copies / mL. A negative result does not preclude SARS-CoV-2 infection  and should not be used as the sole basis for treatment or other  patient management decisions.  A negative result may occur with  improper specimen collection / handling, submission of specimen other  than nasopharyngeal swab, presence of viral mutation(s) within the  areas targeted by this assay, and inadequate number of viral copies  (<250 copies / mL). A negative result must be combined with clinical  observations, patient history, and epidemiological information. If result is POSITIVE SARS-CoV-2 target nucleic acids are DETECTED. The SARS-CoV-2 RNA is generally detectable in upper and lower  respiratory specimens dur ing the acute phase of infection.  Positive  results are indicative of active infection with SARS-CoV-2.  Clinical  correlation with patient history and other diagnostic information is   necessary to determine patient infection status.  Positive results do  not rule out bacterial infection or co-infection with other viruses. If result is PRESUMPTIVE POSTIVE SARS-CoV-2 nucleic acids MAY BE PRESENT.   A presumptive positive result was obtained on the submitted specimen  and confirmed on repeat testing.  While 2019 novel coronavirus  (SARS-CoV-2) nucleic acids may be present in the submitted sample  additional confirmatory testing may be necessary for epidemiological  and / or clinical management purposes  to differentiate between  SARS-CoV-2 and other Sarbecovirus currently known to infect humans.  If clinically indicated additional testing with an alternate test  methodology [phone]) is advised. The SARS-CoV-2 RNA is generally  detectable in upper and lower respiratory sp ecimens during the acute  phase of infection. The expected result is Negative. Fact Sheet for Patients:  StrictlyIdeas.no Fact Sheet for Healthcare Providers: BankingDealers.co.za This test is not yet approved or cleared by the [place] FDA and has been authorized for detection and/or diagnosis of SARS-CoV-2 by FDA under an Emergency Use Authorization (EUA).  This EUA will remain in effect (meaning this test can be used) for the duration of the COVID-19 declaration under Section 564(b)(1) of the Act, 21 U.S.C. section 360bbb-3(b)(1), unless the authorization is terminated or revoked sooner. Performed at [facility], [place] [address] [place],  [address]   Urinalysis, Routine w reflex microscopic     Status: Abnormal   Collection Time: 11/19/18  1:42 AM  Result Value Ref Range   Color, Urine YELLOW YELLOW   APPearance CLOUDY (A) CLEAR   Specific Gravity, Urine 1.016 1.005 - 1.030   pH 5.0 5.0 - 8.0   Glucose, UA NEGATIVE NEGATIVE mg/dL   Hgb urine dipstick LARGE (A) NEGATIVE   Bilirubin Urine NEGATIVE NEGATIVE  Ketones, ur  NEGATIVE NEGATIVE mg/dL   Protein, ur NEGATIVE NEGATIVE mg/dL   Nitrite NEGATIVE NEGATIVE   Leukocytes,Ua NEGATIVE NEGATIVE   RBC / HPF >50 (H) 0 - 5 RBC/hpf   WBC, UA 0-5 0 - 5 WBC/hpf   Bacteria, UA RARE (A) NONE SEEN   Squamous Epithelial / LPF 0-5 0 - 5   Mucus PRESENT     Comment: Performed at [facility], [place] [address], [place], [place] [address]  Sodium, urine, random     Status: None   Collection Time: 11/19/18  1:42 AM  Result Value Ref Range   Sodium, Ur 11 mmol/L    Comment: Performed at [facility] [address], [place], [place] [address]  Osmolality, urine     Status: None   Collection Time: 11/19/18  1:42 AM  Result Value Ref Range   Osmolality, Ur 490 300 - 900 mOsm/kg    Comment: Performed at [facility] [address], [place], [place] [address]  Lactic acid, plasma     Status: None   Collection Time: 11/19/18  2:39 AM  Result Value Ref Range   Lactic Acid, Venous 1.4 0.5 - 1.9 mmol/L    Comment: Performed at [facility] [address], [place], [place] [address]  Basic metabolic panel     Status: Abnormal   Collection Time: 11/19/18  8:43 AM  Result Value Ref Range   Sodium 164 (HH) 135 - 145 mmol/L    Comment: CRITICAL RESULT CALLED TO, READ BACK BY AND VERIFIED WITH: M.COFFEY,RN 0929 11/19/2018 CLARK,S    Potassium 5.0 3.5 - 5.1 mmol/L   Chloride 129 (H) 98 - 111 mmol/L   CO2 21 (L) 22 - 32 mmol/L   Glucose, Bld 180 (H) 70 - 99 mg/dL   BUN 122 (H) 8 - 23 mg/dL   Creatinine, Ser 4.98 (H) 0.61 - 1.24 mg/dL   Calcium 7.9 (L) 8.9 - 10.3 mg/dL   GFR calc non Af Amer 10 (L) >60 mL/min   GFR calc Af Amer 11 (L) >60 mL/min   Anion gap 14 5 - 15    Comment: Performed at [facility] [address] [place], [place] [address]  CBC     Status: Abnormal   Collection Time: 11/19/18  8:43 AM  Result Value Ref Range   WBC 29.7 (H) 4.0 - 10.5 K/uL   RBC 3.64 (L) 4.22 - 5.81 MIL/uL   Hemoglobin 10.9 (L) 13.0 - 17.0 g/dL   HCT  35.7 (L) 39.0 - 52.0 %   MCV 98.1 80.0 - 100.0 fL   MCH 29.9 26.0 - 34.0 pg   MCHC 30.5 30.0 - 36.0 g/dL   RDW 13.7 11.5 - 15.5 %   Platelets 259 150 - 400 K/uL   nRBC 0.0 0.0 - 0.2 %    Comment: Performed at [facility], [place] [address] [place],  [address]  CBG monitoring, ED     Status: Abnormal   Collection Time: 11/19/18 11:21 AM  Result Value Ref Range   Glucose-Capillary 154 (H) 70 - 99 mg/dL  Basic metabolic panel     Status: Abnormal   Collection Time: 11/19/18 12:17 PM  Result Value Ref Range   Sodium 161 (HH) 135 - 145 mmol/L    Comment: CRITICAL RESULT CALLED TO, READ BACK BY AND VERIFIED WITH: M.COFFEY,RN 1318 11/19/2018 CLARK,S    Potassium 3.7 3.5 - 5.1 mmol/L  Chloride 129 (H) 98 - 111 mmol/L   CO2 21 (L) 22 - 32 mmol/L   Glucose, Bld 173 (H) 70 - 99 mg/dL   BUN 122 (H) 8 - 23 mg/dL   Creatinine, Ser 4.49 (H) 0.61 - 1.24 mg/dL   Calcium 7.8 (L) 8.9 - 10.3 mg/dL   GFR calc non Af Amer 11 (L) >60 mL/min   GFR calc Af Amer 13 (L) >60 mL/min   Anion gap 11 5 - 15    Comment: Performed at [facility] [address], [place], [place] [address]  Hemoglobin A1c     Status: None   Collection Time: 11/19/18 12:17 PM  Result Value Ref Range   Hgb A1c MFr Bld 5.1 4.8 - 5.6 %    Comment: (NOTE) Pre diabetes:          5.7%-6.4% Diabetes:              >6.4% Glycemic control for   <7.0% adults with diabetes    Mean Plasma Glucose 99.67 mg/dL    Comment: Performed at [facility] [address], [place], [place] [address]  CBG monitoring, ED     Status: Abnormal   Collection Time: 11/19/18  3:15 PM  Result Value Ref Range   Glucose-Capillary 125 (H) 70 - 99 mg/dL    Dg Chest 2 View  Result Date: 11/19/2018 CLINICAL DATA:  Sepsis EXAM: CHEST - 2 VIEW COMPARISON:  November 15, 2018 FINDINGS: There is no large infiltrate. The heart size is stable. There is no pneumothorax. No acute osseous abnormality. The lungs are essentially clear. The  previously noted left basilar airspace opacity is no longer well visualized. IMPRESSION: No active cardiopulmonary disease. Electronically Signed   By: Constance Holster M.D.   On: 11/19/2018 01:30   Ct Head Wo Contrast  Result Date: 11/19/2018 CLINICAL DATA:  Altered level of consciousness. EXAM: CT HEAD WITHOUT CONTRAST TECHNIQUE: Contiguous axial images were obtained from the base of the skull through the vertex without intravenous contrast. COMPARISON:  None. FINDINGS: Brain: No evidence of acute infarction, hemorrhage, hydrocephalus, extra-axial collection or mass lesion/mass effect. Volume loss is noted. Chronic microvascular ischemic changes are noted. Evaluation was somewhat limited by motion artifact. Vascular: No hyperdense vessel or unexpected calcification. Skull: Normal. Negative for fracture or focal lesion. Sinuses/Orbits: No acute finding. Other: None. IMPRESSION: 1. No acute intracranial abnormality. 2. Volume loss and chronic microvascular ischemic changes are noted. Electronically Signed   By: Constance Holster M.D.   On: 11/19/2018 03:51   US Renal  Result Date: 11/19/2018 CLINICAL DATA:  Acute kidney injury. EXAM: RENAL / URINARY TRACT ULTRASOUND COMPLETE COMPARISON:  Ultrasound of April 18, 2010. CT scan of November 19, 2018. FINDINGS: Right Kidney: Renal measurements: 19.6 x 10.5 x 8.4 cm = volume: None 107 mL. Increased echogenicity of renal parenchyma is noted suggesting medical renal disease. Greater than 10 cysts are noted in the right kidney, with the largest simple cyst measuring 10.7 cm in the lower pole. 7.9 cm cyst is seen in upper pole. No definite hydronephrosis is noted. Left Kidney: Renal measurements: 12.7 x 5.8 x 4.3 cm = volume: 164 mL. Increased echogenicity of renal parenchyma is noted suggesting medical renal disease. Greater than 10 cysts are noted, with the largest measuring 4.2 cm in lower pole. No hydronephrosis is noted. Bladder: Bladder diverticulum is noted.  Slightly irregular wall of urinary bladder is noted suggesting trabeculation. Large prostate gland is noted. IMPRESSION: Increased  echogenicity of renal parenchyma is noted bilaterally suggesting medical renal disease. Bilateral renal cysts are noted as described above. Bladder diverticulum is noted.  Large prostate gland is noted. Electronically Signed   By: Marijo Conception M.D.   On: 11/19/2018 15:19   Ct Renal Stone Study  Result Date: 11/19/2018 CLINICAL DATA:  Hematuria with unknown cause EXAM: CT ABDOMEN AND PELVIS WITHOUT CONTRAST TECHNIQUE: Multidetector CT imaging of the abdomen and pelvis was performed following the standard protocol without IV contrast. COMPARISON:  05/15/2007 FINDINGS: Lower chest:  Mild dependent atelectasis.  Coronary calcification Hepatobiliary: No focal liver abnormality.Cholecystectomy which likely accounts for prominent bile duct dimensions. Pancreas: Generalized fatty atrophy. 2 cm cystic density along the upper pancreas that is new from prior. Spleen: Unremarkable. Adrenals/Urinary Tract: Small bilateral nodules that are stable and attributed adenoma . Multiple bilateral renal cysts including a lobulated posterior interpolar cyst on the right with thin benign-appearing septations that are stable. A large right lower pole cyst measures 11 cm. Accounting for hilar cysts there is no hydronephrosis. Degree of bilateral renal atrophy. Distended bladder with diverticula at the apex. Stomach/Bowel:  No obstruction. No evidence of bowel inflammation. Vascular/Lymphatic: Atherosclerotic calcification. Mesenteric stranding in 2008 has resolved, now with small calcified nodes in this area. Reproductive:Marked thickening of the prostate. Other: Left inguinal hernia containing non thickened and nonobstructed sigmoid colon Musculoskeletal: Fluid and bubble of gas about the left hip arthroplasty correlating with recent placement. No acute osseous finding IMPRESSION: 1. No acute finding or  specific explanation for hematuria. 2. Multiple renal cysts with benign mural calcification on the right. 3. Marked enlargement of the prostate with bladder wall thickening and diverticula attributed to chronic outlet obstruction. 4. 2 cm cystic density nodule along the upper pancreas that is new from 2008. MR follow-up could be obtained if appropriate for life expectancy. 5. Left inguinal hernia containing sigmoid colon. Electronically Signed   By: Monte Fantasia M.D.   On: 11/19/2018 06:09    Assessment/Plan **AKI on CKD 3:  Baseline Cr just 2 days ago 1.8, now with AKI Cr 4.7.  With h/o poor po intake likely mainly prerenal azotemia, however with low BP on presentation and concern for sepsis I'm concerned he may have developed some ischemic ATN injury as well.  Low urine sodium on presentation is consistent with prerenal and hopefully he will continue to improve with volume resuscitation.  Has some UOP which is encouraging.  Bladder scan Qshift given large prostate and trabeculated bladder.  Continue to monitor renal function with AM labs.  He still appears hypovolemic to me and I will switch fluids to D51/2NS to provide volume and free water.    **Hypernatremia:  Developing at end of recently hospitalization with Na 155 at discharge, now 162-164.   Secondary to poor po intake.  Cont free water administration to gradually correct hypernatremia over the next several days.  Per above switch D5W to D51/2NS to provide some volume as well.  By calculation free water deficit ~5L so it will take some time to correct.    **Hematuria:  No visible hematuria on urine in foley bag, imaging unrevealing.  No acute intervention needed.    **Sepsis:  Broad spectrum antibiotics renally dosed per primary.  Cultures pending.    **normocytic anemia: mild, per primary.   Will follow, call with concerns.   Justin Mend 11/19/2018, 5:25 PM   ADDENDUM:  RN bladder scanned - 350mL, insert Foley catheter.

## 2018-11-19 NOTE — ED Notes (Signed)
Spoke to Pt.'s wife. Informed her d/t pts AMS she would be able to stay with pt while in ED. Per wife, she is uncomfortable driving at night and has no one to bring her to hospital. Wife wishes to be updated during the morning.

## 2018-11-19 NOTE — Progress Notes (Signed)
Pharmacy Antibiotic Note  Anthony Hamilton is a 85 y.o. male admitted on 11/18/2018 with confusion.  Pharmacy has been consulted for Vancomycin/Cefepime dosing. WBC markedly elevated from a few days ago. Acute on chronic renal failure. CXR clear.   Plan: Vancomycin 500 mg IV q48h >>Estimated AUC: 403 Cefepime 1g IV q24h Trend WBC, temp, renal function  F/U infectious work-up Drug levels as indicated  Height: 5\' 7"  (170.2 cm) Weight: 150 lb (68 kg) IBW/kg (Calculated) : 66.1  Temp (24hrs), Avg:98 F (36.7 C), Min:97.3 F (36.3 C), Max:98.6 F (37 C)  Recent Labs  Lab 11/12/18 0715 11/14/18 1007 11/16/18 0445 11/19/18 0007 11/19/18 0239  WBC 8.5 15.5* 13.4* 30.4*  --   CREATININE 1.70* 1.76* 1.86* 4.79*  --   LATICACIDVEN  --   --   --  2.1* 1.4    Estimated Creatinine Clearance: 10 mL/min (A) (by C-G formula based on SCr of 4.79 mg/dL (H)).    Allergies  Allergen Reactions  . Ibuprofen Other (See Comments)    unknown     Anthony Hamilton 11/19/2018 5:41 AM

## 2018-11-19 NOTE — Progress Notes (Signed)
Palliative:  Consult received and chart reviewed. Attempted to call patient's spouse to discuss goals of care - no answer, left voice mail with call back number.  PMT will follow up 6/25.  Thank you for this consult.  Juel Burrow, DNP, AGNP-C Palliative Medicine Team Team Phone # [phone]  Pager # [phone]  NO CHARGE

## 2018-11-19 NOTE — H&P (Signed)
 History and Physical    Anthony Hamilton GBT:[id number] DOB: 06-23-1927 DOA: 11/18/2018  PCP: Seward Carol, MD  Patient coming from: [facility].  Chief Complaint: Increased confusion.  History obtained from patient's wife.  HPI: Anthony Hamilton is a 85 y.o. male with known history of chronic kidney disease stage III hypertension and cognitive decline admitted recently for left hip fracture was discharged 2 days ago to rehab was brought to back to the ER after patient was having poor appetite not eating well increasing confusion.  Per patient's wife who provided the history patient was not eating anything but did not have any vomiting or diarrhea.  Did not have any fever chills or chest pain.  ED Course: In the ER patient was afebrile mildly agitated with labs showing creatinine of 4.7 which is increased from 1.83 days ago.  Sodium was 162 WBC count is 30.4 hemoglobin 12.2 UA showing hematuria chest x-ray unremarkable CT head unremarkable EKG showing sinus tachycardia.  Patient given the significant increase in WBC count was started on empiric antibiotics and was placed on sepsis protocol and also hydrated for hypernatremia with acute renal failure.  On my exam patient is completely confused only responds to his name does not follow commands.  Review of Systems: As per HPI, rest all negative.   History reviewed. No pertinent past medical history.  Past Surgical History:  Procedure Laterality Date  . HIP ARTHROPLASTY Left 11/09/2018   Procedure: ARTHROPLASTY BIPOLAR HIP (HEMIARTHROPLASTY);  Surgeon: Hiram Gash, MD;  Location: [facility];  Service: Orthopedics;  Laterality: Left;     reports that he has never smoked. He has never used smokeless tobacco. He reports previous alcohol use. He reports that he does not use drugs.  Allergies  Allergen Reactions  . Ibuprofen Other (See Comments)    unknown    Family History  Family history unknown: Yes    Prior to  Admission medications   Medication Sig Start Date End Date Taking? Authorizing Provider  acetaminophen (TYLENOL) 500 MG tablet Take 2 tablets (1,000 mg total) by mouth every 8 (eight) hours for 14 days. 11/10/18  Yes Hiram Gash, MD  aspirin EC 81 MG tablet Take 81 mg by mouth daily.   Yes [provider]  docusate (COLACE) 50 MG/5ML liquid Take 10 mLs (100 mg total) by mouth 2 (two) times daily. 11/17/18  Yes Dana Allan I, MD  enoxaparin (LOVENOX) 40 MG/0.4ML injection Inject 0.4 mLs (40 mg total) into the skin daily. 11/10/18 11/10/19 Yes Hiram Gash, MD  feeding supplement, ENSURE ENLIVE, (ENSURE ENLIVE) LIQD Take 237 mLs by mouth 2 (two) times daily between meals. 11/17/18  Yes Bonnell Public, MD  Multiple Vitamin (MULTIVITAMIN WITH MINERALS) TABS tablet Take 1 tablet by mouth daily. 11/18/18  Yes Bonnell Public, MD  ondansetron (ZOFRAN) 4 MG tablet Take 4 mg by mouth every 8 (eight) hours as needed for nausea or vomiting. 11/17/18  Yes [provider]  oxyCODONE (OXY IR/ROXICODONE) 5 MG immediate release tablet Take 5 mg by mouth every 6 (six) hours as needed for severe pain.   Yes [provider]  QUEtiapine (SEROQUEL) 25 MG tablet Take 0.5 tablets (12.5 mg total) by mouth at bedtime for 7 days. 11/17/18  Yes Bonnell Public, MD    Physical Exam: Vitals:   11/19/18 0145 11/19/18 0215 11/19/18 0230 11/19/18 0245  BP: (!) 107/94 113/89 (!) 132/47 111/66  Pulse: (!) 113 (!) 110 (!) 104 [name] Kitchen)  103  Resp: 20 19 (!) 22 19  Temp:      TempSrc:      SpO2: 100% 100% 100% 100%  Weight:      Height:          Constitutional: Moderately built and nourished. Vitals:   11/19/18 0145 11/19/18 0215 11/19/18 0230 11/19/18 0245  BP: (!) 107/94 113/89 (!) 132/47 111/66  Pulse: (!) 113 (!) 110 (!) 104 (!) 103  Resp: 20 19 (!) 22 19  Temp:      TempSrc:      SpO2: 100% 100% 100% 100%  Weight:      Height:       Eyes: Anicteric no  pallor. ENMT: No discharge from the ears eyes nose or mouth. Neck: No mass felt.  No neck rigidity. Respiratory: No rhonchi or crepitations. Cardiovascular: S1-S2 heard. Abdomen: Soft nontender bowel sounds present. Musculoskeletal: No edema.  No joint effusion. Skin: No rash. Neurologic: Patient alert awake confused does not follow commands moves all extremities.  Pupils equal and reacting to light. Psychiatric: Confused.   Labs on Admission: I have personally reviewed following labs and imaging studies  CBC: Recent Labs  Lab 11/12/18 0715 11/14/18 1007 11/16/18 0445 11/19/18 0007  WBC 8.5 15.5* 13.4* 30.4*  NEUTROABS  --  13.4* 11.1* 28.0*  HGB 10.0* 12.2* 11.3* 12.2*  HCT 30.4* 37.8* 35.7* 39.6  MCV 90.7 92.6 92.7 96.6  PLT 154 244 272 449   Basic Metabolic Panel: Recent Labs  Lab 11/12/18 0715 11/14/18 1007 11/16/18 0445 11/19/18 0007  NA 147* 151* 155* 162*  K 3.8 3.6 3.6 3.9  CL 113* 116* 120* 124*  CO2 24 23 27 22   GLUCOSE 137* 163* 164* 150*  BUN 49* 59* 59* 125*  CREATININE 1.70* 1.76* 1.86* 4.79*  CALCIUM 8.7* 9.2 8.8* 8.6*  MG 2.3 2.4 2.5*  --   PHOS 4.0 3.1 3.6  --    GFR: Estimated Creatinine Clearance: 10 mL/min (A) (by C-G formula based on SCr of 4.79 mg/dL (H)). Liver Function Tests: Recent Labs  Lab 11/12/18 0715 11/14/18 1007 11/16/18 0445 11/19/18 0007  AST  --   --   --  24  ALT  --   --   --  30  ALKPHOS  --   --   --  57  BILITOT  --   --   --  1.4*  PROT  --   --   --  5.6*  ALBUMIN 2.7* 3.3* 3.0* 3.0*   No results for input(s): LIPASE, AMYLASE in the last 168 hours. No results for input(s): AMMONIA in the last 168 hours. Coagulation Profile: Recent Labs  Lab 11/19/18 0007  INR 1.3*   Cardiac Enzymes: No results for input(s): CKTOTAL, CKMB, CKMBINDEX, TROPONINI in the last 168 hours. BNP (last 3 results) No results for input(s): PROBNP in the last 8760 hours. HbA1C: No results for input(s): HGBA1C in the last 72 hours.  CBG: No results for input(s): GLUCAP in the last 168 hours. Lipid Profile: No results for input(s): CHOL, HDL, LDLCALC, TRIG, CHOLHDL, LDLDIRECT in the last 72 hours. Thyroid Function Tests: No results for input(s): TSH, T4TOTAL, FREET4, T3FREE, THYROIDAB in the last 72 hours. Anemia Panel: No results for input(s): VITAMINB12, FOLATE, FERRITIN, TIBC, IRON, RETICCTPCT in the last 72 hours. Urine analysis:    Component Value Date/Time   COLORURINE YELLOW 11/19/2018 0142   APPEARANCEUR CLOUDY (A) 11/19/2018 0142   LABSPEC 1.016 11/19/2018 0142   PHURINE  5.0 11/19/2018 0142   GLUCOSEU NEGATIVE 11/19/2018 0142   HGBUR LARGE (A) 11/19/2018 0142   BILIRUBINUR NEGATIVE 11/19/2018 0142   KETONESUR NEGATIVE 11/19/2018 0142   PROTEINUR NEGATIVE 11/19/2018 0142   UROBILINOGEN 0.2 04/12/2007 1602   NITRITE NEGATIVE 11/19/2018 0142   LEUKOCYTESUR NEGATIVE 11/19/2018 0142   Sepsis Labs: @LABRCNTIP (procalcitonin:4,lacticidven:4) ) Recent Results (from the past 240 hour(s))  Novel Coronavirus, NAA (hospital order; send-out to ref lab)     Status: None   Collection Time: 11/09/18  3:35 AM   Specimen: Nasopharyngeal Swab; Respiratory  Result Value Ref Range Status   SARS-CoV-2, NAA NOT DETECTED NOT DETECTED Final    Comment: (NOTE) This test was developed and its performance characteristics determined by [employer]. This test has not been FDA cleared or approved. This test has been authorized by FDA under an Emergency Use Authorization (EUA). This test is only authorized for the duration of time the declaration that circumstances exist justifying the authorization of the emergency use of in vitro diagnostic tests for detection of SARS-CoV-2 virus and/or diagnosis of COVID-19 infection under section 564(b)(1) of the Act, 21 U.S.C. 539JQB-3(A)(1), unless the authorization is terminated or revoked sooner. When diagnostic testing is negative, the possibility of a false negative result  should be considered in the context of a patient's recent exposures and the presence of clinical signs and symptoms consistent with COVID-19. An individual without symptoms of COVID-19 and who is not shedding SARS-CoV-2 virus would expect to have a negative (not detected) result in this assay. Performed  At: [facility] [place], [place] [id number] Rush Farmer MD BD:[id number]    [place]  Final    Comment: Performed at [facility] [address], [place], [place] [address]  SARS Coronavirus 2 (CEPHEID - Performed in [facility] hospital lab), Hosp Order     Status: None   Collection Time: 11/19/18  1:13 AM   Specimen: Nasopharyngeal Swab  Result Value Ref Range Status   SARS Coronavirus 2 NEGATIVE NEGATIVE Final    Comment: (NOTE) If result is NEGATIVE SARS-CoV-2 target nucleic acids are NOT DETECTED. The SARS-CoV-2 RNA is generally detectable in upper and lower  respiratory specimens during the acute phase of infection. The lowest  concentration of SARS-CoV-2 viral copies this assay can detect is 250  copies / mL. A negative result does not preclude SARS-CoV-2 infection  and should not be used as the sole basis for treatment or other  patient management decisions.  A negative result may occur with  improper specimen collection / handling, submission of specimen other  than nasopharyngeal swab, presence of viral mutation(s) within the  areas targeted by this assay, and inadequate number of viral copies  (<250 copies / mL). A negative result must be combined with clinical  observations, patient history, and epidemiological information. If result is POSITIVE SARS-CoV-2 target nucleic acids are DETECTED. The SARS-CoV-2 RNA is generally detectable in upper and lower  respiratory specimens dur ing the acute phase of infection.  Positive  results are indicative of active infection with SARS-CoV-2.  Clinical   correlation with patient history and other diagnostic information is  necessary to determine patient infection status.  Positive results do  not rule out bacterial infection or co-infection with other viruses. If result is PRESUMPTIVE POSTIVE SARS-CoV-2 nucleic acids MAY BE PRESENT.   A presumptive positive result was obtained on the submitted specimen  and confirmed on repeat testing.  While 2019 novel coronavirus  (  SARS-CoV-2) nucleic acids may be present in the submitted sample  additional confirmatory testing may be necessary for epidemiological  and / or clinical management purposes  to differentiate between  SARS-CoV-2 and other Sarbecovirus currently known to infect humans.  If clinically indicated additional testing with an alternate test  methodology [phone]) is advised. The SARS-CoV-2 RNA is generally  detectable in upper and lower respiratory sp ecimens during the acute  phase of infection. The expected result is Negative. Fact Sheet for Patients:  StrictlyIdeas.no Fact Sheet for Healthcare Providers: BankingDealers.co.za This test is not yet approved or cleared by the [place] FDA and has been authorized for detection and/or diagnosis of SARS-CoV-2 by FDA under an Emergency Use Authorization (EUA).  This EUA will remain in effect (meaning this test can be used) for the duration of the COVID-19 declaration under Section 564(b)(1) of the Act, 21 U.S.C. section 360bbb-3(b)(1), unless the authorization is terminated or revoked sooner. Performed at [facility], [place] [address] [place],  [address]      Radiological Exams on Admission: Dg Chest 2 View  Result Date: 11/19/2018 CLINICAL DATA:  Sepsis EXAM: CHEST - 2 VIEW COMPARISON:  November 15, 2018 FINDINGS: There is no large infiltrate. The heart size is stable. There is no pneumothorax. No acute osseous abnormality. The lungs are essentially clear. The  previously noted left basilar airspace opacity is no longer well visualized. IMPRESSION: No active cardiopulmonary disease. Electronically Signed   By: Constance Holster M.D.   On: 11/19/2018 01:30    EKG: Independently reviewed.  Normal sinus rhythm.  Not positional changes.  Assessment/Plan Principal Problem:   ARF (acute renal failure) (HCC) Active Problems:   Essential hypertension   Hypernatremia   Acute encephalopathy    1. Acute renal failure with hypernatremia -likely from patient not eating anything.  Patient did receive sepsis protocol fluid bolus in the ER.  I have kept patient on D5W.  Closely follow metabolic panel intake output. 2. Leukocytosis with no definite evidence of infection but since patient has significant leukocytosis patient is placed on empiric antibiotics follow cultures. 3. Hematuria with renal failure -follow CBC CT renal study is pending.  Check bladder scans to make sure there is no urinary retention. 4. Dementia with cognitive decline and agitation -I have placed patient on mittens and also order 1 dose of 0.25 IV Ativan.  Closely observe for now. 5. Recent left hip fracture status post surgery.  Since patient has significant hematuria will have to hold pharmacological DVT prophylaxis and place patient on SCDs.   DVT prophylaxis: SCDs due to hematuria. Code Status: DNR as confirmed with patient's wife. Family Communication: Patient's wife. Disposition Plan: To be determined. Consults called: Palliative care. Admission status: Inpatient.   [name] MD [facility] Pager [phone].  If 7PM-7AM, please contact night-coverage [email or web address] Password [email or web address]  11/19/2018, 2:52 AM

## 2018-11-19 NOTE — ED Notes (Signed)
Date and time results received: 11/19/18  (use smartphrase ".now" to insert current time)  Test:  Lactic  Critical Value: 2.1  Test: Sodium Critical Value:162   Name of Provider Notified: C. Horton  Orders Received? Or Actions Taken?: Actions Taken:  Verbal Order [facility]

## 2018-11-19 NOTE — ED Notes (Signed)
Patient transported to CT 

## 2018-11-19 NOTE — ED Notes (Addendum)
ED TO INPATIENT HANDOFF REPORT  ED Nurse Name and Phone #:  Dalphine Handing RN [phone]  S Name/Age/Gender Anthony Hamilton 85 y.o. male Room/Bed: 035C/035C  Code Status   Code Status: DNR  Home/SNF/Other Skilled nursing facility Patient oriented to: self Is this baseline? No   Triage Complete: Triage complete  Chief Complaint Posiible Sepsis, Altered Mental Status  Triage Note Pt BIB EMS from [facility] d/t possible code sepsis. Pt. Had recent fall. On 11/09/2018 pt. Had surgery to repair left femur fracture and hip. Pt. Stopped talking/eating yesterday.  Pt oriented to person. Pt. incomprehensible speech.  Information gathered from wife via phone.    Allergies Allergies  Allergen Reactions  . Ibuprofen Other (See Comments)    unknown    Level of Care/Admitting Diagnosis ED Disposition    ED Disposition Condition Comment   Admit  Hospital Area: [facility] [[id number]]  Level of Care: Progressive [102]  Covid Evaluation: Screening Protocol (No Symptoms)  Diagnosis: ARF (acute renal failure) [facility]) [[id number]]  Admitting Physician: [name] [phone]  Attending Physician: [name] [phone]  Estimated length of stay: past midnight tomorrow  Certification:: I certify this patient will need inpatient services for at least 2 midnights  PT Class (Do Not Modify): Inpatient [101]  PT Acc Code (Do Not Modify): Private [1]       B Medical/Surgery History History reviewed. No pertinent past medical history. Past Surgical History:  Procedure Laterality Date  . HIP ARTHROPLASTY Left 11/09/2018   Procedure: ARTHROPLASTY BIPOLAR HIP (HEMIARTHROPLASTY);  Surgeon: Hiram Gash, MD;  Location: [facility];  Service: Orthopedics;  Laterality: Left;     A IV Location/Drains/Wounds Patient Lines/Drains/Airways Status   Active Line/Drains/Airways    Name:   Placement date:   Placement time:   Site:   Days:   Peripheral IV 11/19/18 Right Antecubital    11/19/18    0005    Antecubital   less than 1   Incision (Closed) 11/09/18 Hip Left   11/09/18    0934     10          Intake/Output Last 24 hours No intake or output data in the 24 hours ending 11/19/18 0554  Labs/Imaging Results for orders placed or performed during the hospital encounter of 11/18/18 (from the past 48 hour(s))  Comprehensive metabolic panel     Status: Abnormal   Collection Time: 11/19/18 12:07 AM  Result Value Ref Range   Sodium 162 (HH) 135 - 145 mmol/L    Comment: CRITICAL RESULT CALLED TO, READ BACK BY AND VERIFIED WITH: S.LOWDERMILK,RN 0157 11/19/18 G.MCADOO    Potassium 3.9 3.5 - 5.1 mmol/L   Chloride 124 (H) 98 - 111 mmol/L   CO2 22 22 - 32 mmol/L   Glucose, Bld 150 (H) 70 - 99 mg/dL   BUN 125 (H) 8 - 23 mg/dL   Creatinine, Ser 4.79 (H) 0.61 - 1.24 mg/dL   Calcium 8.6 (L) 8.9 - 10.3 mg/dL   Total Protein 5.6 (L) 6.5 - 8.1 g/dL   Albumin 3.0 (L) 3.5 - 5.0 g/dL   AST 24 15 - 41 U/L   ALT 30 0 - 44 U/L   Alkaline Phosphatase 57 38 - 126 U/L   Total Bilirubin 1.4 (H) 0.3 - 1.2 mg/dL   GFR calc non Af Amer 10 (L) >60 mL/min   GFR calc Af Amer 12 (L) >60 mL/min   Anion gap 16 (H) 5 - 15  Comment: Performed at [facility], [place] [address] [place], [place] [address]  Lactic acid, plasma     Status: Abnormal   Collection Time: 11/19/18 12:07 AM  Result Value Ref Range   Lactic Acid, Venous 2.1 (HH) 0.5 - 1.9 mmol/L    Comment: CRITICAL RESULT CALLED TO, READ BACK BY AND VERIFIED WITH: S.LOWDERMILK,RN 0157 11/19/18 G.MCADOO Performed at [facility], [place] [address] [place], [place] [address]   CBC with Differential     Status: Abnormal   Collection Time: 11/19/18 12:07 AM  Result Value Ref Range   WBC 30.4 (H) 4.0 - 10.5 K/uL    Comment: REPEATED TO VERIFY   RBC 4.10 (L) 4.22 - 5.81 MIL/uL   Hemoglobin 12.2 (L) 13.0 - 17.0 g/dL   HCT 39.6 39.0 - 52.0 %   MCV 96.6 80.0 - 100.0 fL   MCH 29.8 26.0 - 34.0 pg   MCHC 30.8 30.0 - 36.0 g/dL    RDW 13.9 11.5 - 15.5 %   Platelets 318 150 - 400 K/uL   nRBC 0.1 0.0 - 0.2 %   Neutrophils Relative % 93 %   Neutro Abs 28.0 (H) 1.7 - 7.7 K/uL   Lymphocytes Relative 3 %   Lymphs Abs 0.8 0.7 - 4.0 K/uL   Monocytes Relative 3 %   Monocytes Absolute 1.0 0.1 - 1.0 K/uL   Eosinophils Relative 0 %   Eosinophils Absolute 0.0 0.0 - 0.5 K/uL   Basophils Relative 0 %   Basophils Absolute 0.1 0.0 - 0.1 K/uL   Immature Granulocytes 1 %   Abs Immature Granulocytes 0.38 (H) 0.00 - 0.07 K/uL    Comment: Performed at [facility] [address], [place], [place] [address]  Protime-INR     Status: Abnormal   Collection Time: 11/19/18 12:07 AM  Result Value Ref Range   Prothrombin Time 15.8 (H) 11.4 - 15.2 seconds   INR 1.3 (H) 0.8 - 1.2    Comment: (NOTE) INR goal varies based on device and disease states. Performed at [facility], [place] [address] [place], [place] [address]   SARS Coronavirus 2 (CEPHEID - Performed in [facility] hospital lab), Hosp Order     Status: None   Collection Time: 11/19/18  1:13 AM   Specimen: Nasopharyngeal Swab  Result Value Ref Range   SARS Coronavirus 2 NEGATIVE NEGATIVE    Comment: (NOTE) If result is NEGATIVE SARS-CoV-2 target nucleic acids are NOT DETECTED. The SARS-CoV-2 RNA is generally detectable in upper and lower  respiratory specimens during the acute phase of infection. The lowest  concentration of SARS-CoV-2 viral copies this assay can detect is 250  copies / mL. A negative result does not preclude SARS-CoV-2 infection  and should not be used as the sole basis for treatment or other  patient management decisions.  A negative result may occur with  improper specimen collection / handling, submission of specimen other  than nasopharyngeal swab, presence of viral mutation(s) within the  areas targeted by this assay, and inadequate number of viral copies  (<250 copies / mL). A negative result must be combined with clinical   observations, patient history, and epidemiological information. If result is POSITIVE SARS-CoV-2 target nucleic acids are DETECTED. The SARS-CoV-2 RNA is generally detectable in upper and lower  respiratory specimens dur ing the acute phase of infection.  Positive  results are indicative of active infection with SARS-CoV-2.  Clinical  correlation with patient  history and other diagnostic information is  necessary to determine patient infection status.  Positive results do  not rule out bacterial infection or co-infection with other viruses. If result is PRESUMPTIVE POSTIVE SARS-CoV-2 nucleic acids MAY BE PRESENT.   A presumptive positive result was obtained on the submitted specimen  and confirmed on repeat testing.  While 2019 novel coronavirus  (SARS-CoV-2) nucleic acids may be present in the submitted sample  additional confirmatory testing may be necessary for epidemiological  and / or clinical management purposes  to differentiate between  SARS-CoV-2 and other Sarbecovirus currently known to infect humans.  If clinically indicated additional testing with an alternate test  methodology [phone]) is advised. The SARS-CoV-2 RNA is generally  detectable in upper and lower respiratory sp ecimens during the acute  phase of infection. The expected result is Negative. Fact Sheet for Patients:  StrictlyIdeas.no Fact Sheet for Healthcare Providers: BankingDealers.co.za This test is not yet approved or cleared by the [place] FDA and has been authorized for detection and/or diagnosis of SARS-CoV-2 by FDA under an Emergency Use Authorization (EUA).  This EUA will remain in effect (meaning this test can be used) for the duration of the COVID-19 declaration under Section 564(b)(1) of the Act, 21 U.S.C. section 360bbb-3(b)(1), unless the authorization is terminated or revoked sooner. Performed at [facility], [place] [address],  [place], [place] [address]   Urinalysis, Routine w reflex microscopic     Status: Abnormal   Collection Time: 11/19/18  1:42 AM  Result Value Ref Range   Color, Urine YELLOW YELLOW   APPearance CLOUDY (A) CLEAR   Specific Gravity, Urine 1.016 1.005 - 1.030   pH 5.0 5.0 - 8.0   Glucose, UA NEGATIVE NEGATIVE mg/dL   Hgb urine dipstick LARGE (A) NEGATIVE   Bilirubin Urine NEGATIVE NEGATIVE   Ketones, ur NEGATIVE NEGATIVE mg/dL   Protein, ur NEGATIVE NEGATIVE mg/dL   Nitrite NEGATIVE NEGATIVE   Leukocytes,Ua NEGATIVE NEGATIVE   RBC / HPF >50 (H) 0 - 5 RBC/hpf   WBC, UA 0-5 0 - 5 WBC/hpf   Bacteria, UA RARE (A) NONE SEEN   Squamous Epithelial / LPF 0-5 0 - 5   Mucus PRESENT     Comment: Performed at [facility], [address], [place], [place] [address]  Lactic acid, plasma     Status: None   Collection Time: 11/19/18  2:39 AM  Result Value Ref Range   Lactic Acid, Venous 1.4 0.5 - 1.9 mmol/L    Comment: Performed at [facility] [address], [place], [place] [address]   Dg Chest 2 View  Result Date: 11/19/2018 CLINICAL DATA:  Sepsis EXAM: CHEST - 2 VIEW COMPARISON:  November 15, 2018 FINDINGS: There is no large infiltrate. The heart size is stable. There is no pneumothorax. No acute osseous abnormality. The lungs are essentially clear. The previously noted left basilar airspace opacity is no longer well visualized. IMPRESSION: No active cardiopulmonary disease. Electronically Signed   By: Constance Holster M.D.   On: 11/19/2018 01:30   Ct Head Wo Contrast  Result Date: 11/19/2018 CLINICAL DATA:  Altered level of consciousness. EXAM: CT HEAD WITHOUT CONTRAST TECHNIQUE: Contiguous axial images were obtained from the base of the skull through the vertex without intravenous contrast. COMPARISON:  None. FINDINGS: Brain: No evidence of acute infarction, hemorrhage, hydrocephalus, extra-axial collection or mass lesion/mass effect. Volume loss is noted. Chronic microvascular ischemic  changes are noted. Evaluation was somewhat limited  by motion artifact. Vascular: No hyperdense vessel or unexpected calcification. Skull: Normal. Negative for fracture or focal lesion. Sinuses/Orbits: No acute finding. Other: None. IMPRESSION: 1. No acute intracranial abnormality. 2. Volume loss and chronic microvascular ischemic changes are noted. Electronically Signed   By: Constance Holster M.D.   On: 11/19/2018 03:51    Pending Labs Unresulted Labs (From admission, onward)    Start     Ordered   11/19/18 0039  Culture, blood (Routine x 2)  BLOOD CULTURE X 2,   STAT     11/19/18 0038   Signed and Held  CBC  Tomorrow morning,   R     Signed and Held   Signed and Held  CBC  (enoxaparin (LOVENOX)    CrCl >/= 30 ml/min)  Once,   R    Comments: Baseline for enoxaparin therapy IF NOT ALREADY DRAWN.  Notify MD if PLT < 100 K.    Signed and Held   Signed and Held  Creatinine, serum  (enoxaparin (LOVENOX)    CrCl >/= 30 ml/min)  Once,   R    Comments: Baseline for enoxaparin therapy IF NOT ALREADY DRAWN.    Signed and Held   Signed and Held  Creatinine, serum  (enoxaparin (LOVENOX)    CrCl >/= 30 ml/min)  Weekly,   R    Comments: while on enoxaparin therapy    Signed and Held   Signed and Held  Basic metabolic panel  Now then every 4 hours,   R     Signed and Held          Vitals/Pain Today's Vitals   11/19/18 0245 11/19/18 0415 11/19/18 0518 11/19/18 0551  BP: 111/66 (!) 141/130 (!) 110/59   Pulse: (!) 103 (!) 108 (!) 104 99  Resp: 19 20 (!) 22 18  Temp:      TempSrc:      SpO2: 100% 98% 100% 100%  Weight:      Height:        Isolation Precautions No active isolations  Medications Medications  sodium chloride 0.9 % bolus 1,000 mL (1,000 mLs Intravenous Not Given 11/19/18 0216)    And  sodium chloride 0.9 % bolus 1,000 mL (0 mLs Intravenous Stopped 11/19/18 0551)    And  sodium chloride 0.9 % bolus 250 mL (250 mLs Intravenous New Bag/Given 11/19/18 0550)  dextrose 5 %  solution (has no administration in time range)  ceFEPIme (MAXIPIME) 1 g in sodium chloride 0.9 % 100 mL IVPB (has no administration in time range)  vancomycin (VANCOCIN) 500 mg in sodium chloride 0.9 % 100 mL IVPB (has no administration in time range)  sodium chloride flush (NS) 0.9 % injection 3 mL (3 mLs Intravenous Given 11/19/18 0217)  sodium chloride 0.9 % bolus 1,000 mL (0 mLs Intravenous Stopped 11/19/18 0332)  ceFEPIme (MAXIPIME) 2 g in sodium chloride 0.9 % 100 mL IVPB (0 g Intravenous Stopped 11/19/18 0428)  metroNIDAZOLE (FLAGYL) IVPB 500 mg (0 mg Intravenous Stopped 11/19/18 0332)  vancomycin (VANCOCIN) IVPB 1000 mg/200 mL premix (0 mg Intravenous Stopped 11/19/18 0551)  LORazepam (ATIVAN) injection 0.25 mg (0.25 mg Intravenous Given 11/19/18 0457)    Mobility non-ambulatory High fall risk   Focused Assessments -   R Recommendations: See Admitting Provider Note  Report given to:   Additional Notes:   HX memory loss and worsening cognitive function over past 6 months.  GCS 9: eye opening to speech, incomprehensible speech, and withdraws from pain.  Recent fall requiring surgery: left arthroplasty. Pt. Presents with ecchymosis of left buttocks and left back. Pt also presents with skin tear of right wrist.   During ED stay pt. Begin to become agitated. Pt was moaning and ripped out left AC IV. Pt. Also injured penis with blood present (reassessed pt. No blood at this time) Ativan ordered, safety mittens in place, and safety sitter ordered.

## 2018-11-19 NOTE — ED Notes (Signed)
Pt. Found gripping private area. Area red and bleeding. Pt. Moaning. Pt. agitated while nurse and NT attempted to clean pt. Pt. Pulled out L AC IV. Cyndia Diver Paged.

## 2018-11-19 NOTE — ED Notes (Signed)
ED TO INPATIENT HANDOFF REPORT  ED Nurse Name and Phone #: Celene Squibb RN  S Name/Age/Gender Anthony Hamilton 85 y.o. male Room/Bed: 035C/035C  Code Status   Code Status: DNR  Home/SNF/Other Nursing Home Patient oriented to: self Is this baseline? Yes   Triage Complete: Triage complete  Chief Complaint Posiible Sepsis, Altered Mental Status  Triage Note Pt BIB EMS from [facility] d/t possible code sepsis. Pt. Had recent fall. On 11/09/2018 pt. Had surgery to repair left femur fracture and hip. Pt. Stopped talking/eating yesterday.  Pt oriented to person. Pt. incomprehensible speech.  Information gathered from wife via phone.    Allergies Allergies  Allergen Reactions  . Ibuprofen Other (See Comments)    unknown    Level of Care/Admitting Diagnosis ED Disposition    ED Disposition Condition Comment   Admit  Hospital Area: [facility] [[id number]]  Level of Care: Progressive [102]  Covid Evaluation: Screening Protocol (No Symptoms)  Diagnosis: ARF (acute renal failure) [facility]) [[id number]]  Admitting Physician: [name] [phone]  Attending Physician: [name] [phone]  Estimated length of stay: past midnight tomorrow  Certification:: I certify this patient will need inpatient services for at least 2 midnights  PT Class (Do Not Modify): Inpatient [101]  PT Acc Code (Do Not Modify): Private [1]       B Medical/Surgery History History reviewed. No pertinent past medical history. Past Surgical History:  Procedure Laterality Date  . HIP ARTHROPLASTY Left 11/09/2018   Procedure: ARTHROPLASTY BIPOLAR HIP (HEMIARTHROPLASTY);  Surgeon: Hiram Gash, MD;  Location: [facility];  Service: Orthopedics;  Laterality: Left;     A IV Location/Drains/Wounds Patient Lines/Drains/Airways Status   Active Line/Drains/Airways    Name:   Placement date:   Placement time:   Site:   Days:   Peripheral IV 11/19/18 Right Antecubital   11/19/18    1200     Antecubital   less than 1   External Urinary Catheter   11/19/18    1130    -   less than 1   Incision (Closed) 11/09/18 Hip Left   11/09/18    0934     10          Intake/Output Last 24 hours No intake or output data in the 24 hours ending 11/19/18 1512  Labs/Imaging Results for orders placed or performed during the hospital encounter of 11/18/18 (from the past 48 hour(s))  Comprehensive metabolic panel     Status: Abnormal   Collection Time: 11/19/18 12:07 AM  Result Value Ref Range   Sodium 162 (HH) 135 - 145 mmol/L    Comment: CRITICAL RESULT CALLED TO, READ BACK BY AND VERIFIED WITH: S.LOWDERMILK,RN 0157 11/19/18 G.MCADOO    Potassium 3.9 3.5 - 5.1 mmol/L   Chloride 124 (H) 98 - 111 mmol/L   CO2 22 22 - 32 mmol/L   Glucose, Bld 150 (H) 70 - 99 mg/dL   BUN 125 (H) 8 - 23 mg/dL   Creatinine, Ser 4.79 (H) 0.61 - 1.24 mg/dL   Calcium 8.6 (L) 8.9 - 10.3 mg/dL   Total Protein 5.6 (L) 6.5 - 8.1 g/dL   Albumin 3.0 (L) 3.5 - 5.0 g/dL   AST 24 15 - 41 U/L   ALT 30 0 - 44 U/L   Alkaline Phosphatase 57 38 - 126 U/L   Total Bilirubin 1.4 (H) 0.3 - 1.2 mg/dL   GFR calc non Af Amer 10 (L) >60 mL/min   GFR  calc Af Amer 12 (L) >60 mL/min   Anion gap 16 (H) 5 - 15    Comment: Performed at [facility], [place] [address], [place], [place] [address]  Lactic acid, plasma     Status: Abnormal   Collection Time: 11/19/18 12:07 AM  Result Value Ref Range   Lactic Acid, Venous 2.1 (HH) 0.5 - 1.9 mmol/L    Comment: CRITICAL RESULT CALLED TO, READ BACK BY AND VERIFIED WITH: S.LOWDERMILK,RN 0157 11/19/18 G.MCADOO Performed at [facility], [place] [address], [place], [place] [address]   CBC with Differential     Status: Abnormal   Collection Time: 11/19/18 12:07 AM  Result Value Ref Range   WBC 30.4 (H) 4.0 - 10.5 K/uL    Comment: REPEATED TO VERIFY   RBC 4.10 (L) 4.22 - 5.81 MIL/uL   Hemoglobin 12.2 (L) 13.0 - 17.0 g/dL   HCT 39.6 39.0 - 52.0 %   MCV 96.6 80.0 - 100.0 fL   MCH  29.8 26.0 - 34.0 pg   MCHC 30.8 30.0 - 36.0 g/dL   RDW 13.9 11.5 - 15.5 %   Platelets 318 150 - 400 K/uL   nRBC 0.1 0.0 - 0.2 %   Neutrophils Relative % 93 %   Neutro Abs 28.0 (H) 1.7 - 7.7 K/uL   Lymphocytes Relative 3 %   Lymphs Abs 0.8 0.7 - 4.0 K/uL   Monocytes Relative 3 %   Monocytes Absolute 1.0 0.1 - 1.0 K/uL   Eosinophils Relative 0 %   Eosinophils Absolute 0.0 0.0 - 0.5 K/uL   Basophils Relative 0 %   Basophils Absolute 0.1 0.0 - 0.1 K/uL   Immature Granulocytes 1 %   Abs Immature Granulocytes 0.38 (H) 0.00 - 0.07 K/uL    Comment: Performed at [facility] [address] [place], [place] [address]  Protime-INR     Status: Abnormal   Collection Time: 11/19/18 12:07 AM  Result Value Ref Range   Prothrombin Time 15.8 (H) 11.4 - 15.2 seconds   INR 1.3 (H) 0.8 - 1.2    Comment: (NOTE) INR goal varies based on device and disease states. Performed at [facility], [place] [address], [place], [place] [address]   SARS Coronavirus 2 (CEPHEID - Performed in [facility] hospital lab), Hosp Order     Status: None   Collection Time: 11/19/18  1:13 AM   Specimen: Nasopharyngeal Swab  Result Value Ref Range   SARS Coronavirus 2 NEGATIVE NEGATIVE    Comment: (NOTE) If result is NEGATIVE SARS-CoV-2 target nucleic acids are NOT DETECTED. The SARS-CoV-2 RNA is generally detectable in upper and lower  respiratory specimens during the acute phase of infection. The lowest  concentration of SARS-CoV-2 viral copies this assay can detect is 250  copies / mL. A negative result does not preclude SARS-CoV-2 infection  and should not be used as the sole basis for treatment or other  patient management decisions.  A negative result may occur with  improper specimen collection / handling, submission of specimen other  than nasopharyngeal swab, presence of viral mutation(s) within the  areas targeted by this assay, and inadequate number of viral copies  (<250 copies / mL). A negative  result must be combined with clinical  observations, patient history, and epidemiological information. If result is POSITIVE SARS-CoV-2 target nucleic acids are DETECTED. The SARS-CoV-2 RNA is generally detectable in upper and lower  respiratory specimens dur ing the acute phase  of infection.  Positive  results are indicative of active infection with SARS-CoV-2.  Clinical  correlation with patient history and other diagnostic information is  necessary to determine patient infection status.  Positive results do  not rule out bacterial infection or co-infection with other viruses. If result is PRESUMPTIVE POSTIVE SARS-CoV-2 nucleic acids MAY BE PRESENT.   A presumptive positive result was obtained on the submitted specimen  and confirmed on repeat testing.  While 2019 novel coronavirus  (SARS-CoV-2) nucleic acids may be present in the submitted sample  additional confirmatory testing may be necessary for epidemiological  and / or clinical management purposes  to differentiate between  SARS-CoV-2 and other Sarbecovirus currently known to infect humans.  If clinically indicated additional testing with an alternate test  methodology [phone]) is advised. The SARS-CoV-2 RNA is generally  detectable in upper and lower respiratory sp ecimens during the acute  phase of infection. The expected result is Negative. Fact Sheet for Patients:  StrictlyIdeas.no Fact Sheet for Healthcare Providers: BankingDealers.co.za This test is not yet approved or cleared by the [place] FDA and has been authorized for detection and/or diagnosis of SARS-CoV-2 by FDA under an Emergency Use Authorization (EUA).  This EUA will remain in effect (meaning this test can be used) for the duration of the COVID-19 declaration under Section 564(b)(1) of the Act, 21 U.S.C. section 360bbb-3(b)(1), unless the authorization is terminated or revoked sooner. Performed at [facility], [place] [address], [place], [place] [address]   Urinalysis, Routine w reflex microscopic     Status: Abnormal   Collection Time: 11/19/18  1:42 AM  Result Value Ref Range   Color, Urine YELLOW YELLOW   APPearance CLOUDY (A) CLEAR   Specific Gravity, Urine 1.016 1.005 - 1.030   pH 5.0 5.0 - 8.0   Glucose, UA NEGATIVE NEGATIVE mg/dL   Hgb urine dipstick LARGE (A) NEGATIVE   Bilirubin Urine NEGATIVE NEGATIVE   Ketones, ur NEGATIVE NEGATIVE mg/dL   Protein, ur NEGATIVE NEGATIVE mg/dL   Nitrite NEGATIVE NEGATIVE   Leukocytes,Ua NEGATIVE NEGATIVE   RBC / HPF >50 (H) 0 - 5 RBC/hpf   WBC, UA 0-5 0 - 5 WBC/hpf   Bacteria, UA RARE (A) NONE SEEN   Squamous Epithelial / LPF 0-5 0 - 5   Mucus PRESENT     Comment: Performed at [facility], [address] [place], [place] [address]  Sodium, urine, random     Status: None   Collection Time: 11/19/18  1:42 AM  Result Value Ref Range   Sodium, Ur 11 mmol/L    Comment: Performed at [facility] [address], [place], [place] [address]  Osmolality, urine     Status: None   Collection Time: 11/19/18  1:42 AM  Result Value Ref Range   Osmolality, Ur 490 300 - 900 mOsm/kg    Comment: Performed at [facility] [address], [place], [place] [address]  Lactic acid, plasma     Status: None   Collection Time: 11/19/18  2:39 AM  Result Value Ref Range   Lactic Acid, Venous 1.4 0.5 - 1.9 mmol/L    Comment: Performed at [facility] [address], [place],  [address]  Basic metabolic panel     Status: Abnormal   Collection Time: 11/19/18  8:43 AM  Result Value Ref Range   Sodium 164 (HH) 135 - 145 mmol/L    Comment: CRITICAL RESULT CALLED  TO, READ BACK BY AND VERIFIED WITH: M.Gerilyn Stargell,RN 0929 11/19/2018 CLARK,S    Potassium 5.0 3.5 - 5.1 mmol/L   Chloride 129 (H) 98 - 111 mmol/L   CO2 21 (L) 22 - 32 mmol/L   Glucose, Bld 180 (H) 70 - 99 mg/dL   BUN 122 (H) 8 - 23 mg/dL   Creatinine, Ser 4.98 (H)  0.61 - 1.24 mg/dL   Calcium 7.9 (L) 8.9 - 10.3 mg/dL   GFR calc non Af Amer 10 (L) >60 mL/min   GFR calc Af Amer 11 (L) >60 mL/min   Anion gap 14 5 - 15    Comment: Performed at [facility] [address] [place], [place] [address]  CBC     Status: Abnormal   Collection Time: 11/19/18  8:43 AM  Result Value Ref Range   WBC 29.7 (H) 4.0 - 10.5 K/uL   RBC 3.64 (L) 4.22 - 5.81 MIL/uL   Hemoglobin 10.9 (L) 13.0 - 17.0 g/dL   HCT 35.7 (L) 39.0 - 52.0 %   MCV 98.1 80.0 - 100.0 fL   MCH 29.9 26.0 - 34.0 pg   MCHC 30.5 30.0 - 36.0 g/dL   RDW 13.7 11.5 - 15.5 %   Platelets 259 150 - 400 K/uL   nRBC 0.0 0.0 - 0.2 %    Comment: Performed at [facility], [place] [address], [place], [place] [address]  CBG monitoring, ED     Status: Abnormal   Collection Time: 11/19/18 11:21 AM  Result Value Ref Range   Glucose-Capillary 154 (H) 70 - 99 mg/dL  Basic metabolic panel     Status: Abnormal   Collection Time: 11/19/18 12:17 PM  Result Value Ref Range   Sodium 161 (HH) 135 - 145 mmol/L    Comment: CRITICAL RESULT CALLED TO, READ BACK BY AND VERIFIED WITH: M.Darbie Biancardi,RN 1318 11/19/2018 CLARK,S    Potassium 3.7 3.5 - 5.1 mmol/L   Chloride 129 (H) 98 - 111 mmol/L   CO2 21 (L) 22 - 32 mmol/L   Glucose, Bld 173 (H) 70 - 99 mg/dL   BUN 122 (H) 8 - 23 mg/dL   Creatinine, Ser 4.49 (H) 0.61 - 1.24 mg/dL   Calcium 7.8 (L) 8.9 - 10.3 mg/dL   GFR calc non Af Amer 11 (L) >60 mL/min   GFR calc Af Amer 13 (L) >60 mL/min   Anion gap 11 5 - 15    Comment: Performed at [facility] [address], [place], [place] [address]  Hemoglobin A1c     Status: None   Collection Time: 11/19/18 12:17 PM  Result Value Ref Range   Hgb A1c MFr Bld 5.1 4.8 - 5.6 %    Comment: (NOTE) Pre diabetes:          5.7%-6.4% Diabetes:              >6.4% Glycemic control for   <7.0% adults with diabetes    Mean Plasma Glucose 99.67 mg/dL    Comment: Performed at [facility] [address],  [place], [place] [address]   Dg Chest 2 View  Result Date: 11/19/2018 CLINICAL DATA:  Sepsis EXAM: CHEST - 2 VIEW COMPARISON:  November 15, 2018 FINDINGS: There is no large infiltrate. The heart size is stable. There is no pneumothorax. No acute osseous abnormality. The lungs are essentially clear. The previously noted left basilar airspace opacity is no longer well visualized. IMPRESSION: No active cardiopulmonary disease.  Electronically Signed   By: Constance Holster M.D.   On: 11/19/2018 01:30   Ct Head Wo Contrast  Result Date: 11/19/2018 CLINICAL DATA:  Altered level of consciousness. EXAM: CT HEAD WITHOUT CONTRAST TECHNIQUE: Contiguous axial images were obtained from the base of the skull through the vertex without intravenous contrast. COMPARISON:  None. FINDINGS: Brain: No evidence of acute infarction, hemorrhage, hydrocephalus, extra-axial collection or mass lesion/mass effect. Volume loss is noted. Chronic microvascular ischemic changes are noted. Evaluation was somewhat limited by motion artifact. Vascular: No hyperdense vessel or unexpected calcification. Skull: Normal. Negative for fracture or focal lesion. Sinuses/Orbits: No acute finding. Other: None. IMPRESSION: 1. No acute intracranial abnormality. 2. Volume loss and chronic microvascular ischemic changes are noted. Electronically Signed   By: Constance Holster M.D.   On: 11/19/2018 03:51   Ct Renal Stone Study  Result Date: 11/19/2018 CLINICAL DATA:  Hematuria with unknown cause EXAM: CT ABDOMEN AND PELVIS WITHOUT CONTRAST TECHNIQUE: Multidetector CT imaging of the abdomen and pelvis was performed following the standard protocol without IV contrast. COMPARISON:  05/15/2007 FINDINGS: Lower chest:  Mild dependent atelectasis.  Coronary calcification Hepatobiliary: No focal liver abnormality.Cholecystectomy which likely accounts for prominent bile duct dimensions. Pancreas: Generalized fatty atrophy. 2 cm cystic density along the upper pancreas  that is new from prior. Spleen: Unremarkable. Adrenals/Urinary Tract: Small bilateral nodules that are stable and attributed adenoma . Multiple bilateral renal cysts including a lobulated posterior interpolar cyst on the right with thin benign-appearing septations that are stable. A large right lower pole cyst measures 11 cm. Accounting for hilar cysts there is no hydronephrosis. Degree of bilateral renal atrophy. Distended bladder with diverticula at the apex. Stomach/Bowel:  No obstruction. No evidence of bowel inflammation. Vascular/Lymphatic: Atherosclerotic calcification. Mesenteric stranding in 2008 has resolved, now with small calcified nodes in this area. Reproductive:Marked thickening of the prostate. Other: Left inguinal hernia containing non thickened and nonobstructed sigmoid colon Musculoskeletal: Fluid and bubble of gas about the left hip arthroplasty correlating with recent placement. No acute osseous finding IMPRESSION: 1. No acute finding or specific explanation for hematuria. 2. Multiple renal cysts with benign mural calcification on the right. 3. Marked enlargement of the prostate with bladder wall thickening and diverticula attributed to chronic outlet obstruction. 4. 2 cm cystic density nodule along the upper pancreas that is new from 2008. MR follow-up could be obtained if appropriate for life expectancy. 5. Left inguinal hernia containing sigmoid colon. Electronically Signed   By: Monte Fantasia M.D.   On: 11/19/2018 06:09    Pending Labs Unresulted Labs (From admission, onward)    Start     Ordered   11/19/18 1417  Creatinine, urine, random  Add-on,   AD     11/19/18 1416   11/19/18 9163  Basic metabolic panel  Now then every 4 hours,   R (with STAT occurrences)     11/19/18 1019   11/19/18 0039  Culture, blood (Routine x 2)  BLOOD CULTURE X 2,   STAT     11/19/18 0038   Signed and Held  CBC  Tomorrow morning,   R     Signed and Held   Signed and Held  CBC  (enoxaparin  (LOVENOX)    CrCl >/= 30 ml/min)  Once,   R    Comments: Baseline for enoxaparin therapy IF NOT ALREADY DRAWN.  Notify MD if PLT < 100 K.    Signed and Held   Signed and Held  Creatinine, serum  (  enoxaparin (LOVENOX)    CrCl >/= 30 ml/min)  Once,   R    Comments: Baseline for enoxaparin therapy IF NOT ALREADY DRAWN.    Signed and Held   Signed and Held  Creatinine, serum  (enoxaparin (LOVENOX)    CrCl >/= 30 ml/min)  Weekly,   R    Comments: while on enoxaparin therapy    Signed and Held   Signed and Held  Basic metabolic panel  Now then every 4 hours,   R     Signed and Held          Vitals/Pain Today's Vitals   11/19/18 1000 11/19/18 1030 11/19/18 1100 11/19/18 1130  BP: 121/65 (!) 112/93 135/62 126/60  Pulse: (!) 102 (!) 126 (!) 106 73  Resp: 15  20 19   Temp:      TempSrc:      SpO2: 100% 91% 99% 96%  Weight:      Height:        Isolation Precautions No active isolations  Medications Medications  sodium chloride 0.9 % bolus 1,000 mL (1,000 mLs Intravenous Not Given 11/19/18 0216)    And  sodium chloride 0.9 % bolus 1,000 mL (0 mLs Intravenous Stopped 11/19/18 0551)    And  sodium chloride 0.9 % bolus 250 mL (0 mLs Intravenous Stopped 11/19/18 0613)  ceFEPIme (MAXIPIME) 1 g in sodium chloride 0.9 % 100 mL IVPB (has no administration in time range)  vancomycin (VANCOCIN) 500 mg in sodium chloride 0.9 % 100 mL IVPB (has no administration in time range)  dextrose 5 % solution (has no administration in time range)  insulin aspart (novoLOG) injection 0-9 Units (2 Units Subcutaneous Given 11/19/18 1159)  sodium chloride flush (NS) 0.9 % injection 3 mL (3 mLs Intravenous Given 11/19/18 0217)  sodium chloride 0.9 % bolus 1,000 mL (0 mLs Intravenous Stopped 11/19/18 0332)  ceFEPIme (MAXIPIME) 2 g in sodium chloride 0.9 % 100 mL IVPB (0 g Intravenous Stopped 11/19/18 0428)  metroNIDAZOLE (FLAGYL) IVPB 500 mg (0 mg Intravenous Stopped 11/19/18 0332)  vancomycin (VANCOCIN) IVPB 1000  mg/200 mL premix (0 mg Intravenous Stopped 11/19/18 0551)  LORazepam (ATIVAN) injection 0.25 mg (0.25 mg Intravenous Given 11/19/18 0457)    Mobility non-ambulatory High fall risk   Focused Assessments Cardiac Assessment Handoff:    No results found for: CKTOTAL, CKMB, CKMBINDEX, TROPONINI No results found for: DDIMER Does the Patient currently have chest pain? No      R Recommendations: See Admitting Provider Note  Report given to:   Additional Notes:

## 2018-11-19 NOTE — ED Notes (Signed)
Patient transported to X-ray 

## 2018-11-19 NOTE — ED Notes (Addendum)
Admitting at bedside. Per Dr. Hal Hope to start D5 after completion of normal saline bolus

## 2018-11-19 NOTE — ED Notes (Addendum)
Pt continues to pull at remaining IV line and private area. Pt. Moaning at this time. Pt. Attempting to get out of bed. Dr. Hal Hope informed.  Safety sitter order in place.

## 2018-11-19 NOTE — ED Notes (Signed)
Attempt to call report x 1  

## 2018-11-19 NOTE — Progress Notes (Signed)
CSW spoke with [place] at [facility] to determine if patient could return to the facility when ready and [place] informed CSW that the facility is unable to meet his needs due to him needing one on one care.  CSW attempted to reach patient's wife at [phone] without success so a voicemail was left requesting a return call.  Madilyn Fireman, MSW, LCSW-A Clinical Social Worker [facility] [phone]

## 2018-11-20 DIAGNOSIS — Z515 Encounter for palliative care: Secondary | ICD-10-CM

## 2018-11-20 DIAGNOSIS — N170 Acute kidney failure with tubular necrosis: Secondary | ICD-10-CM

## 2018-11-20 LAB — GLUCOSE, CAPILLARY
Glucose-Capillary: 106 mg/dL — ABNORMAL HIGH (ref 70–99)
Glucose-Capillary: 107 mg/dL — ABNORMAL HIGH (ref 70–99)
Glucose-Capillary: 121 mg/dL — ABNORMAL HIGH (ref 70–99)
Glucose-Capillary: 130 mg/dL — ABNORMAL HIGH (ref 70–99)
Glucose-Capillary: 139 mg/dL — ABNORMAL HIGH (ref 70–99)
Glucose-Capillary: 167 mg/dL — ABNORMAL HIGH (ref 70–99)

## 2018-11-20 LAB — CBC
HCT: 33.7 % — ABNORMAL LOW (ref 39.0–52.0)
Hemoglobin: 10.2 g/dL — ABNORMAL LOW (ref 13.0–17.0)
MCH: 29.1 pg (ref 26.0–34.0)
MCHC: 30.3 g/dL (ref 30.0–36.0)
MCV: 96.3 fL (ref 80.0–100.0)
Platelets: 229 10*3/uL (ref 150–400)
RBC: 3.5 MIL/uL — ABNORMAL LOW (ref 4.22–5.81)
RDW: 14 % (ref 11.5–15.5)
WBC: 19 10*3/uL — ABNORMAL HIGH (ref 4.0–10.5)
nRBC: 0 % (ref 0.0–0.2)

## 2018-11-20 LAB — BASIC METABOLIC PANEL
Anion gap: 9 (ref 5–15)
BUN: 111 mg/dL — ABNORMAL HIGH (ref 8–23)
BUN: 105 mg/dL — ABNORMAL HIGH (ref 8–23)
CO2: 21 mmol/L — ABNORMAL LOW (ref 22–32)
CO2: 18 mmol/L — ABNORMAL LOW (ref 22–32)
Calcium: 7.9 mg/dL — ABNORMAL LOW (ref 8.9–10.3)
Calcium: 7.9 mg/dL — ABNORMAL LOW (ref 8.9–10.3)
Chloride: 130 mmol/L — ABNORMAL HIGH (ref 98–111)
Chloride: >130 mmol/L (ref 98–111)
Creatinine, Ser: 4.04 mg/dL — ABNORMAL HIGH (ref 0.61–1.24)
Creatinine, Ser: 3.76 mg/dL — ABNORMAL HIGH (ref 0.61–1.24)
GFR calc Af Amer: 14 mL/min — ABNORMAL LOW (ref >60)
GFR calc Af Amer: 16 mL/min — ABNORMAL LOW (ref >60)
GFR calc non Af Amer: 12 mL/min — ABNORMAL LOW (ref >60)
GFR calc non Af Amer: 13 mL/min — ABNORMAL LOW (ref >60)
Glucose, Bld: 114 mg/dL — ABNORMAL HIGH (ref 70–99)
Glucose, Bld: 181 mg/dL — ABNORMAL HIGH (ref 70–99)
Potassium: 3.7 mmol/L (ref 3.5–5.1)
Potassium: 3.7 mmol/L (ref 3.5–5.1)
Sodium: 160 mmol/L — ABNORMAL HIGH (ref 135–145)
Sodium: 160 mmol/L — ABNORMAL HIGH (ref 135–145)

## 2018-11-20 MED ORDER — DEXTROSE 5 % IV SOLN
INTRAVENOUS | Status: AC
  Administered 2018-11-20: 1000 mL via INTRAVENOUS
  Administered 2018-11-21 (×3): via INTRAVENOUS

## 2018-11-20 MED ORDER — MORPHINE SULFATE (PF) 2 MG/ML IV SOLN
1.0000 mg | INTRAVENOUS | Status: DC | PRN
  Administered 2018-11-20 – 2018-11-21 (×2): 1 mg via INTRAVENOUS
  Filled 2018-11-20 (×2): qty 1

## 2018-11-20 NOTE — Progress Notes (Signed)
Initial Nutrition Assessment  DOCUMENTATION CODES:   Not applicable  INTERVENTION:  Monitor for comfort feedings per SLP recs;  NPO at this time  Recommend Prostat BID at diet advancement; each supplement provides 100 kcal and 15 grams of protein (patient with stage 1 pressure ulcer to left heel)  NUTRITION DIAGNOSIS:   Inadequate oral intake related to inability to eat as evidenced by NPO status.   GOAL:   Patient will meet greater than or equal to 90% of their needs   MONITOR:   Labs, I & O's, Diet advancement, Skin  REASON FOR ASSESSMENT:   Low Braden    ASSESSMENT:  85 year old male brought to ED from rehab facility s/p 6/13 hip fracture with increased confusion and poor appetite. PMH of dementia, COPD, CHF,CKD3, HTN,  Patient admitted with AMS and resting at time of visit.  Patient with recent [facility] d/c to rehab facility s/p left hemi arthroplasty on 6/14. Patient seen by SLP at that time and recommended puree/nectar thick. Per note, PO intake has been minimal at SNF  Per chart review, pt will be unable to return to SNF d/t needs of one on one care, Palliative has been consulted.  6/25 SLP: Dyshagia, unspecified Aspiration Risk  Severe aspiration risk;Risk for inadequate nutrition/hydration    Diet Recommendation Other (Comment)(comfort feeding with known risk)     Medications reviewed and include: SSI D5 @ 154mL/hr providing 510 kcals daily  Labs: Na 160 (H) - addressing Glucose 114 BUN 111 Cr 4.04   NUTRITION - FOCUSED PHYSICAL EXAM:  Unable to access at this time  Diet Order:   Diet Order            Diet NPO time specified  Diet effective now              EDUCATION NEEDS:   Not appropriate for education at this time  Skin:  Skin Assessment: Skin Integrity Issues: Skin Integrity Issues:: Stage I Stage I: left heel Incisions: closed; left hip  Last BM:  6/24; type 4 (brown, small)  Height:   Ht Readings from Last 1 Encounters:   11/19/18 5\' 7"  (1.702 m)    Weight:   Wt Readings from Last 1 Encounters:  11/20/18 64.2 kg    Ideal Body Weight:  67.3 kg  BMI:  Body mass index is 22.17 kg/m.  Estimated Nutritional Needs:   Kcal:  1300-1500  Protein:  70-85 grams  Fluid:  >/=1.5L   Lajuan Lines, RD, LDN  After Hours/Weekend Pager: [phone]

## 2018-11-20 NOTE — Progress Notes (Addendum)
PROGRESS NOTE    Anthony Hamilton  ERD:[id number] DOB: [birth year] DOA: 11/18/2018 PCP: Seward Carol, MD   Brief Narrative: Anthony Hamilton is a 85 y.o. male with a history of dementia, hip fracture. Patient presented secondary to confusion and found to have hypernatremia, hematuria, leukocytosis. Started on D5 water in addition to empiric antibiotics.   Assessment & Plan:   Principal Problem:   ARF (acute renal failure) (HCC) Active Problems:   Essential hypertension   Hypernatremia   Acute encephalopathy   Acute metabolic encephalopathy Likely secondary to hypernatremia in addition to uremia. Question of associated infection. Still significantly encephalopathy -Treat below problems  Hypernatremia Sodium of 162 on admission. Started on D5W and transitioned to D5 1/2NS per nephrology. Labs pending today -Nephrology recommendations for IV fluids  Acute kidney injury on CKD stage III Baseline creatinine of 1.7. Creatinine of 4.79 on admission with peak of 4.98. Decreased UOP. Likely secondary to severe dehydration. Associated elevated BUN with likely uremia. BUN still elevated with IV fluids. Trabeculated bladder seen on imaging. Labs pending today. -Nephrology recommendations -Renal ultrasound -Strict in/out  Leukocytosis Significant. Met sepsis criteria on admission. Baseline WBC of 13.4 k from 5 days prior. WBC of 30.4 k on admission. Afebrile. Blood cultures obtained. CXR clear, urinalysis does not suggest infection. Labs pending today -Will hold antibiotics and watch fever curve and trend WBC -Blood cultures pending  Hematuria CT renal study negative for nephrolithiasis. Urinalysis not suggestive of infection other than significant hematuria. -CBC daily -Watch UOP  History of left hip fracture Patient is on Lovenox as an outpatient. Surgery was on 11/09/2018. Recommendation at that time for 30 days of anticoagulation. Labs pending today. -Continue to hold  Lovenox for now pending resolution of hematuria and stabilization of hemoglobin  ?Dementia Patient is on Seroquel.   DVT prophylaxis: SCDs Code Status:   Code Status: DNR Family Communication: None at bedside Disposition Plan: Discharge back to SNF when mental status back to baseline   Consultants:   Nephrology  Procedures:   None  Antimicrobials:  Cefepime (6/24  Vancomycin (6/24  Metronidazole (6/24   Subjective: Encephalopathic. Unable to communicate with me.  Objective: Vitals:   11/20/18 0000 11/20/18 0400 11/20/18 0511 11/20/18 0827  BP: 115/65 122/60  106/73  Pulse:  80  84  Resp:  18  18  Temp:    98.4 F (36.9 C)  TempSrc:    Oral  SpO2:  100%  100%  Weight:   64.2 kg   Height:        Intake/Output Summary (Last 24 hours) at 11/20/2018 0918 Last data filed at 11/20/2018 0600 Gross per 24 hour  Intake 1909.85 ml  Output 1050 ml  Net 859.85 ml   Filed Weights   11/18/18 2353 11/19/18 1746 11/20/18 0511  Weight: 68 kg 63.6 kg 64.2 kg    Examination:  General exam: Appears calm and slightly agitated, trying to remove mittens. Respiratory system: Clear to auscultation. Respiratory effort normal. Cardiovascular system: S1 & S2 heard, RRR. Gastrointestinal system: Abdomen is nondistended, soft and nontender. No organomegaly or masses felt. Normal bowel sounds heard. Central nervous system: Alert and not oriented. Does not appear to follow commands. Moves all extremities spontaneously. Extremities: No edema. No calf tenderness Skin: No cyanosis. No rashes Psychiatry: Judgement and insight appear impaired.    Data Reviewed: I have personally reviewed following labs and imaging studies  CBC: Recent Labs  Lab 11/14/18 1007 11/16/18 0445 11/19/18 0007 11/19/18 0843  WBC  15.5* 13.4* 30.4* 29.7*  NEUTROABS 13.4* 11.1* 28.0*  --   HGB 12.2* 11.3* 12.2* 10.9*  HCT 37.8* 35.7* 39.6 35.7*  MCV 92.6 92.7 96.6 98.1  PLT 244 272 318 093   Basic  Metabolic Panel: Recent Labs  Lab 11/14/18 1007 11/16/18 0445 11/19/18 0007 11/19/18 0843 11/19/18 1217 11/19/18 1626  NA 151* 155* 162* 164* 161* 160*  K 3.6 3.6 3.9 5.0 3.7 3.7  CL 116* 120* 124* 129* 129* 129*  CO2 _0 21* 21* 20*  GLUCOSE 163* 164* 150* 180* 173* 142*  BUN 59* 59* 125* 122* 122* 118*  CREATININE 1.76* 1.86* 4.79* 4.98* 4.49* 4.46*  CALCIUM 9.2 8.8* 8.6* 7.9* 7.8* 8.0*  MG 2.4 2.5*  --   --   --   --   PHOS 3.1 3.6  --   --   --   --    GFR: Estimated Creatinine Clearance: 10.4 mL/min (A) (by C-G formula based on SCr of 4.46 mg/dL (H)). Liver Function Tests: Recent Labs  Lab 11/14/18 1007 11/16/18 0445 11/19/18 0007  AST  --   --  24  ALT  --   --  30  ALKPHOS  --   --  57  BILITOT  --   --  1.4*  PROT  --   --  5.6*  ALBUMIN 3.3* 3.0* 3.0*   No results for input(s): LIPASE, AMYLASE in the last 168 hours. No results for input(s): AMMONIA in the last 168 hours. Coagulation Profile: Recent Labs  Lab 11/19/18 0007  INR 1.3*   Cardiac Enzymes: No results for input(s): CKTOTAL, CKMB, CKMBINDEX, TROPONINI in the last 168 hours. BNP (last 3 results) No results for input(s): PROBNP in the last 8760 hours. HbA1C: Recent Labs    11/19/18 1217  HGBA1C 5.1   CBG: Recent Labs  Lab 11/19/18 1804 11/19/18 2048 11/20/18 0007 11/20/18 0422 11/20/18 0817  GLUCAP 114* 152* 139* 167* 130*   Lipid Profile: No results for input(s): CHOL, HDL, LDLCALC, TRIG, CHOLHDL, LDLDIRECT in the last 72 hours. Thyroid Function Tests: No results for input(s): TSH, T4TOTAL, FREET4, T3FREE, THYROIDAB in the last 72 hours. Anemia Panel: No results for input(s): VITAMINB12, FOLATE, FERRITIN, TIBC, IRON, RETICCTPCT in the last 72 hours. Sepsis Labs: Recent Labs  Lab 11/19/18 0007 11/19/18 0239  LATICACIDVEN 2.1* 1.4    Recent Results (from the past 240 hour(s))  SARS Coronavirus 2 (CEPHEID - Performed in [place] hospital lab), Hosp Order     Status:  None   Collection Time: 11/19/18  1:13 AM   Specimen: Nasopharyngeal Swab  Result Value Ref Range Status   SARS Coronavirus 2 NEGATIVE NEGATIVE Final    Comment: (NOTE) If result is NEGATIVE SARS-CoV-2 target nucleic acids are NOT DETECTED. The SARS-CoV-2 RNA is generally detectable in upper and lower  respiratory specimens during the acute phase of infection. The lowest  concentration of SARS-CoV-2 viral copies this assay can detect is 250  copies / mL. A negative result does not preclude SARS-CoV-2 infection  and should not be used as the sole basis for treatment or other  patient management decisions.  A negative result may occur with  improper specimen collection / handling, submission of specimen other  than nasopharyngeal swab, presence of viral mutation(s) within the  areas targeted by this assay, and inadequate number of viral copies  (<250 copies / mL). A negative result must be combined with clinical  observations, patient history, and epidemiological information. If  result is POSITIVE SARS-CoV-2 target nucleic acids are DETECTED. The SARS-CoV-2 RNA is generally detectable in upper and lower  respiratory specimens dur ing the acute phase of infection.  Positive  results are indicative of active infection with SARS-CoV-2.  Clinical  correlation with patient history and other diagnostic information is  necessary to determine patient infection status.  Positive results do  not rule out bacterial infection or co-infection with other viruses. If result is PRESUMPTIVE POSTIVE SARS-CoV-2 nucleic acids MAY BE PRESENT.   A presumptive positive result was obtained on the submitted specimen  and confirmed on repeat testing.  While 2019 novel coronavirus  (SARS-CoV-2) nucleic acids may be present in the submitted sample  additional confirmatory testing may be necessary for epidemiological  and / or clinical management purposes  to differentiate between  SARS-CoV-2 and other  Sarbecovirus currently known to infect humans.  If clinically indicated additional testing with an alternate test  methodology [phone]) is advised. The SARS-CoV-2 RNA is generally  detectable in upper and lower respiratory sp ecimens during the acute  phase of infection. The expected result is Negative. Fact Sheet for Patients:  StrictlyIdeas.no Fact Sheet for Healthcare Providers: BankingDealers.co.za This test is not yet approved or cleared by the [place] FDA and has been authorized for detection and/or diagnosis of SARS-CoV-2 by FDA under an Emergency Use Authorization (EUA).  This EUA will remain in effect (meaning this test can be used) for the duration of the COVID-19 declaration under Section 564(b)(1) of the Act, 21 U.S.C. section 360bbb-3(b)(1), unless the authorization is terminated or revoked sooner. Performed at [facility], [place] [address] [place], [place] [address]          Radiology Studies: Dg Chest 2 View  Result Date: 11/19/2018 CLINICAL DATA:  Sepsis EXAM: CHEST - 2 VIEW COMPARISON:  November 15, 2018 FINDINGS: There is no large infiltrate. The heart size is stable. There is no pneumothorax. No acute osseous abnormality. The lungs are essentially clear. The previously noted left basilar airspace opacity is no longer well visualized. IMPRESSION: No active cardiopulmonary disease. Electronically Signed   By: Constance Holster M.D.   On: 11/19/2018 01:30   Ct Head Wo Contrast  Result Date: 11/19/2018 CLINICAL DATA:  Altered level of consciousness. EXAM: CT HEAD WITHOUT CONTRAST TECHNIQUE: Contiguous axial images were obtained from the base of the skull through the vertex without intravenous contrast. COMPARISON:  None. FINDINGS: Brain: No evidence of acute infarction, hemorrhage, hydrocephalus, extra-axial collection or mass lesion/mass effect. Volume loss is noted. Chronic microvascular ischemic changes are  noted. Evaluation was somewhat limited by motion artifact. Vascular: No hyperdense vessel or unexpected calcification. Skull: Normal. Negative for fracture or focal lesion. Sinuses/Orbits: No acute finding. Other: None. IMPRESSION: 1. No acute intracranial abnormality. 2. Volume loss and chronic microvascular ischemic changes are noted. Electronically Signed   By: Constance Holster M.D.   On: 11/19/2018 03:51   US Renal  Result Date: 11/19/2018 CLINICAL DATA:  Acute kidney injury. EXAM: RENAL / URINARY TRACT ULTRASOUND COMPLETE COMPARISON:  Ultrasound of April 18, 2010. CT scan of November 19, 2018. FINDINGS: Right Kidney: Renal measurements: 19.6 x 10.5 x 8.4 cm = volume: None 107 mL. Increased echogenicity of renal parenchyma is noted suggesting medical renal disease. Greater than 10 cysts are noted in the right kidney, with the largest simple cyst measuring 10.7 cm in the lower pole. 7.9 cm cyst is seen in upper pole. No definite hydronephrosis is noted. Left Kidney: Renal measurements:  12.7 x 5.8 x 4.3 cm = volume: 164 mL. Increased echogenicity of renal parenchyma is noted suggesting medical renal disease. Greater than 10 cysts are noted, with the largest measuring 4.2 cm in lower pole. No hydronephrosis is noted. Bladder: Bladder diverticulum is noted. Slightly irregular wall of urinary bladder is noted suggesting trabeculation. Large prostate gland is noted. IMPRESSION: Increased echogenicity of renal parenchyma is noted bilaterally suggesting medical renal disease. Bilateral renal cysts are noted as described above. Bladder diverticulum is noted.  Large prostate gland is noted. Electronically Signed   By: Marijo Conception M.D.   On: 11/19/2018 15:19   Ct Renal Stone Study  Result Date: 11/19/2018 CLINICAL DATA:  Hematuria with unknown cause EXAM: CT ABDOMEN AND PELVIS WITHOUT CONTRAST TECHNIQUE: Multidetector CT imaging of the abdomen and pelvis was performed following the standard protocol without IV  contrast. COMPARISON:  05/15/2007 FINDINGS: Lower chest:  Mild dependent atelectasis.  Coronary calcification Hepatobiliary: No focal liver abnormality.Cholecystectomy which likely accounts for prominent bile duct dimensions. Pancreas: Generalized fatty atrophy. 2 cm cystic density along the upper pancreas that is new from prior. Spleen: Unremarkable. Adrenals/Urinary Tract: Small bilateral nodules that are stable and attributed adenoma . Multiple bilateral renal cysts including a lobulated posterior interpolar cyst on the right with thin benign-appearing septations that are stable. A large right lower pole cyst measures 11 cm. Accounting for hilar cysts there is no hydronephrosis. Degree of bilateral renal atrophy. Distended bladder with diverticula at the apex. Stomach/Bowel:  No obstruction. No evidence of bowel inflammation. Vascular/Lymphatic: Atherosclerotic calcification. Mesenteric stranding in 2008 has resolved, now with small calcified nodes in this area. Reproductive:Marked thickening of the prostate. Other: Left inguinal hernia containing non thickened and nonobstructed sigmoid colon Musculoskeletal: Fluid and bubble of gas about the left hip arthroplasty correlating with recent placement. No acute osseous finding IMPRESSION: 1. No acute finding or specific explanation for hematuria. 2. Multiple renal cysts with benign mural calcification on the right. 3. Marked enlargement of the prostate with bladder wall thickening and diverticula attributed to chronic outlet obstruction. 4. 2 cm cystic density nodule along the upper pancreas that is new from 2008. MR follow-up could be obtained if appropriate for life expectancy. 5. Left inguinal hernia containing sigmoid colon. Electronically Signed   By: Monte Fantasia M.D.   On: 11/19/2018 06:09        Scheduled Meds:  chlorhexidine  15 mL Mouth Rinse BID   insulin aspart  0-9 Units Subcutaneous Q4H   mouth rinse  15 mL Mouth Rinse q12n4p    Continuous Infusions:  ceFEPime (MAXIPIME) IV Stopped (11/19/18 2330)   dextrose 5 % and 0.45% NaCl 125 mL/hr at 11/20/18 0600   [START ON 11/21/2018] vancomycin       LOS: 1 day     Cordelia Poche, MD [facility] 11/20/2018, 9:18 AM  If 7PM-7AM, please contact night-coverage [email or web address]

## 2018-11-20 NOTE — Evaluation (Signed)
Clinical/Bedside Swallow Evaluation Patient Details  Name: Anthony Hamilton MRN: [id number] Date of Birth: 1928/03/18  Today's Date: 11/20/2018 Time: SLP Start Time (ACUTE ONLY): 0945 SLP Stop Time (ACUTE ONLY): 1026 SLP Time Calculation (min) (ACUTE ONLY): 41 min  Past Medical History: History reviewed. No pertinent past medical history. Past Surgical History:  Past Surgical History:  Procedure Laterality Date  . HIP ARTHROPLASTY Left 11/09/2018   Procedure: ARTHROPLASTY BIPOLAR HIP (HEMIARTHROPLASTY);  Surgeon: Hiram Gash, MD;  Location: [place];  Service: Orthopedics;  Laterality: Left;   HPI:  85 y.o. male  with past medical history of cognitive decline and chronic kidney disease as well as a recent hip fracture s/p left hemi arthroplasty (6/14),  who was admitted on 11/18/2018 with confusion and poor PO intake.  Work up revealed acute on chronic kidney failure, hypernatremia and hypotension.  Pt was seen during prior admission by SLP service. MBS showed pharyngeal weakness, residue, poor awareness of bolus. In general deconditioning and AMS severely impacted his abiltiy to take adequate PO. Recommended to consume puree and nectar thick liquids, but in gneral PO intake has been minimal ast SNF and pt is severely dehydrated.    Assessment / Plan / Recommendation Clinical Impression  Pt is arousable but minimally responsive, found to have severe dehydration of oral mucosa, caked food and dried secretions on upper palate, around teeth and gums, coughing intermittently on audible standing pharyngeal secretions. SLP repositioned pt and provided extensive oral care to remove secretions and attempt to hydrate his mucosa. Applying minimal hydration aided pt in making coughing attempts more productive with the assist of suction applied to base of tongue. Still, after these interventions pt was minimally aware of wet spoon, wet cup to lips, no labial seal made. When given an ice chip he did not  orally manipulate it, but a swallow was elicited. Overall, pt has potential to swallow with known risk of aspiration if/when mentation is adequate, but he apparently has not been alert enough for PO for some time. This was also the barrier to adequate hydration when he was admitted initially for hip fx. He appears to be in a great deal of pain and anxiety when more alert. Would support comfort feeding if and when appropriate.  SLP Visit Diagnosis: Dysphagia, unspecified (R13.10)    Aspiration Risk  Severe aspiration risk;Risk for inadequate nutrition/hydration    Diet Recommendation Other (Comment)(comfort feeding with known risk)        Other  Recommendations     Follow up Recommendations 24 hour supervision/assistance      Frequency and Duration            Prognosis        Swallow Study   General HPI: 85 y.o. male  with past medical history of cognitive decline and chronic kidney disease as well as a recent hip fracture s/p left hemi arthroplasty (6/14),  who was admitted on 11/18/2018 with confusion and poor PO intake.  Work up revealed acute on chronic kidney failure, hypernatremia and hypotension.  Pt was seen during prior admission by SLP service. MBS showed pharyngeal weakness, residue, poor awareness of bolus. In general deconditioning and AMS severely impacted his abiltiy to take adequate PO. Recommended to consume puree and nectar thick liquids, but in gneral PO intake has been minimal ast SNF and pt is severely dehydrated.  Type of Study: Bedside Swallow Evaluation Previous Swallow Assessment: see HPI Diet Prior to this Study: NPO Temperature Spikes Noted: No Respiratory Status:  Room air History of Recent Intubation: No Behavior/Cognition: Uncooperative;Confused Oral Cavity Assessment: Dry;Dried secretions Oral Care Completed by SLP: Yes Oral Cavity - Dentition: Adequate natural dentition Vision: Impaired for self-feeding Self-Feeding Abilities: Total assist Patient  Positioning: Postural control interferes with function Baseline Vocal Quality: Low vocal intensity Volitional Cough: Cognitively unable to elicit(congested baseline cough) Volitional Swallow: Unable to elicit    Oral/Motor/Sensory Function Overall Oral Motor/Sensory Function: Generalized oral weakness   Ice Chips Ice chips: Impaired Presentation: Spoon Oral Phase Impairments: Poor awareness of bolus;Impaired mastication;Reduced labial seal;Reduced lingual movement/coordination Pharyngeal Phase Impairments: Cough - Immediate   Thin Liquid Thin Liquid: Not tested(pt could not seal lips to cup or straw)    Nectar Thick Nectar Thick Liquid: Not tested   Honey Thick Honey Thick Liquid: Not tested   Puree Puree: Not tested   Solid     Solid: Not tested     Anthony Baltimore, MA CCC-SLP  Acute Rehabilitation Services Pager [phone] Office [phone]  Carmen Vallecillo, Katherene Ponto 11/20/2018,10:37 AM

## 2018-11-20 NOTE — Consult Note (Signed)
Consultation Note Date: 11/20/2018   Patient Name: Anthony Hamilton  DOB: [birth year]  MRN: [id number]  Age / Sex: 85 y.o., male  PCP: Seward Carol, MD Referring Physician: Mariel Aloe, MD  Reason for Consultation: Establishing goals of care  HPI/Patient Profile: 85 y.o. male  with past medical history of cognitive decline and chronic kidney disease as well as a recent hip fracture s/p left hemi arthroplasty (6/14),  who was admitted on 11/18/2018 with confusion and poor PO intake.  Work up revealed acute on chronic kidney failure, hypernatremia and hypotension.  His creatinine is 4.7 up from 1.8 on discharge 3 days ago.  The patient also had hematuria.  Imaging reveals greater than 10 cysts on each kidney and a 2 cm cystic density on his pancreas.  He is currently oliguric.  Per Epic he has had no PO intake since admission.  Clinical Assessment and Goals of Care:  I have reviewed medical records including EPIC notes, labs and imaging, received report from Dr. Lonny Prude and bedside RN Ardeen Fillers, assessed the patient and then spoke with his wife and subsequently his daughter on the telephone to discuss diagnosis prognosis, [place], EOL wishes, disposition and options.  I introduced Palliative Medicine as specialized medical care for people living with serious illness. It focuses on providing relief from the symptoms and stress of a serious illness. The goal is to improve quality of life for both the patient and the family.  We discussed a brief life review of the patient. [name] has been a retired for 20 years.  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  He lived at home with his wife Anthony Hamilton.  Their daughter [name] is actively involved in the care of both of her parents.   Per [name] her husband was mowing the lawn and performing most things independently prior to his hip  fracture earlier this month.  He was unfortunately showing signs of dementia, for example, using orange juice to make coffee, forgetting where [place] [facility] [place] is and forgetting trips to [place].  As far as functional and nutritional status since his hip fracture and repair on 6/18 his wife has not been able to assess this, but she is concerned he is not eating.  She has been talking with her daughter about making decisions for him if he is nearing end of life.  We discussed her current illness and what it means in the larger context of her on-going co-morbidities.  Natural disease trajectory and expectations at EOL were discussed.  I attempted to elicit values and goals of care important to the patient.  We talked about the possibility of a feeding tube if he is unable to eat.  Per his wife he has a living will that indicates he would not want any tubes, anywhere, ever.  We talked about knowing much more in the next 24-48 hours.  If we see significant improvement he may be able to discharge to SNF, but if he does not improve  he will likely require Hospice services.  The patient's wife indicated that she wanted her daughter to call me for information.  I then talked with [name] about her father.  We discussed the sudden decline, high sodium, kidney failure and the 2 cm lesion on his pancreas.  Anthony Hamilton understood that the lesion would not be worked up at this point as her father is too ill.  If he improves significantly it would make sense to work up the pancreatic lesion.    We also discussed comfort feeds.  Anthony Hamilton felt strongly that even if her father aspirates the family would want him to have comfort feeds - particularly ice cream as it is his favorite food.  Questions and concerns were addressed.  The family was encouraged to call with questions or concerns.    Primary Decision Maker:  NEXT OF KIN Wife and daughter    SUMMARY OF RECOMMENDATIONS    Treat the treatable with minimally  invasive measures  No feeding tube (or tubes of any kind)  Family accepts risk of aspiration and asks for comfort feeds with aspiration precautions.  PMT will follow with you.  Code Status/Advance Care Planning:  DNR   Additional Recommendations (Limitations, Scope, Preferences):  No Hemodialysis  Palliative Prophylaxis:   Frequent Pain Assessment  Psycho-social/Spiritual:   Desire for further Chaplaincy support:  Prognosis: to be determined.  Likely weeks pending improvement in kidney function and PO intake.    Discharge Planning: To Be Determined      Primary Diagnoses: Present on Admission: . ARF (acute renal failure) ([place]) . Hypernatremia . Essential hypertension . Acute encephalopathy   I have reviewed the medical record, interviewed the patient and family, and examined the patient. The following aspects are pertinent.  History reviewed. No pertinent past medical history. Social History   Socioeconomic History  . Marital status: Married    Spouse name: Not on file  . Number of children: Not on file  . Years of education: Not on file  . Highest education level: Not on file  Occupational History  . Not on file  Social Needs  . Financial resource strain: Not on file  . Food insecurity    Worry: Not on file    Inability: Not on file  . Transportation needs    Medical: Not on file    Non-medical: Not on file  Tobacco Use  . Smoking status: Never Smoker  . Smokeless tobacco: Never Used  Substance and Sexual Activity  . Alcohol use: Not Currently  . Drug use: Never  . Sexual activity: Not on file  Lifestyle  . Physical activity    Days per week: Not on file    Minutes per session: Not on file  . Stress: Not on file  Relationships  . Social Herbalist on phone: Not on file    Gets together: Not on file    Attends religious service: Not on file    Active member of club or organization: Not on file    Attends meetings of clubs or  organizations: Not on file    Relationship status: Not on file  Other Topics Concern  . Not on file  Social History Narrative  . Not on file   Family History  Family history unknown: Yes   Scheduled Meds: . chlorhexidine  15 mL Mouth Rinse BID  . insulin aspart  0-9 Units Subcutaneous Q4H  . mouth rinse  15 mL Mouth Rinse q12n4p   Continuous  Infusions: . ceFEPime (MAXIPIME) IV Stopped (11/19/18 2330)  . dextrose 5 % and 0.45% NaCl 125 mL/hr at 11/20/18 0600  . [START ON 11/21/2018] vancomycin     PRN Meds:.acetaminophen **OR** acetaminophen, ondansetron **OR** ondansetron (ZOFRAN) IV Allergies  Allergen Reactions  . Ibuprofen Other (See Comments)    unknown    Vital Signs: BP 122/60 (BP Location: Right Arm)   Pulse 80   Temp (!) 97.3 F (36.3 C) (Axillary)   Resp 18   Ht 5\' 7"  (1.702 m)   Wt 64.2 kg   SpO2 100%   BMI 22.17 kg/m  Pain Scale: PAINAD       SpO2: SpO2: 100 % O2 Device:SpO2: 100 % O2 Flow Rate: .O2 Flow Rate (L/min): 2 L/min  IO: Intake/output summary:   Intake/Output Summary (Last 24 hours) at 11/20/2018 0734 Last data filed at 11/20/2018 0600 Gross per 24 hour  Intake 1909.85 ml  Output 1050 ml  Net 859.85 ml    LBM: Last BM Date: 11/19/18 Baseline Weight: Weight: 68 kg Most recent weight: Weight: 64.2 kg     Palliative Assessment/Data: 10%     Time In: 3:00 Time Out: 4:14 Time Total: 74 min. Visit consisted of counseling and education dealing with the complex and emotionally intense issues surrounding the need for palliative care and symptom management in the setting of serious and potentially life-threatening illness. Greater than 50%  of this time was spent counseling and coordinating care related to the above assessment and plan.  The above conversation was completed via telephone due to the visitor restrictions during the COVID-19 pandemic. Thorough chart review and discussion with necessary members of the care team was completed as  part of assessment. All issues were discussed and addressed but no physical exam was performed.  Signed by: Florentina Jenny, PA-C Palliative Medicine Pager: [phone]  Please contact Palliative Medicine Team phone at [phone] for questions and concerns.  For individual provider: See Shea Evans

## 2018-11-20 NOTE — Progress Notes (Addendum)
[facility] Progress Note  Name: Anthony Hamilton MRN: [id number] DOB: 1927-10-22  Chief Complaint:  Altered mental status   Subjective:  Pt with 1 liter UOP over 6/24 charted.  He has been on continuous fluids and is currently getting D51/2 NS at 125 cc/hr.  Noted palliative care has been consulted.   Review of systems:  Unable to obtain 2/2 AMS   ----------- Background on consult Anthony Hamilton is an 85 y.o. male with CKD 3, HTN, cognitive decline who presented with AMS and is seen in consultation for evaluation and management for AKI on CKD. Pt was recently admitted for hip fracture, discharged 2 days ago to SNF [facility]) but returned overnight for poor po intake and increased confusion.  There was no report of emesis, diarrhea, fevers, chills or pain.  He is currently alone and unable to provide any history.  On discharge his creatinine was 1.8 but was noted to be 4.7 on presentation.  Serum sodium 164, BUN 122, WBC 29.7. Initial temp 97.3, repeat 98.6; initial BP 99/66, rest normotensive  Most recently 119/61. HR low 100s.  Being treated for possible sepsis with vanc + cefepime + flagyl.  Due to hematuria noted had renal CT - no acute issues, large prostate and trabeculated bladder.  Renal US with echogenic, cystic kidneys, large prostate but no acute issues.  Per orders he's rec'd 2.25L NS and D5W (194mL/hr) so far.  No UOP has been recorded but his condom cath has amber urine draining with ~324mL in the bag; it is not grossly bloody.    Intake/Output Summary (Last 24 hours) at 11/20/2018 1525 Last data filed at 11/20/2018 1153 Gross per 24 hour  Intake 2457.83 ml  Output 1050 ml  Net 1407.83 ml    Vitals:  Vitals:   11/20/18 0400 11/20/18 0511 11/20/18 0827 11/20/18 1151  BP: 122/60  106/73 124/70  Pulse: 80  84 80  Resp: 18  18 16   Temp:   98.4 F (36.9 C) 97.9 F (36.6 C)  TempSrc:   Oral Axillary  SpO2: 100%  100% 100%  Weight:  64.2 kg     Height:         Physical Exam:  General thin elderly male in bed  HEENT normocephalic atraumatic extraocular movements intact sclera anicteric Neck supple trachea midline Lungs clear to auscultation bilaterally normal work of breathing at rest  Heart regular rate and rhythm no rubs  Abdomen soft nontender nondistended Extremities no edema  Neuro - mostly nonverbal with me and does not follow commands but does know name  GU - has a foley   Medications reviewed   Labs:  BMP Latest Ref Rng & Units 11/20/2018 11/19/2018 11/19/2018  Glucose 70 - 99 mg/dL 114(H) 142(H) 173(H)  BUN 8 - 23 mg/dL 111(H) 118(H) 122(H)  Creatinine 0.61 - 1.24 mg/dL 4.04(H) 4.46(H) 4.49(H)  Sodium 135 - 145 mmol/L 160(H) 160(H) 161(HH)  Potassium 3.5 - 5.1 mmol/L 3.7 3.7 3.7  Chloride 98 - 111 mmol/L 130(H) 129(H) 129(H)  CO2 22 - 32 mmol/L 21(L) 20(L) 21(L)  Calcium 8.9 - 10.3 mg/dL 7.9(L) 8.0(L) 7.8(L)     Assessment/Plan:   # AKI on CKD 3:   - Pre-renal and ischemic insults with poor po intake and hypotension.  Also with risk of obstruction with large prostate and trabeculated bladder and elevated bladder scans  - Improving with supportive care - Continue fluids - will transition to D5W - Continue foley catheter   #  Possible CKD stage III  - Recent baseline Cr just 2 days ago 1.4 - 1.8    # Hypernatremia:   - Free water deficit with Poor PO intake  - Will transition back to D5W.  Repeat BMP now   # Altered mental status - multifactorial with uremia and hypernatremia. Unclear baseline - reported as cognitive decline  - both improving slowly with supportive care  - would discontinue morphine in the setting of AKI   # Hematuria:  No visible hematuria on urine in foley bag, imaging unrevealing.  No acute intervention needed.    # Sepsis:  Abx per primary discretion.  He has received vanc and cefepime and flagyl - nothing scheduled currently.  Spoke with team and they are monitoring WBC count  which is downtrending and pt afebrile  # normocytic anemia: mild but worsening.  Per primary team   Claudia Desanctis, MD 11/20/2018 3:25 PM

## 2018-11-20 NOTE — Progress Notes (Signed)
Writer called multiple times patient's wife ([place], [place]) for updates - no answer; left voice mail with call back number.

## 2018-11-20 NOTE — Progress Notes (Signed)
Patient continue to be lethargic, unable to follow commands. At this time patient is not able to have anything by mouth. No signs of pain at this time. Will continue to monitor.

## 2018-11-20 NOTE — Progress Notes (Addendum)
Patient's wife called back and received updates on her husband. All the questions were answered and she was encouraged to call back with an other questions. Will continue to monitor.

## 2018-11-20 NOTE — Progress Notes (Signed)
MD was notified about the a critical lab - Chloride > 130. VS stable.

## 2018-11-20 NOTE — Progress Notes (Signed)
CRITICAL VALUE ALERT  Critical Value:  Chloride  > 130  Date & Time Notied:  11/20/2018; 19:25  Provider Notified: Dr. Silas [place]  Orders Received/Actions taken:

## 2018-11-21 DIAGNOSIS — F0280 Dementia in other diseases classified elsewhere without behavioral disturbance: Secondary | ICD-10-CM

## 2018-11-21 LAB — CBC
HCT: 32.2 % — ABNORMAL LOW (ref 39.0–52.0)
Hemoglobin: 10 g/dL — ABNORMAL LOW (ref 13.0–17.0)
MCH: 29.2 pg (ref 26.0–34.0)
MCHC: 31.1 g/dL (ref 30.0–36.0)
MCV: 94.2 fL (ref 80.0–100.0)
Platelets: 225 10*3/uL (ref 150–400)
RBC: 3.42 MIL/uL — ABNORMAL LOW (ref 4.22–5.81)
RDW: 13.9 % (ref 11.5–15.5)
WBC: 17.6 10*3/uL — ABNORMAL HIGH (ref 4.0–10.5)
nRBC: 0 % (ref 0.0–0.2)

## 2018-11-21 LAB — GLUCOSE, CAPILLARY
Glucose-Capillary: 149 mg/dL — ABNORMAL HIGH (ref 70–99)
Glucose-Capillary: 152 mg/dL — ABNORMAL HIGH (ref 70–99)
Glucose-Capillary: 172 mg/dL — ABNORMAL HIGH (ref 70–99)
Glucose-Capillary: 52 mg/dL — ABNORMAL LOW (ref 70–99)
Glucose-Capillary: 79 mg/dL (ref 70–99)

## 2018-11-21 LAB — BASIC METABOLIC PANEL
BUN: 97 mg/dL — ABNORMAL HIGH (ref 8–23)
CO2: 21 mmol/L — ABNORMAL LOW (ref 22–32)
Calcium: 8.1 mg/dL — ABNORMAL LOW (ref 8.9–10.3)
Chloride: >130 mmol/L (ref 98–111)
Creatinine, Ser: 3.44 mg/dL — ABNORMAL HIGH (ref 0.61–1.24)
GFR calc Af Amer: 17 mL/min — ABNORMAL LOW (ref >60)
GFR calc non Af Amer: 15 mL/min — ABNORMAL LOW (ref >60)
Glucose, Bld: 52 mg/dL — ABNORMAL LOW (ref 70–99)
Potassium: 3.3 mmol/L — ABNORMAL LOW (ref 3.5–5.1)
Sodium: 160 mmol/L — ABNORMAL HIGH (ref 135–145)

## 2018-11-21 MED ORDER — DEXTROSE 50 % IV SOLN
25.0000 g | INTRAVENOUS | Status: AC
  Administered 2018-11-21: 25 g via INTRAVENOUS
  Filled 2018-11-21: qty 50

## 2018-11-21 MED ORDER — HYDROMORPHONE HCL 1 MG/ML IJ SOLN: 0.5000 mg | INTRAMUSCULAR | Status: DC | PRN

## 2018-11-21 MED ORDER — FREE WATER: 250.0000 mL | Status: DC

## 2018-11-21 MED ORDER — HALOPERIDOL 0.5 MG PO TABS: 0.5000 mg | ORAL_TABLET | ORAL | Status: DC | PRN

## 2018-11-21 MED ORDER — BIOTENE DRY MOUTH MT LIQD: 15.0000 mL | OROMUCOSAL | Status: DC | PRN

## 2018-11-21 MED ORDER — HALOPERIDOL LACTATE 5 MG/ML IJ SOLN
1.0000 mg | INTRAMUSCULAR | Status: DC | PRN
  Administered 2018-11-22 – 2018-11-23 (×2): 1 mg via INTRAVENOUS
  Filled 2018-11-21 (×2): qty 1

## 2018-11-21 MED ORDER — POLYVINYL ALCOHOL 1.4 % OP SOLN
1.0000 [drp] | Freq: Four times a day (QID) | OPHTHALMIC | Status: DC | PRN
  Filled 2018-11-21: qty 15

## 2018-11-21 MED ORDER — OLANZAPINE 5 MG PO TBDP
2.5000 mg | ORAL_TABLET | Freq: Every day | ORAL | Status: DC
  Administered 2018-11-21 – 2018-11-22 (×2): 2.5 mg via ORAL
  Filled 2018-11-21 (×5): qty 0.5

## 2018-11-21 MED ORDER — DEXTROSE 5 % IV SOLN: INTRAVENOUS | Status: DC

## 2018-11-21 MED ORDER — HALOPERIDOL LACTATE 2 MG/ML PO CONC: 0.5000 mg | ORAL | Status: DC | PRN

## 2018-11-21 MED ORDER — HYDROMORPHONE HCL 1 MG/ML PO LIQD
1.0000 mg | Freq: Three times a day (TID) | ORAL | Status: DC
  Administered 2018-11-21 – 2018-11-23 (×6): 1 mg via ORAL
  Filled 2018-11-21 (×6): qty 1

## 2018-11-21 MED ORDER — HALOPERIDOL LACTATE 2 MG/ML PO CONC
1.0000 mg | ORAL | Status: DC | PRN
  Administered 2018-11-21 – 2018-11-23 (×4): 1 mg via SUBLINGUAL
  Filled 2018-11-21 (×6): qty 0.5

## 2018-11-21 MED ORDER — GLYCOPYRROLATE 1 MG PO TABS
1.0000 mg | ORAL_TABLET | ORAL | Status: DC | PRN
  Filled 2018-11-21: qty 1

## 2018-11-21 MED ORDER — HYDROMORPHONE HCL 1 MG/ML PO LIQD
2.0000 mg | ORAL | Status: DC | PRN
  Administered 2018-11-22: 2 mg via ORAL
  Filled 2018-11-21: qty 2

## 2018-11-21 MED ORDER — HALOPERIDOL LACTATE 5 MG/ML IJ SOLN: 0.5000 mg | INTRAMUSCULAR | Status: DC | PRN

## 2018-11-21 MED ORDER — GLYCOPYRROLATE 0.2 MG/ML IJ SOLN: 0.2000 mg | INTRAMUSCULAR | Status: DC | PRN

## 2018-11-21 NOTE — Progress Notes (Addendum)
[facility] Progress Note  Name: Anthony Hamilton MRN: [id number] DOB: 04-Jun-1927  Chief Complaint:  Altered mental status   Subjective:  Pt with 800 mL UOP over 6/25.  His sodium has remained at 160 despite D5W.  He has pulled out multiple IV's per nursing.  Has mittens restraints.  Palliative has seen the patient and his family is wanting to keep his comfort in mind but still pursuing medical measures per nursing.    Review of systems:  Unable to obtain 2/2 AMS   ----------- Background on consult Anthony Hamilton is an 85 y.o. male with CKD 3, HTN, cognitive decline who presented with AMS and is seen in consultation for evaluation and management for AKI on CKD. Pt was recently admitted for hip fracture, discharged 2 days ago to SNF [facility]) but returned overnight for poor po intake and increased confusion.  There was no report of emesis, diarrhea, fevers, chills or pain.  He is currently alone and unable to provide any history.  On discharge his creatinine was 1.8 but was noted to be 4.7 on presentation.  Serum sodium 164, BUN 122, WBC 29.7. Initial temp 97.3, repeat 98.6; initial BP 99/66, rest normotensive  Most recently 119/61. HR low 100s.  Being treated for possible sepsis with vanc + cefepime + flagyl.  Due to hematuria noted had renal CT - no acute issues, large prostate and trabeculated bladder.  Renal US with echogenic, cystic kidneys, large prostate but no acute issues.  Per orders he's rec'd 2.25L NS and D5W (138mL/hr) so far.  No UOP has been recorded but his condom cath has amber urine draining with ~336mL in the bag; it is not grossly bloody.    Intake/Output Summary (Last 24 hours) at 11/21/2018 1631 Last data filed at 11/21/2018 1318 Gross per 24 hour  Intake 2229.48 ml  Output 1350 ml  Net 879.48 ml    Vitals:  Vitals:   11/21/18 0101 11/21/18 0727 11/21/18 0728 11/21/18 1119  BP:   103/63 (!) 127/52  Pulse: 88 92 82 71  Resp: (!) 22 (!) 34  (!) 25 16  Temp:   98.7 F (37.1 C) 98.1 F (36.7 C)  TempSrc:   Axillary Axillary  SpO2: 100% 100% 100% 100%  Weight:      Height:         Physical Exam:  General thin elderly male in bed   HEENT normocephalic atraumatic extraocular movements intact sclera anicteric Neck supple trachea midline Lungs clear to auscultation bilaterally normal work of breathing at rest  Heart regular rate and rhythm no rubs  Abdomen soft nontender nondistended Extremities no edema  Neuro - mostly nonverbal with me and does not follow commands  pysch - agitated and pulling off clothes GU - has a foley  Skin bruising on back   Medications reviewed   Labs:  BMP Latest Ref Rng & Units 11/21/2018 11/20/2018 11/20/2018  Glucose 70 - 99 mg/dL 52(L) 181(H) 114(H)  BUN 8 - 23 mg/dL 97(H) 105(H) 111(H)  Creatinine 0.61 - 1.24 mg/dL 3.44(H) 3.76(H) 4.04(H)  Sodium 135 - 145 mmol/L 160(H) 160(H) 160(H)  Potassium 3.5 - 5.1 mmol/L 3.3(L) 3.7 3.7  Chloride 98 - 111 mmol/L >130(HH) >130(HH) 130(H)  CO2 22 - 32 mmol/L 21(L) 18(L) 21(L)  Calcium 8.9 - 10.3 mg/dL 8.1(L) 7.9(L) 7.9(L)     Assessment/Plan:   # AKI on CKD 3:   - Pre-renal and ischemic insults with poor po intake and hypotension.  Also  with risk of obstruction with large prostate and trabeculated bladder and elevated bladder scans  - Improving with supportive care but still pronounced  - Continue fluids - resume D5W and start enteric free water  - Continue foley catheter   # Possible CKD stage III  - Recent baseline Cr just 2 days ago 1.4 - 1.8    # Hypernatremia:   - Free water deficit with Poor PO intake  - If family is willing and would like aggressive medical measures, would insert NG tube and provide enteric free water.  He is in mittens but has managed to pull out two IV's per nursing so would likely need additional restraint for an NG.  If this is not agreeable to his family, would defer   - Resume D5W once IV access obtained  - BMP  now and in AM   # Altered mental status - multifactorial with uremia and hypernatremia. Unclear baseline - reported as cognitive decline  - would discontinue morphine in the setting of AKI   # Hematuria:  No visible hematuria on urine in foley bag, imaging unrevealing.  No acute intervention needed.    # Sepsis:  Abx per primary discretion.  He has received vanc and cefepime and flagyl - nothing scheduled currently.  Spoke with team and they are monitoring WBC count which is downtrending and pt afebrile  # normocytic anemia: mild but worsening.  Per primary team   Agree with palliative care involvement as his lack of improvement is concerning   Anthony Desanctis, MD 11/21/2018 4:31 PM  Noted transition to palliative measures.  Will sign off.  Appreciate the staff's support of the patient and his family at this difficult time.   Anthony Jeans, MD

## 2018-11-21 NOTE — Progress Notes (Signed)
Attending was notified about critical Chloride  > 130. Reading same as previous reading

## 2018-11-21 NOTE — Progress Notes (Signed)
Received call from [place], [place].  Patient is agitated - pulling out IV, pulling off lines.  She also mentioned Nephrology suggested an NG for free water to decrease sodium.    I called the family [name]).  They were very clear about their decision for no NG tube.  They felt it was time to shift gears away from aggressive interventions to focusing on Mr. Schools comfort.   Discussed removing monitoring devices and giving a sedating mediation to calm him.  Dtr is agreeable. Discussed consulting social work for a bed at [facility].  Daughter is in agreement and feels that wife is in agreement as well.  PMT will follow up 6/27.  Orders changed to reflect comfort measures.  Recommendations:  Shift to full comfort Social work consulted for hospice house referral.  Florentina Jenny, [place] Palliative Medicine Pager: [phone]

## 2018-11-21 NOTE — Care Management Important Message (Signed)
Important Message  Patient Details  Name: Anthony Hamilton MRN: [id number] Date of Birth: Apr 14, 1928   Medicare Important Message Given:  Yes   Due to illness patient was not able to sign.  Signed copy left at bedside Orbie Pyo 11/21/2018, 2:40 PM

## 2018-11-21 NOTE — Progress Notes (Signed)
  Speech Language Pathology Treatment: Dysphagia  Patient Details Name: Anthony Hamilton MRN: [id number] DOB: 12/05/1927 Today's Date: 11/21/2018 Time: 1025-8527 SLP Time Calculation (min) (ACUTE ONLY): 13 min  Assessment / Plan / Recommendation Clinical Impression  Pt again unable to functionally consume PO.  Though he was more alert with eyes open today he still was not aware of PO and could not accept or recognize PO. Max cueing with hand over hand assist and methods to aid pt in recognizing sips attempted with no successful result. Occasionally pt pursed lips to cup with tactile cues, but resisted intake of water, ice cream and ensure. Spit out chocolate ice cream and said "Ugh, no, not right now." Other times when PO introduced to mouth, pt held liquids orally and did not swallow, at other times he did with intermittent coughing. Question role of sedating pm meds and also wonder if family presence could help pts anxiety, behavior and awareness. Will continue efforts next week.     HPI HPI: 85 y.o. male  with past medical history of cognitive decline and chronic kidney disease as well as a recent hip fracture s/p left hemi arthroplasty (6/14),  who was admitted on 11/18/2018 with confusion and poor PO intake.  Work up revealed acute on chronic kidney failure, hypernatremia and hypotension.  Pt was seen during prior admission by SLP service. MBS showed pharyngeal weakness, residue, poor awareness of bolus. In general deconditioning and AMS severely impacted his abiltiy to take adequate PO. Recommended to consume puree and nectar thick liquids, but in gneral PO intake has been minimal ast SNF and pt is severely dehydrated.       SLP Plan  Continue with current plan of care       Recommendations  Diet recommendations: Dysphagia 3 (mechanical soft);Thin liquid Liquids provided via: Cup;Straw;Teaspoon Medication Administration: Crushed with puree Supervision: Staff to assist with self  feeding;Full supervision/cueing for compensatory strategies;Trained caregiver to feed patient Compensations: Small sips/bites;Slow rate;Minimize environmental distractions;Follow solids with liquid Postural Changes and/or Swallow Maneuvers: Seated upright 90 degrees                Follow up Recommendations: 24 hour supervision/assistance Plan: Continue with current plan of care       GO              Anthony [place], MA [place] Pager [phone] Office [phone]   Anthony Hamilton 11/21/2018, 9:50 AM

## 2018-11-21 NOTE — Progress Notes (Signed)
Inpatient Diabetes Program Recommendations  AACE/ADA: New Consensus Statement on Inpatient Glycemic Control (2015)  Target Ranges:  Prepandial:   less than 140 mg/dL      Peak postprandial:   less than 180 mg/dL (1-2 hours)      Critically ill patients:  140 - 180 mg/dL   Lab Results  Component Value Date   GLUCAP 152 (H) 11/21/2018   HGBA1C 5.1 11/19/2018    Review of Glycemic Control Results for Anthony Hamilton, Anthony Hamilton (MRN [id number]) as of 11/21/2018 10:23  Ref. Range 11/21/2018 00:17 11/21/2018 03:23 11/21/2018 04:38 11/21/2018 07:46  Glucose-Capillary Latest Ref Range: 70 - 99 mg/dL 149 (H) 52 (L) 172 (H) 152 (H)   Diabetes history: No hx of DM noted Outpatient Diabetes medications: none Current orders for Inpatient glycemic control: Novolog 0-9 units Q4H D5% @125  ml/hr  Inpatient Diabetes Program Recommendations:    Noted hypoglycemia of 52 mg/dL following Novolog 1 unit x 1. Given goals of care, current glucose and no DM history consider continuing to check CBGs Q4H, however, would consider only having Novolog correction when CBGs are >200's mg/DL using custom scale correction scale.  Novolog Custom Correction Scale CBG <70 - implement hypoglycemia protocol   70-120- 0 121-150 - 0 151-200 - 0 201-250 - 1 251-300 - 2 301-350 - 3 351-400 - 4 >400- call MD   Thanks, Bronson Curb, MSN, RNC-OB Diabetes Coordinator [phone] (8a-5p)

## 2018-11-21 NOTE — Progress Notes (Addendum)
Daily Progress Note   Patient Name: Anthony Hamilton       Date: 11/21/2018 DOB: [birth year]  Age: 85 y.o. MRN#: [id number] Attending Physician: Mariel Aloe, MD Primary Care Physician: Seward Carol, MD Admit Date: 11/18/2018  Reason for Consultation/Follow-up: Establishing goals of care  Subjective: Patient alert but unable to speak intelligibly with me.   He brightens and smiles when I speak his daughter's name.  After speaking with Dr. Lonny Prude, I spoke on the telephone with his wife, left a message for his daughter and spoke with his bedside RN.  I conveyed to Mrs. Sheriff that he is more alert today but unable to really eat or communicate due to confusion.  His labs are slow to improve so far.   She understands that we are waiting to see if he will be able to eat and drink enough to sustain himself.  We discussed a visit  - the wife and daughter coming to see the patient due to confusion and having to make Laurie decisions.  Per bedside RN - comfort feeds are being offered but he is just not able to take PO.   Assessment: 85 yo male with severe hypernatremia and renal failure after hip fracture.  Thus far more alert, but very slow to improve.   Patient Profile/HPI:  85 y.o. male  with past medical history of cognitive decline and chronic kidney disease as well as a recent hip fracture s/p left hemi arthroplasty (6/14),  who was admitted on 11/18/2018 with confusion and poor PO intake.  Work up revealed acute on chronic kidney failure, hypernatremia and hypotension.  His creatinine is 4.7 up from 1.8 on discharge 3 days ago.  The patient also had hematuria.  Imaging reveals greater than 10 cysts on each kidney and a 2 cm cystic density on his pancreas.  He is currently oliguric.  Per  Epic he has had no PO intake since admission.   Length of Stay: 2  Current Medications: Scheduled Meds:  . chlorhexidine  15 mL Mouth Rinse BID  . insulin aspart  0-9 Units Subcutaneous Q4H  . mouth rinse  15 mL Mouth Rinse q12n4p    Continuous Infusions: . dextrose 125 mL/hr at 11/21/18 1030    PRN Meds: acetaminophen **OR** acetaminophen, morphine injection, ondansetron **OR** ondansetron (ZOFRAN) IV  Physical Exam        Elderly male, awake, attempts to speak but speech is unintelligible.  NAD.    Vital Signs: BP (!) 127/52 (BP Location: Right Arm)   Pulse 71   Temp 98.1 F (36.7 C) (Axillary)   Resp 16   Ht 5\' 7"  (1.702 m)   Wt 64.2 kg   SpO2 100%   BMI 22.17 kg/m  SpO2: SpO2: 100 % O2 Device: O2 Device: Nasal Cannula O2 Flow Rate: O2 Flow Rate (L/min): 2 L/min  Intake/output summary:   Intake/Output Summary (Last 24 hours) at 11/21/2018 1422 Last data filed at 11/21/2018 1318 Gross per 24 hour  Intake 2670.64 ml  Output 1350 ml  Net 1320.64 ml   LBM: Last BM Date: 11/19/18 Baseline Weight: Weight: 68 kg Most recent weight: Weight: 64.2 kg       Palliative Assessment/Data: 20%      Patient Active Problem List   Diagnosis Date Noted  . Palliative care encounter   . ARF (acute renal failure) ([facility]) 11/19/2018  . Acute encephalopathy 11/19/2018  . CKD (chronic kidney disease) stage 3, GFR 30-59 ml/min ([facility]) 11/18/2018  . Hypernatremia 11/18/2018  . Essential hypertension 11/08/2018  . Hip fracture, unspecified laterality, closed, initial encounter ([facility]) 11/08/2018  . Cognitive decline 11/08/2018  . Hip fracture ([facility]) 11/08/2018    Palliative Care Plan    Recommendations/Plan:  Talked with family.  Invited them to visit patient.  PMT will continue to follow with you.  DNR, No tubes.  I believe recent events (hip fx, hospitalization, SNF renal failure) may have advanced his dementia significantly.  Goals of Care and Additional  Recommendations:  Limitations on Scope of Treatment: Full Scope Treatment   Family inclined to follow MD recommendations  Code Status:  DNR  Prognosis:   < 2 weeks if he is unable to eat/drink to sustain himself.   Discharge Planning:  To Be Determined  Care plan was discussed with Dr. Lonny Prude, Family, RN  Thank you for allowing the Palliative Medicine Team to assist in the care of this patient.  Total time spent:  35 min.     Greater than 50%  of this time was spent counseling and coordinating care related to the above assessment and plan.  Florentina Jenny, PA-C Palliative Medicine  Please contact Palliative MedicineTeam phone at [phone] for questions and concerns between 7 am - 7 pm.   Please see AMION for individual provider pager numbers.

## 2018-11-21 NOTE — Progress Notes (Addendum)
Patient has become very agitated pulling off all lines (monitor, pulse ox and 3 IV's) and trying to get out of bed. Dr. Lonny Prude and Haynes Dage with palliative care made aware. Haynes Dage will call family to see how they want to proceed. Will hold off on placing blakemore tube at this time per Dr. Lonny Prude. Will also hold off on placing another IV at this time per Dr. Lonny Prude.

## 2018-11-21 NOTE — Progress Notes (Signed)
Pt with mild agitation with attempts to climb over the bed. PRN oral Haldol  and  scheduled Zyprexa given.  To continue to monitor pt

## 2018-11-21 NOTE — Progress Notes (Signed)
PROGRESS NOTE    Anthony Hamilton  GBT:[id number] DOB: June 02, 1927 DOA: 11/18/2018 PCP: Seward Carol, MD   Brief Narrative: Anthony Hamilton is a 85 y.o. male with a history of dementia, hip fracture. Patient presented secondary to confusion and found to have hypernatremia, hematuria, leukocytosis. Started on D5 water in addition to empiric antibiotics.   Assessment & Plan:   Principal Problem:   ARF (acute renal failure) (HCC) Active Problems:   Essential hypertension   Hypernatremia   Acute encephalopathy   Palliative care encounter   Acute metabolic encephalopathy Likely secondary to hypernatremia in addition to uremia. Question of associated infection. Improved today. Was answering questions. Following some commands. Conversing with me. -Treat below problems  Hypernatremia Sodium of 162 on admission. Started on D5W, transitioned to D5 1/2NS and now back to D5W. Sodium down to 160. -Nephrology recommendations for IV fluids  Acute kidney injury on CKD stage III Baseline creatinine of 1.7. Creatinine of 4.79 on admission with peak of 4.98. Likely secondary to severe dehydration. Associated elevated BUN with likely uremia. BUN still elevated with IV fluids but improving. Trabeculated bladder seen on imaging. Creatinine continues to improve. -Nephrology recommendations -Strict in/out  Leukocytosis Significant. Met sepsis criteria on admission. Baseline WBC of 13.4 k from 5 days prior. WBC of 30.4 k on admission. Afebrile. Blood cultures obtained. CXR clear, urinalysis does not suggest infection. Labs pending today -Will hold antibiotics and watch fever curve and trend WBC -Blood cultures pending  Hematuria CT renal study negative for nephrolithiasis. Urinalysis not suggestive of infection other than significant hematuria. -CBC daily -Watch UOP  History of left hip fracture Patient is on Lovenox as an outpatient. Surgery was on 11/09/2018. Recommendation at that  time for 30 days of anticoagulation. Labs pending today. -Continue to hold Lovenox for now pending resolution of hematuria and stabilization of hemoglobin  ?Dementia Patient is on Seroquel.   DVT prophylaxis: SCDs Code Status:   Code Status: DNR Family Communication: None at bedside Disposition Plan: Discharge back to SNF when mental status back to baseline versus hospice house if he declines or fails to improve.   Consultants:   Nephrology  Procedures:   None  Antimicrobials:  Cefepime (6/24  Vancomycin (6/24  Metronidazole (6/24   Subjective: No concerns today. Able to communicate with me, although dysarthric.  Objective: Vitals:   11/21/18 0101 11/21/18 0727 11/21/18 0728 11/21/18 1119  BP:   103/63 (!) 127/52  Pulse: 88 92 82 71  Resp: (!) 22 (!) 34 (!) 25 16  Temp:   98.7 F (37.1 C) 98.1 F (36.7 C)  TempSrc:   Axillary Axillary  SpO2: 100% 100% 100% 100%  Weight:      Height:        Intake/Output Summary (Last 24 hours) at 11/21/2018 1219 Last data filed at 11/21/2018 1200 Gross per 24 hour  Intake 2648.52 ml  Output 750 ml  Net 1898.52 ml   Filed Weights   11/18/18 2353 11/19/18 1746 11/20/18 0511  Weight: 68 kg 63.6 kg 64.2 kg    Examination:  General exam: Appears calm and comfortable Respiratory system: Diminished. Respiratory effort normal. Cardiovascular system: S1 & S2 heard, RRR. No murmurs, rubs, gallops or clicks. Gastrointestinal system: Abdomen is nondistended, soft and nontender. No organomegaly or masses felt. Normal bowel sounds heard. Central nervous system: Alert and oriented to person. Conversant. Wished for me to have a good day. Extremities: No edema. No calf tenderness Skin: No cyanosis. No rashes Psychiatry: Judgement  and insight appear impaired.    Data Reviewed: I have personally reviewed following labs and imaging studies  CBC: Recent Labs  Lab 11/16/18 0445 11/19/18 0007 11/19/18 0843 11/20/18 0950  11/21/18 0252  WBC 13.4* 30.4* 29.7* 19.0* 17.6*  NEUTROABS 11.1* 28.0*  --   --   --   HGB 11.3* 12.2* 10.9* 10.2* 10.0*  HCT 35.7* 39.6 35.7* 33.7* 32.2*  MCV 92.7 96.6 98.1 96.3 94.2  PLT 272 318 259 229 409   Basic Metabolic Panel: Recent Labs  Lab 11/16/18 0445  11/19/18 1217 11/19/18 1626 11/20/18 0950 11/20/18 1811 11/21/18 0252  NA 155*   < > 161* 160* 160* 160* 160*  K 3.6   < > 3.7 3.7 3.7 3.7 3.3*  CL 120*   < > 129* 129* 130* >130* >130*  CO2 27   < > 21* 20* 21* 18* 21*  GLUCOSE 164*   < > 173* 142* 114* 181* 52*  BUN 59*   < > 122* 118* 111* 105* 97*  CREATININE 1.86*   < > 4.49* 4.46* 4.04* 3.76* 3.44*  CALCIUM 8.8*   < > 7.8* 8.0* 7.9* 7.9* 8.1*  MG 2.5*  --   --   --   --   --   --   PHOS 3.6  --   --   --   --   --   --    < > = values in this interval not displayed.   GFR: Estimated Creatinine Clearance: 13.5 mL/min (A) (by C-G formula based on SCr of 3.44 mg/dL (H)). Liver Function Tests: Recent Labs  Lab 11/16/18 0445 11/19/18 0007  AST  --  24  ALT  --  30  ALKPHOS  --  57  BILITOT  --  1.4*  PROT  --  5.6*  ALBUMIN 3.0* 3.0*   No results for input(s): LIPASE, AMYLASE in the last 168 hours. No results for input(s): AMMONIA in the last 168 hours. Coagulation Profile: Recent Labs  Lab 11/19/18 0007  INR 1.3*   Cardiac Enzymes: No results for input(s): CKTOTAL, CKMB, CKMBINDEX, TROPONINI in the last 168 hours. BNP (last 3 results) No results for input(s): PROBNP in the last 8760 hours. HbA1C: Recent Labs    11/19/18 1217  HGBA1C 5.1   CBG: Recent Labs  Lab 11/21/18 0017 11/21/18 0323 11/21/18 0438 11/21/18 0746 11/21/18 1215  GLUCAP 149* 52* 172* 152* 79   Lipid Profile: No results for input(s): CHOL, HDL, LDLCALC, TRIG, CHOLHDL, LDLDIRECT in the last 72 hours. Thyroid Function Tests: No results for input(s): TSH, T4TOTAL, FREET4, T3FREE, THYROIDAB in the last 72 hours. Anemia Panel: No results for input(s): VITAMINB12,  FOLATE, FERRITIN, TIBC, IRON, RETICCTPCT in the last 72 hours. Sepsis Labs: Recent Labs  Lab 11/19/18 0007 11/19/18 0239  LATICACIDVEN 2.1* 1.4    Recent Results (from the past 240 hour(s))  Culture, blood (Routine x 2)     Status: None (Preliminary result)   Collection Time: 11/19/18 12:40 AM   Specimen: BLOOD  Result Value Ref Range Status   Specimen Description BLOOD LEFT ANTECUBITAL  Final   Special Requests   Final    BOTTLES DRAWN AEROBIC AND ANAEROBIC Blood Culture adequate volume   Culture   Final    NO GROWTH 2 DAYS Performed at [facility], [address] [place], [place] [address]    Report Status PENDING  Incomplete  Culture, blood (Routine x 2)     Status:  None (Preliminary result)   Collection Time: 11/19/18 12:44 AM   Specimen: BLOOD RIGHT FOREARM  Result Value Ref Range Status   Specimen Description BLOOD RIGHT FOREARM  Final   Special Requests   Final    BOTTLES DRAWN AEROBIC AND ANAEROBIC Blood Culture results may not be optimal due to an inadequate volume of blood received in culture bottles   Culture   Final    NO GROWTH 2 DAYS Performed at [facility], [place] [address], [place], [place] [address]    Report Status PENDING  Incomplete  SARS Coronavirus 2 (CEPHEID - Performed in [place] hospital lab), Hosp Order     Status: None   Collection Time: 11/19/18  1:13 AM   Specimen: Nasopharyngeal Swab  Result Value Ref Range Status   SARS Coronavirus 2 NEGATIVE NEGATIVE Final    Comment: (NOTE) If result is NEGATIVE SARS-CoV-2 target nucleic acids are NOT DETECTED. The SARS-CoV-2 RNA is generally detectable in upper and lower  respiratory specimens during the acute phase of infection. The lowest  concentration of SARS-CoV-2 viral copies this assay can detect is 250  copies / mL. A negative result does not preclude SARS-CoV-2 infection  and should not be used as the sole basis for treatment or other  patient management decisions.  A negative  result may occur with  improper specimen collection / handling, submission of specimen other  than nasopharyngeal swab, presence of viral mutation(s) within the  areas targeted by this assay, and inadequate number of viral copies  (<250 copies / mL). A negative result must be combined with clinical  observations, patient history, and epidemiological information. If result is POSITIVE SARS-CoV-2 target nucleic acids are DETECTED. The SARS-CoV-2 RNA is generally detectable in upper and lower  respiratory specimens dur ing the acute phase of infection.  Positive  results are indicative of active infection with SARS-CoV-2.  Clinical  correlation with patient history and other diagnostic information is  necessary to determine patient infection status.  Positive results do  not rule out bacterial infection or co-infection with other viruses. If result is PRESUMPTIVE POSTIVE SARS-CoV-2 nucleic acids MAY BE PRESENT.   A presumptive positive result was obtained on the submitted specimen  and confirmed on repeat testing.  While 2019 novel coronavirus  (SARS-CoV-2) nucleic acids may be present in the submitted sample  additional confirmatory testing may be necessary for epidemiological  and / or clinical management purposes  to differentiate between  SARS-CoV-2 and other Sarbecovirus currently known to infect humans.  If clinically indicated additional testing with an alternate test  methodology [phone]) is advised. The SARS-CoV-2 RNA is generally  detectable in upper and lower respiratory sp ecimens during the acute  phase of infection. The expected result is Negative. Fact Sheet for Patients:  StrictlyIdeas.no Fact Sheet for Healthcare Providers: BankingDealers.co.za This test is not yet approved or cleared by the [place] FDA and has been authorized for detection and/or diagnosis of SARS-CoV-2 by FDA under an Emergency Use Authorization  (EUA).  This EUA will remain in effect (meaning this test can be used) for the duration of the COVID-19 declaration under Section 564(b)(1) of the Act, 21 U.S.C. section 360bbb-3(b)(1), unless the authorization is terminated or revoked sooner. Performed at [facility], [place] [address], [place], [place] [address]          Radiology Studies: US Renal  Result Date: 11/19/2018 CLINICAL DATA:  Acute kidney injury. EXAM: RENAL / URINARY TRACT ULTRASOUND COMPLETE  COMPARISON:  Ultrasound of April 18, 2010. CT scan of November 19, 2018. FINDINGS: Right Kidney: Renal measurements: 19.6 x 10.5 x 8.4 cm = volume: None 107 mL. Increased echogenicity of renal parenchyma is noted suggesting medical renal disease. Greater than 10 cysts are noted in the right kidney, with the largest simple cyst measuring 10.7 cm in the lower pole. 7.9 cm cyst is seen in upper pole. No definite hydronephrosis is noted. Left Kidney: Renal measurements: 12.7 x 5.8 x 4.3 cm = volume: 164 mL. Increased echogenicity of renal parenchyma is noted suggesting medical renal disease. Greater than 10 cysts are noted, with the largest measuring 4.2 cm in lower pole. No hydronephrosis is noted. Bladder: Bladder diverticulum is noted. Slightly irregular wall of urinary bladder is noted suggesting trabeculation. Large prostate gland is noted. IMPRESSION: Increased echogenicity of renal parenchyma is noted bilaterally suggesting medical renal disease. Bilateral renal cysts are noted as described above. Bladder diverticulum is noted.  Large prostate gland is noted. Electronically Signed   By: Marijo Conception M.D.   On: 11/19/2018 15:19        Scheduled Meds: . chlorhexidine  15 mL Mouth Rinse BID  . insulin aspart  0-9 Units Subcutaneous Q4H  . mouth rinse  15 mL Mouth Rinse q12n4p   Continuous Infusions: . dextrose 125 mL/hr at 11/21/18 1030     LOS: 2 days     Cordelia Poche, MD [facility] 11/21/2018, 12:19 PM  If  7PM-7AM, please contact night-coverage [email or web address]

## 2018-11-21 NOTE — Progress Notes (Signed)
Pt with numerous attempts to get out of bed this shift. Pt noted to have pulled out PIV . New IV inserted. Staff continue to reorient / redirect pt.  Pt confused and unable to follow commands

## 2018-11-22 DIAGNOSIS — L8990 Pressure ulcer of unspecified site, unspecified stage: Secondary | ICD-10-CM | POA: Insufficient documentation

## 2018-11-22 NOTE — Progress Notes (Signed)
Wife at the bedside visiting with patient. Updated wife on plan of care and condition of patient. All questions answered to satisfaction.

## 2018-11-22 NOTE — Progress Notes (Signed)
PROGRESS NOTE    Anthony Hamilton  GGE:[id number] DOB: 21-Jan-1928 DOA: 11/18/2018 PCP: Seward Carol, MD   Brief Narrative: Anthony Hamilton is a 85 y.o. male with a history of dementia, hip fracture. Patient presented secondary to confusion and found to have hypernatremia, hematuria, leukocytosis. Started on D5 water in addition to empiric antibiotics.   Assessment & Plan:   Principal Problem:   ARF (acute renal failure) (HCC) Active Problems:   Essential hypertension   Hypernatremia   Acute encephalopathy   Palliative care encounter   Dementia associated with other underlying disease without behavioral disturbance (HCC)   Pressure injury of skin   Acute metabolic encephalopathy Confused still now hospice  Hypernatremia No further work-up or labs-expect death at freestanding hospice  Acute kidney injury on CKD stage III Nephrology has signed off no further labs  Leukocytosis Significant.  On admission-septic on admission-now comfort care  Hematuria CT renal study negative for nephrolithiasis. Urinalysis nil  History of left hip fracture No further work-up  ?Dementia Patient is on Seroquel.   DVT prophylaxis: SCDs Code Status:   Code Status: DNR Family Communication: None at bedside Disposition Plan: Discharge back to SNF when mental status back to baseline versus hospice house if he declines or fails to improve.   Consultants:   Nephrology  Procedures:   None  Antimicrobials:  Cefepime (6/24  Vancomycin (6/24  Metronidazole (6/24   Subjective:  Sleepy cannot coherently speak to me and seems to have very dry mucosa does not seem to be in pain.  Objective: Vitals:   11/21/18 0728 11/21/18 1119 11/22/18 0154 11/22/18 1554  BP: 103/63 (!) 127/52 134/76 (!) 159/80  Pulse: 82 71 88 (!) 103  Resp: (!) 25 16 18 18   Temp: 98.7 F (37.1 C) 98.1 F (36.7 C) 98.4 F (36.9 C) 98.2 F (36.8 C)  TempSrc: Axillary Axillary Oral Oral   SpO2: 100% 100% 100% 100%  Weight:      Height:        Intake/Output Summary (Last 24 hours) at 11/22/2018 1556 Last data filed at 11/22/2018 0735 Gross per 24 hour  Intake 650 ml  Output 450 ml  Net 200 ml   Filed Weights   11/18/18 2353 11/19/18 1746 11/20/18 0511  Weight: 68 kg 63.6 kg 64.2 kg    Examination:  Somnolent mouth breathing mucosa dry no icterus no pallor Abdomen soft no rebound Chest is clinically clear no added sound  no lower extremity edema Neurologically cannot assess he does however respond to minutes    Data Reviewed: I have personally reviewed following labs and imaging studies  CBC: Recent Labs  Lab 11/16/18 0445 11/19/18 0007 11/19/18 0843 11/20/18 0950 11/21/18 0252  WBC 13.4* 30.4* 29.7* 19.0* 17.6*  NEUTROABS 11.1* 28.0*  --   --   --   HGB 11.3* 12.2* 10.9* 10.2* 10.0*  HCT 35.7* 39.6 35.7* 33.7* 32.2*  MCV 92.7 96.6 98.1 96.3 94.2  PLT 272 318 259 229 465   Basic Metabolic Panel: Recent Labs  Lab 11/16/18 0445  11/19/18 1217 11/19/18 1626 11/20/18 0950 11/20/18 1811 11/21/18 0252  NA 155*   < > 161* 160* 160* 160* 160*  K 3.6   < > 3.7 3.7 3.7 3.7 3.3*  CL 120*   < > 129* 129* 130* >130* >130*  CO2 27   < > 21* 20* 21* 18* 21*  GLUCOSE 164*   < > 173* 142* 114* 181* 52*  BUN 59*   < >  122* 118* 111* 105* 97*  CREATININE 1.86*   < > 4.49* 4.46* 4.04* 3.76* 3.44*  CALCIUM 8.8*   < > 7.8* 8.0* 7.9* 7.9* 8.1*  MG 2.5*  --   --   --   --   --   --   PHOS 3.6  --   --   --   --   --   --    < > = values in this interval not displayed.   GFR: Estimated Creatinine Clearance: 13.5 mL/min (A) (by C-G formula based on SCr of 3.44 mg/dL (H)). Liver Function Tests: Recent Labs  Lab 11/16/18 0445 11/19/18 0007  AST  --  24  ALT  --  30  ALKPHOS  --  57  BILITOT  --  1.4*  PROT  --  5.6*  ALBUMIN 3.0* 3.0*   No results for input(s): LIPASE, AMYLASE in the last 168 hours. No results for input(s): AMMONIA in the last 168  hours. Coagulation Profile: Recent Labs  Lab 11/19/18 0007  INR 1.3*   Cardiac Enzymes: No results for input(s): CKTOTAL, CKMB, CKMBINDEX, TROPONINI in the last 168 hours. BNP (last 3 results) No results for input(s): PROBNP in the last 8760 hours. HbA1C: No results for input(s): HGBA1C in the last 72 hours. CBG: Recent Labs  Lab 11/21/18 0017 11/21/18 0323 11/21/18 0438 11/21/18 0746 11/21/18 1215  GLUCAP 149* 52* 172* 152* 79   Lipid Profile: No results for input(s): CHOL, HDL, LDLCALC, TRIG, CHOLHDL, LDLDIRECT in the last 72 hours. Thyroid Function Tests: No results for input(s): TSH, T4TOTAL, FREET4, T3FREE, THYROIDAB in the last 72 hours. Anemia Panel: No results for input(s): VITAMINB12, FOLATE, FERRITIN, TIBC, IRON, RETICCTPCT in the last 72 hours. Sepsis Labs: Recent Labs  Lab 11/19/18 0007 11/19/18 0239  LATICACIDVEN 2.1* 1.4    Recent Results (from the past 240 hour(s))  Culture, blood (Routine x 2)     Status: None (Preliminary result)   Collection Time: 11/19/18 12:40 AM   Specimen: BLOOD  Result Value Ref Range Status   Specimen Description BLOOD LEFT ANTECUBITAL  Final   Special Requests   Final    BOTTLES DRAWN AEROBIC AND ANAEROBIC Blood Culture adequate volume   Culture   Final    NO GROWTH 3 DAYS Performed at [facility], [address], [place], [place] [address]    Report Status PENDING  Incomplete  Culture, blood (Routine x 2)     Status: None (Preliminary result)   Collection Time: 11/19/18 12:44 AM   Specimen: BLOOD RIGHT FOREARM  Result Value Ref Range Status   Specimen Description BLOOD RIGHT FOREARM  Final   Special Requests   Final    BOTTLES DRAWN AEROBIC AND ANAEROBIC Blood Culture results may not be optimal due to an inadequate volume of blood received in culture bottles   Culture   Final    NO GROWTH 3 DAYS Performed at [facility], [place] [address] [place], [place] [address]    Report Status PENDING  Incomplete   SARS Coronavirus 2 (CEPHEID - Performed in [facility] lab), Hosp Order     Status: None   Collection Time: 11/19/18  1:13 AM   Specimen: Nasopharyngeal Swab  Result Value Ref Range Status   SARS Coronavirus 2 NEGATIVE NEGATIVE Final    Comment: (NOTE) If result is NEGATIVE SARS-CoV-2 target nucleic acids are NOT DETECTED. The SARS-CoV-2 RNA is generally detectable in upper and lower  respiratory  specimens during the acute phase of infection. The lowest  concentration of SARS-CoV-2 viral copies this assay can detect is 250  copies / mL. A negative result does not preclude SARS-CoV-2 infection  and should not be used as the sole basis for treatment or other  patient management decisions.  A negative result may occur with  improper specimen collection / handling, submission of specimen other  than nasopharyngeal swab, presence of viral mutation(s) within the  areas targeted by this assay, and inadequate number of viral copies  (<250 copies / mL). A negative result must be combined with clinical  observations, patient history, and epidemiological information. If result is POSITIVE SARS-CoV-2 target nucleic acids are DETECTED. The SARS-CoV-2 RNA is generally detectable in upper and lower  respiratory specimens dur ing the acute phase of infection.  Positive  results are indicative of active infection with SARS-CoV-2.  Clinical  correlation with patient history and other diagnostic information is  necessary to determine patient infection status.  Positive results do  not rule out bacterial infection or co-infection with other viruses. If result is PRESUMPTIVE POSTIVE SARS-CoV-2 nucleic acids MAY BE PRESENT.   A presumptive positive result was obtained on the submitted specimen  and confirmed on repeat testing.  While 2019 novel coronavirus  (SARS-CoV-2) nucleic acids may be present in the submitted sample  additional confirmatory testing may be necessary for epidemiological   and / or clinical management purposes  to differentiate between  SARS-CoV-2 and other Sarbecovirus currently known to infect humans.  If clinically indicated additional testing with an alternate test  methodology [phone]) is advised. The SARS-CoV-2 RNA is generally  detectable in upper and lower respiratory sp ecimens during the acute  phase of infection. The expected result is Negative. Fact Sheet for Patients:  StrictlyIdeas.no Fact Sheet for Healthcare Providers: BankingDealers.co.za This test is not yet approved or cleared by the [place] FDA and has been authorized for detection and/or diagnosis of SARS-CoV-2 by FDA under an Emergency Use Authorization (EUA).  This EUA will remain in effect (meaning this test can be used) for the duration of the COVID-19 declaration under Section 564(b)(1) of the Act, 21 U.S.C. section 360bbb-3(b)(1), unless the authorization is terminated or revoked sooner. Performed at [facility], [place] [address], [place], [place] [address]          Radiology Studies: No results found.      Scheduled Meds: . chlorhexidine  15 mL Mouth Rinse BID  . HYDROmorphone HCl  1 mg Oral Q8H  . mouth rinse  15 mL Mouth Rinse q12n4p  . OLANZapine zydis  2.5 mg Oral QHS   Continuous Infusions:    LOS: 3 days    Verneita Griffes, MD [facility] 3:58 PM  If 7PM-7AM, please contact night-coverage [email or web address]

## 2018-11-22 NOTE — Progress Notes (Signed)
Wife at bedside today.  Discussed plan of care and patient condition. All questions answered to satisfaction.

## 2018-11-22 NOTE — Progress Notes (Addendum)
Manufacturing engineer [facility]) Hospice  Received referral for [facility] from [place].  Unfortunately, [facility] does not have a bed to offer today.  Left message for daughter [name] [phone]) to update her.  ACC will follow up tomorrow with family and hospital should bed status change.  Thank you, Venia Carbon RN, BSN, [facility] Liaison (in [place]) [phone]

## 2018-11-22 NOTE — TOC Initial Note (Signed)
Transition of Care [facility]) - Initial/Assessment Note    Patient Details  Name: Anthony Hamilton MRN: [id number] Date of Birth: 22-Jan-1928  Transition of Care [facility]) CM/SW Contact:    Geralynn Ochs, LCSW Phone Number: 11/22/2018, 11:39 AM  Clinical Narrative:    CSW acknowledging consult for hospice placement. CSW contacted patient's daughter, [name] [phone]) to discuss placement options. Daughter prefers [facility] as that will be closer for family. CSW contacted Anderson Malta with [facility] to discuss referral. No bed available today. CSW to continue to follow.               Expected Discharge Plan: [place] Barriers to Discharge: Continued Medical Work up   Patient Goals and CMS Choice Patient states their goals for this hospitalization and ongoing recovery are:: patient's family wants the patient to remain comfortable CMS Medicare.gov Compare Post Acute Care list provided to:: Patient Represenative (must comment) Choice offered to / list presented to : Adult Children  Expected Discharge Plan and Services Expected Discharge Plan: [place]       Living arrangements for the past 2 months: Single Family Home, [place]                                      Prior Living Arrangements/Services Living arrangements for the past 2 months: [place], [place]   Patient language and need for interpreter reviewed:: No Do you feel safe going back to the place where you live?: Yes      Need for Family Participation in Patient Care: Yes (Comment) Care giver support system in place?: No (comment)   Criminal Activity/Legal Involvement Pertinent to Current Situation/Hospitalization: No - Comment as needed  Activities of Daily Living      Permission Sought/Granted Permission sought to share information with : Facility Sport and exercise psychologist, Family Supports Permission granted to share  information with : Yes, Verbal Permission Granted  Share Information with NAME: [name]  Permission granted to share info w AGENCY: [place] granted to share info w Relationship: Daughter  Permission granted to share info w Contact Information: [phone]  Emotional Assessment   Attitude/Demeanor/Rapport: Unable to Assess Affect (typically observed): Unable to Assess   Alcohol / Substance Use: Not Applicable Psych Involvement: No (comment)  Admission diagnosis:  Metabolic encephalopathy [G25.42] Hypernatremia [E87.0] AKI (acute kidney injury) ([place]) [N17.9] ARF (acute renal failure) ([place]) [N17.9] Patient Active Problem List   Diagnosis Date Noted  . Pressure injury of skin 11/22/2018  . Dementia associated with other underlying disease without behavioral disturbance ([place])   . Palliative care encounter   . ARF (acute renal failure) ([place]) 11/19/2018  . Acute encephalopathy 11/19/2018  . CKD (chronic kidney disease) stage 3, GFR 30-59 ml/min (HCC) 11/18/2018  . Hypernatremia 11/18/2018  . Essential hypertension 11/08/2018  . Hip fracture, unspecified laterality, closed, initial encounter ([place]) 11/08/2018  . Cognitive decline 11/08/2018  . Hip fracture ([place]) 11/08/2018   PCP:  Seward Carol, MD Pharmacy:  No Pharmacies Listed    Social Determinants of Health (SDOH) Interventions    Readmission Risk Interventions No flowsheet data found.

## 2018-11-22 NOTE — Progress Notes (Signed)
Daily Progress Note   Patient Name: Anthony Hamilton       Date: 11/22/2018 DOB: [birth year]  Age: 85 y.o. MRN#: [id number] Attending Physician: Nita Sells, MD Primary Care Physician: Seward Carol, MD Admit Date: 11/18/2018  Reason for Consultation/Follow-up: Establishing goals of care and Psychosocial/spiritual support  Subjective: Spoke with bedside RN at 6:30 am this morning as well as mid day.  Patient has had some periods of agitation but responds well to medication and is comfortable.  He has required some PRN dilaudid and haldol in addition to his low dose scheduled dilaudid.  I spoke with his wife.  She understands he is near end of life and is sad but accepting.  She would like for him to go to hospice if possible.  I explained that a hospice bed is not available today.  I attempted to prepare her for the possibility that if he declines very rapidly he may not leave the hospital.  She understood and requested the ability to visit her husband.   Assessment: 34 yo severely deconditioned male, recent hip fracture, altered mental status.  Renal failure.  Unable to take in oral hydration or nutrition.  He has pulled out his IVs as well.   Patient Profile/HPI:  85 y.o. male  with past medical history of cognitive decline and chronic kidney disease as well as a recent hip fracture s/p left hemi arthroplasty (6/14),  who was admitted on 11/18/2018 with confusion and poor PO intake.  Work up revealed acute on chronic kidney failure, hypernatremia and hypotension.  His creatinine is 4.7 up from 1.8 on discharge 3 days ago.  The patient also had hematuria.  Imaging reveals greater than 10 cysts on each kidney and a 2 cm cystic density on his pancreas.  He is currently oliguric.  Per  Epic he has had no PO intake since admission.     Length of Stay: 3  Current Medications: Scheduled Meds:  . chlorhexidine  15 mL Mouth Rinse BID  . HYDROmorphone HCl  1 mg Oral Q8H  . mouth rinse  15 mL Mouth Rinse q12n4p  . OLANZapine zydis  2.5 mg Oral QHS    Continuous Infusions:   PRN Meds: acetaminophen **OR** acetaminophen, antiseptic oral rinse, glycopyrrolate **OR** glycopyrrolate **OR** glycopyrrolate, [DISCONTINUED] haloperidol **OR** haloperidol **OR** haloperidol lactate, HYDROmorphone  HCl, ondansetron **OR** ondansetron (ZOFRAN) IV, polyvinyl alcohol    Vital Signs: BP 134/76 (BP Location: Right Arm)   Pulse 88   Temp 98.4 F (36.9 C) (Oral)   Resp 18   Ht 5\' 7"  (1.702 m)   Wt 64.2 kg   SpO2 100%   BMI 22.17 kg/m  SpO2: SpO2: 100 % O2 Device: O2 Device: Nasal Cannula O2 Flow Rate: O2 Flow Rate (L/min): 2 L/min  Intake/output summary:   Intake/Output Summary (Last 24 hours) at 11/22/2018 1317 Last data filed at 11/22/2018 0735 Gross per 24 hour  Intake 672.12 ml  Output 1050 ml  Net -377.88 ml   LBM: Last BM Date: 11/19/18 Baseline Weight: Weight: 68 kg Most recent weight: Weight: 64.2 kg       Palliative Assessment/Data: 10%      Patient Active Problem List   Diagnosis Date Noted  . Pressure injury of skin 11/22/2018  . Dementia associated with other underlying disease without behavioral disturbance ([place])   . Palliative care encounter   . ARF (acute renal failure) ([place]) 11/19/2018  . Acute encephalopathy 11/19/2018  . CKD (chronic kidney disease) stage 3, GFR 30-59 ml/min (HCC) 11/18/2018  . Hypernatremia 11/18/2018  . Essential hypertension 11/08/2018  . Hip fracture, unspecified laterality, closed, initial encounter ([place]) 11/08/2018  . Cognitive decline 11/08/2018  . Hip fracture ([place]) 11/08/2018    Palliative Care Plan    Recommendations/Plan:  Continue current plan of care.  Patient receiving scheduled an PRN dilaudid as  well as haldol PRN for pain and agitation.    Bed at [facility] requested.  Goals of Care and Additional Recommendations:  Limitations on Scope of Treatment: Full Comfort Care  Code Status:  DNR  Prognosis:   Hours - Days.  I am concerned the patient could decline very rapidly    Discharge Planning:  Hospice facility v hospital death  Care plan was discussed with wife, RN Sam  Thank you for allowing the Palliative Medicine Team to assist in the care of this patient.  Total time spent:  25 min.     Greater than 50%  of this time was spent counseling and coordinating care related to the above assessment and plan.  Florentina Jenny, PA-C Palliative Medicine  Please contact Palliative MedicineTeam phone at [phone] for questions and concerns between 7 am - 7 pm.   Please see AMION for individual provider pager numbers.

## 2018-11-23 MED ORDER — HALOPERIDOL LACTATE 2 MG/ML PO CONC: 1.0000 mg | ORAL | 0 refills | Status: AC | PRN

## 2018-11-23 MED ORDER — HYDROMORPHONE HCL 1 MG/ML PO LIQD: 1.0000 mg | Freq: Three times a day (TID) | ORAL | 0 refills | Status: AC

## 2018-11-23 MED ORDER — OLANZAPINE 5 MG PO TBDP: 2.5000 mg | ORAL_TABLET | Freq: Every day | ORAL | Status: AC

## 2018-11-23 MED ORDER — GLYCOPYRROLATE 1 MG PO TABS: 1.0000 mg | ORAL_TABLET | ORAL | Status: AC | PRN

## 2018-11-23 NOTE — TOC Transition Note (Signed)
Transition of Care [facility]) - CM/SW Discharge Note   Patient Details  Name: Anthony Hamilton MRN: [id number] Date of Birth: [birth year]  Transition of Care [facility]) CM/SW Contact:  Geralynn Ochs, LCSW Phone Number: 11/23/2018, 12:03 PM   Clinical Narrative:   Nurse to call report to [phone]    Final next level of care: [place] Barriers to Discharge: Barriers Resolved   Patient Goals and CMS Choice Patient states their goals for this hospitalization and ongoing recovery are:: patient's family wants the patient to remain comfortable CMS Medicare.gov Compare Post Acute Care list provided to:: Patient Represenative (must comment) Choice offered to / list presented to : Adult Children  Discharge Placement              Patient chooses bed at: [facility]) Patient to be transferred to facility by: [facility] Name of family member notified: [name] Patient and family notified of of transfer: 11/23/18  Discharge Plan and Services                                     Social Determinants of Health (SDOH) Interventions     Readmission Risk Interventions No flowsheet data found.

## 2018-11-23 NOTE — Progress Notes (Signed)
Nsg Discharge Note  Admit Date:  11/18/2018 Discharge date: 11/23/2018   Kennyth Arnold Gowens to be D/C'd Bing Ree per MD order.  AVS completed.  Copy for chart, and copy for patient signed, and dated. Patient/caregiver able to verbalize understanding.  Discharge Medication: Allergies as of 11/23/2018      Reactions   Ibuprofen Other (See Comments)   unknown      Medication List    STOP taking these medications   aspirin EC 81 MG tablet   docusate 50 MG/5ML liquid Commonly known as: COLACE   enoxaparin 40 MG/0.4ML injection Commonly known as: LOVENOX   feeding supplement (ENSURE ENLIVE) Liqd   ondansetron 4 MG tablet Commonly known as: ZOFRAN   oxyCODONE 5 MG immediate release tablet Commonly known as: Oxy IR/ROXICODONE   QUEtiapine 25 MG tablet Commonly known as: SEROQUEL     TAKE these medications   acetaminophen 500 MG tablet Commonly known as: TYLENOL Take 2 tablets (1,000 mg total) by mouth every 8 (eight) hours for 14 days.   glycopyrrolate 1 MG tablet Commonly known as: ROBINUL Take 1 tablet (1 mg total) by mouth every 4 (four) hours as needed (excessive secretions).   haloperidol 2 MG/ML solution Commonly known as: HALDOL Place 0.5 mLs (1 mg total) under the tongue every 4 (four) hours as needed for agitation (or delirium).   HYDROmorphone HCl 1 MG/ML Liqd Commonly known as: DILAUDID Take 1 mL (1 mg total) by mouth every 8 (eight) hours.   multivitamin with minerals Tabs tablet Take 1 tablet by mouth daily.   OLANZapine zydis 5 MG disintegrating tablet Commonly known as: ZYPREXA Take 0.5 tablets (2.5 mg total) by mouth at bedtime.       Discharge Assessment: Vitals:   11/23/18 0054 11/23/18 0452  BP: 132/83 (!) 127/91  Pulse: (!) 105 77  Resp:  20  Temp:  97.7 F (36.5 C)  SpO2:  90%   Skin clean, dry and intact without evidence of skin break down, no evidence of skin tears noted. IV catheter discontinued intact. Site without signs  and symptoms of complications - no redness or edema noted at insertion site, patient denies c/o pain - only slight tenderness at site.  Dressing with slight pressure applied.  Called report to [employer]. Transport given AVS.   Tresa Endo, RN 11/23/2018 12:39 PM

## 2018-11-23 NOTE — Progress Notes (Signed)
Manufacturing engineer [facility])  [facility] will have a bed today for Mr. Lease.  Necessary forms completed and [facility] is ready for Mr. Vanderburg.  RN staff:  Please call report at any time to [phone], bed assignment will be determined then.  Please leave indwelling foley (if applicable), you may remove any peripheral IVs.    Please fax d/c summary to:  [phone]  Family and LCSW updated.  Thank you, Venia Carbon RN, BSN, [facility] Liaison (in [place]) [phone]

## 2018-11-23 NOTE — Discharge Summary (Signed)
Physician Discharge Summary  Anthony Hamilton YDX:[id number] DOB: Oct 01, 1927 DOA: 11/18/2018  PCP: Seward Carol, MD  Admit date: 11/18/2018 Discharge date: 11/23/2018  Time spent: 25 minutes  Recommendations for Outpatient Follow-up:  1. Going to [facility] for end-of-life care 2. Expected demise within the next 3 to 14 days  Discharge Diagnoses:  Principal Problem:   ARF (acute renal failure) ([place]) Active Problems:   Essential hypertension   Hypernatremia   Acute encephalopathy   Palliative care encounter   Dementia associated with other underlying disease without behavioral disturbance ([place])   Pressure injury of skin   Discharge Condition: Guarded  Diet recommendation: Comfort  Filed Weights   11/18/18 2353 11/19/18 1746 11/20/18 0511  Weight: 68 kg 63.6 kg 64.2 kg    History of present illness:  85 year old ? Dementia hip fracture admitted 6/24 with confusion significant  in the setting of new AKI, hypovolemic hypernatremia 160s hematuria and leukocytosis He was seen by nephrology and palliative care and ultimately family decided based on likely terminal trajectory that he would be best served per his prior wishes in terms of DNR/DNI and placement at freestanding hospice facility We currently await bed placement-I saw him this morning he is stable he is a little agitated but overall would be a good candidate for freestanding hospice placement   Discharge Exam: Vitals:   11/23/18 0054 11/23/18 0452  BP: 132/83 (!) 127/91  Pulse: (!) 105 77  Resp:  20  Temp:  97.7 F (36.5 C)  SpO2:  90%    General: Incoherent mumbling on oxygen external ocular movements intact dry mucosa Neck soft supple Cardiovascular: S1-S2 tachycardic Respiratory: In axilla no rales no rhonchi No lower extremity edema Sacrum not examined   Discharge Instructions   Discharge Instructions    Diet - low sodium heart healthy   Complete by: As directed    Discharge instructions    Complete by: As directed    Going to [facility]   Increase activity slowly   Complete by: As directed      Allergies as of 11/23/2018      Reactions   Ibuprofen Other (See Comments)   unknown      Medication List    STOP taking these medications   aspirin EC 81 MG tablet   docusate 50 MG/5ML liquid Commonly known as: COLACE   enoxaparin 40 MG/0.4ML injection Commonly known as: LOVENOX   feeding supplement (ENSURE ENLIVE) Liqd   ondansetron 4 MG tablet Commonly known as: ZOFRAN   oxyCODONE 5 MG immediate release tablet Commonly known as: Oxy IR/ROXICODONE   QUEtiapine 25 MG tablet Commonly known as: SEROQUEL     TAKE these medications   acetaminophen 500 MG tablet Commonly known as: TYLENOL Take 2 tablets (1,000 mg total) by mouth every 8 (eight) hours for 14 days.   glycopyrrolate 1 MG tablet Commonly known as: ROBINUL Take 1 tablet (1 mg total) by mouth every 4 (four) hours as needed (excessive secretions).   haloperidol 2 MG/ML solution Commonly known as: HALDOL Place 0.5 mLs (1 mg total) under the tongue every 4 (four) hours as needed for agitation (or delirium).   HYDROmorphone HCl 1 MG/ML Liqd Commonly known as: DILAUDID Take 1 mL (1 mg total) by mouth every 8 (eight) hours.   multivitamin with minerals Tabs tablet Take 1 tablet by mouth daily.   OLANZapine zydis 5 MG disintegrating tablet Commonly known as: ZYPREXA Take 0.5 tablets (2.5 mg total) by mouth at bedtime.  Allergies  Allergen Reactions  . Ibuprofen Other (See Comments)    unknown      The results of significant diagnostics from this hospitalization (including imaging, microbiology, ancillary and laboratory) are listed below for reference.    Significant Diagnostic Studies: Dg Chest 1 View  Result Date: 11/08/2018 CLINICAL DATA:  Fall today, left hip fracture. EXAM: CHEST  1 VIEW COMPARISON:  12/02/2006 FINDINGS: Mild enlargement of the cardiopericardial silhouette.  Atherosclerotic calcification of the aortic arch. The patient is rotated to the left on today's radiograph, reducing diagnostic sensitivity and specificity. The lungs appear clear. No blunting of the costophrenic angles. Dextroconvex thoracic scoliosis. IMPRESSION: 1. Mild enlargement of the cardiopericardial silhouette, without edema. 2.  Aortic Atherosclerosis (ICD10-I70.0). Electronically Signed   By: Van Clines M.D.   On: 11/08/2018 12:15   Dg Chest 2 View  Result Date: 11/19/2018 CLINICAL DATA:  Sepsis EXAM: CHEST - 2 VIEW COMPARISON:  November 15, 2018 FINDINGS: There is no large infiltrate. The heart size is stable. There is no pneumothorax. No acute osseous abnormality. The lungs are essentially clear. The previously noted left basilar airspace opacity is no longer well visualized. IMPRESSION: No active cardiopulmonary disease. Electronically Signed   By: Constance Holster M.D.   On: 11/19/2018 01:30   Ct Head Wo Contrast  Result Date: 11/19/2018 CLINICAL DATA:  Altered level of consciousness. EXAM: CT HEAD WITHOUT CONTRAST TECHNIQUE: Contiguous axial images were obtained from the base of the skull through the vertex without intravenous contrast. COMPARISON:  None. FINDINGS: Brain: No evidence of acute infarction, hemorrhage, hydrocephalus, extra-axial collection or mass lesion/mass effect. Volume loss is noted. Chronic microvascular ischemic changes are noted. Evaluation was somewhat limited by motion artifact. Vascular: No hyperdense vessel or unexpected calcification. Skull: Normal. Negative for fracture or focal lesion. Sinuses/Orbits: No acute finding. Other: None. IMPRESSION: 1. No acute intracranial abnormality. 2. Volume loss and chronic microvascular ischemic changes are noted. Electronically Signed   By: Constance Holster M.D.   On: 11/19/2018 03:51   US Renal  Result Date: 11/19/2018 CLINICAL DATA:  Acute kidney injury. EXAM: RENAL / URINARY TRACT ULTRASOUND COMPLETE COMPARISON:   Ultrasound of April 18, 2010. CT scan of November 19, 2018. FINDINGS: Right Kidney: Renal measurements: 19.6 x 10.5 x 8.4 cm = volume: None 107 mL. Increased echogenicity of renal parenchyma is noted suggesting medical renal disease. Greater than 10 cysts are noted in the right kidney, with the largest simple cyst measuring 10.7 cm in the lower pole. 7.9 cm cyst is seen in upper pole. No definite hydronephrosis is noted. Left Kidney: Renal measurements: 12.7 x 5.8 x 4.3 cm = volume: 164 mL. Increased echogenicity of renal parenchyma is noted suggesting medical renal disease. Greater than 10 cysts are noted, with the largest measuring 4.2 cm in lower pole. No hydronephrosis is noted. Bladder: Bladder diverticulum is noted. Slightly irregular wall of urinary bladder is noted suggesting trabeculation. Large prostate gland is noted. IMPRESSION: Increased echogenicity of renal parenchyma is noted bilaterally suggesting medical renal disease. Bilateral renal cysts are noted as described above. Bladder diverticulum is noted.  Large prostate gland is noted. Electronically Signed   By: Marijo Conception M.D.   On: 11/19/2018 15:19   Pelvis Portable  Result Date: 11/09/2018 CLINICAL DATA:  Post LEFT hip surgery EXAM: PORTABLE PELVIS 1-2 VIEWS COMPARISON:  Portable exam 1117 hours compared to 11/08/2018 FINDINGS: Interval resection of LEFT femoral head and placement of a LEFT femoral prosthesis. No dislocation. Patient's LEFT hand is  superimposed with the proximal femur. No definite acute femoral abnormalities identified. Pelvis appears intact. IMPRESSION: LEFT hip prosthesis without definite acute abnormalities. Electronically Signed   By: Lavonia Dana M.D.   On: 11/09/2018 13:35   Dg Chest Port 1 View  Result Date: 11/15/2018 CLINICAL DATA:  Pneumonia.  Recent fall. EXAM: PORTABLE CHEST 1 VIEW COMPARISON:  11/12/2018 FINDINGS: Lungs are adequately inflated with improved left base opacification with only subtle left  retrocardiac density. No effusion. Cardiomediastinal silhouette and remainder of the exam is unchanged. No fracture. IMPRESSION: Improving left base opacification likely improving infection. Electronically Signed   By: Marin Olp M.D.   On: 11/15/2018 16:56   Dg Chest Port 1 View  Result Date: 11/12/2018 CLINICAL DATA:  Pneumonia EXAM: PORTABLE CHEST 1 VIEW COMPARISON:  11/08/2018, 12/02/2006 FINDINGS: Probable small left effusion. Mild airspace disease at the left base. Mild cardiomegaly with aortic atherosclerosis. No pneumothorax. Small sclerotic foci in the right humerus and right scapula. IMPRESSION: Probable trace left effusion. Airspace disease at the left lung base may reflect atelectasis or a pneumonia Electronically Signed   By: Donavan Foil M.D.   On: 11/12/2018 22:15   Ct Renal Stone Study  Result Date: 11/19/2018 CLINICAL DATA:  Hematuria with unknown cause EXAM: CT ABDOMEN AND PELVIS WITHOUT CONTRAST TECHNIQUE: Multidetector CT imaging of the abdomen and pelvis was performed following the standard protocol without IV contrast. COMPARISON:  05/15/2007 FINDINGS: Lower chest:  Mild dependent atelectasis.  Coronary calcification Hepatobiliary: No focal liver abnormality.Cholecystectomy which likely accounts for prominent bile duct dimensions. Pancreas: Generalized fatty atrophy. 2 cm cystic density along the upper pancreas that is new from prior. Spleen: Unremarkable. Adrenals/Urinary Tract: Small bilateral nodules that are stable and attributed adenoma . Multiple bilateral renal cysts including a lobulated posterior interpolar cyst on the right with thin benign-appearing septations that are stable. A large right lower pole cyst measures 11 cm. Accounting for hilar cysts there is no hydronephrosis. Degree of bilateral renal atrophy. Distended bladder with diverticula at the apex. Stomach/Bowel:  No obstruction. No evidence of bowel inflammation. Vascular/Lymphatic: Atherosclerotic  calcification. Mesenteric stranding in 2008 has resolved, now with small calcified nodes in this area. Reproductive:Marked thickening of the prostate. Other: Left inguinal hernia containing non thickened and nonobstructed sigmoid colon Musculoskeletal: Fluid and bubble of gas about the left hip arthroplasty correlating with recent placement. No acute osseous finding IMPRESSION: 1. No acute finding or specific explanation for hematuria. 2. Multiple renal cysts with benign mural calcification on the right. 3. Marked enlargement of the prostate with bladder wall thickening and diverticula attributed to chronic outlet obstruction. 4. 2 cm cystic density nodule along the upper pancreas that is new from 2008. MR follow-up could be obtained if appropriate for life expectancy. 5. Left inguinal hernia containing sigmoid colon. Electronically Signed   By: Monte Fantasia M.D.   On: 11/19/2018 06:09   Dg Hip Unilat With Pelvis 2-3 Views Left  Result Date: 11/08/2018 CLINICAL DATA:  Left hip pain, fall EXAM: DG HIP (WITH OR WITHOUT PELVIS) 2-3V LEFT COMPARISON:  None. FINDINGS: There is an acute angulated displaced left hip subcapital femoral neck fracture. No associated hip dislocation. Diffuse soft tissue swelling. Bony pelvis and right hip intact. IMPRESSION: Acute displaced left hip subcapital femoral neck fracture. Electronically Signed   By: Jerilynn Mages.  Shick M.D.   On: 11/08/2018 12:16    Microbiology: Recent Results (from the past 240 hour(s))  Culture, blood (Routine x 2)     Status: None (Preliminary  result)   Collection Time: 11/19/18 12:40 AM   Specimen: BLOOD  Result Value Ref Range Status   Specimen Description BLOOD LEFT ANTECUBITAL  Final   Special Requests   Final    BOTTLES DRAWN AEROBIC AND ANAEROBIC Blood Culture adequate volume   Culture   Final    NO GROWTH 4 DAYS Performed at [facility], [address] [place], [place] [address]    Report Status PENDING  Incomplete  Culture, blood  (Routine x 2)     Status: None (Preliminary result)   Collection Time: 11/19/18 12:44 AM   Specimen: BLOOD RIGHT FOREARM  Result Value Ref Range Status   Specimen Description BLOOD RIGHT FOREARM  Final   Special Requests   Final    BOTTLES DRAWN AEROBIC AND ANAEROBIC Blood Culture results may not be optimal due to an inadequate volume of blood received in culture bottles   Culture   Final    NO GROWTH 4 DAYS Performed at [facility], [place] [address], [place],  [address]    Report Status PENDING  Incomplete  SARS Coronavirus 2 (CEPHEID - Performed in [facility] lab), Hosp Order     Status: None   Collection Time: 11/19/18  1:13 AM   Specimen: Nasopharyngeal Swab  Result Value Ref Range Status   SARS Coronavirus 2 NEGATIVE NEGATIVE Final    Comment: (NOTE) If result is NEGATIVE SARS-CoV-2 target nucleic acids are NOT DETECTED. The SARS-CoV-2 RNA is generally detectable in upper and lower  respiratory specimens during the acute phase of infection. The lowest  concentration of SARS-CoV-2 viral copies this assay can detect is 250  copies / mL. A negative result does not preclude SARS-CoV-2 infection  and should not be used as the sole basis for treatment or other  patient management decisions.  A negative result may occur with  improper specimen collection / handling, submission of specimen other  than nasopharyngeal swab, presence of viral mutation(s) within the  areas targeted by this assay, and inadequate number of viral copies  (<250 copies / mL). A negative result must be combined with clinical  observations, patient history, and epidemiological information. If result is POSITIVE SARS-CoV-2 target nucleic acids are DETECTED. The SARS-CoV-2 RNA is generally detectable in upper and lower  respiratory specimens dur ing the acute phase of infection.  Positive  results are indicative of active infection with SARS-CoV-2.  Clinical  correlation with patient  history and other diagnostic information is  necessary to determine patient infection status.  Positive results do  not rule out bacterial infection or co-infection with other viruses. If result is PRESUMPTIVE POSTIVE SARS-CoV-2 nucleic acids MAY BE PRESENT.   A presumptive positive result was obtained on the submitted specimen  and confirmed on repeat testing.  While 2019 novel coronavirus  (SARS-CoV-2) nucleic acids may be present in the submitted sample  additional confirmatory testing may be necessary for epidemiological  and / or clinical management purposes  to differentiate between  SARS-CoV-2 and other Sarbecovirus currently known to infect humans.  If clinically indicated additional testing with an alternate test  methodology [phone]) is advised. The SARS-CoV-2 RNA is generally  detectable in upper and lower respiratory sp ecimens during the acute  phase of infection. The expected result is Negative. Fact Sheet for Patients:  StrictlyIdeas.no Fact Sheet for Healthcare Providers: BankingDealers.co.za This test is not yet approved or cleared by the [place] FDA and has been authorized for detection and/or diagnosis  of SARS-CoV-2 by FDA under an Emergency Use Authorization (EUA).  This EUA will remain in effect (meaning this test can be used) for the duration of the COVID-19 declaration under Section 564(b)(1) of the Act, 21 U.S.C. section 360bbb-3(b)(1), unless the authorization is terminated or revoked sooner. Performed at [facility], [place] [address], [place], [place] [address]      Labs: Basic Metabolic Panel: Recent Labs  Lab 11/19/18 1217 11/19/18 1626 11/20/18 0950 11/20/18 1811 11/21/18 0252  NA 161* 160* 160* 160* 160*  K 3.7 3.7 3.7 3.7 3.3*  CL 129* 129* 130* >130* >130*  CO2 21* 20* 21* 18* 21*  GLUCOSE 173* 142* 114* 181* 52*  BUN 122* 118* 111* 105* 97*  CREATININE 4.49* 4.46* 4.04* 3.76*  3.44*  CALCIUM 7.8* 8.0* 7.9* 7.9* 8.1*   Liver Function Tests: Recent Labs  Lab 11/19/18 0007  AST 24  ALT 30  ALKPHOS 57  BILITOT 1.4*  PROT 5.6*  ALBUMIN 3.0*   No results for input(s): LIPASE, AMYLASE in the last 168 hours. No results for input(s): AMMONIA in the last 168 hours. CBC: Recent Labs  Lab 11/19/18 0007 11/19/18 0843 11/20/18 0950 11/21/18 0252  WBC 30.4* 29.7* 19.0* 17.6*  NEUTROABS 28.0*  --   --   --   HGB 12.2* 10.9* 10.2* 10.0*  HCT 39.6 35.7* 33.7* 32.2*  MCV 96.6 98.1 96.3 94.2  PLT 318 259 229 225   Cardiac Enzymes: No results for input(s): CKTOTAL, CKMB, CKMBINDEX, TROPONINI in the last 168 hours. BNP: BNP (last 3 results) No results for input(s): BNP in the last 8760 hours.  ProBNP (last 3 results) No results for input(s): PROBNP in the last 8760 hours.  CBG: Recent Labs  Lab 11/21/18 0017 11/21/18 0323 11/21/18 0438 11/21/18 0746 11/21/18 1215  GLUCAP 149* 52* 172* 152* 79       Signed:  Nita Sells MD   [facility] 11/23/2018, 9:24 AM

## 2018-11-24 LAB — CULTURE, BLOOD (ROUTINE X 2)
Culture: NO GROWTH
Culture: NO GROWTH
Special Requests: ADEQUATE

## 2018-11-26 DEATH — deceased

## 2020-03-05 IMAGING — US US RENAL
1 series · 14 of 25 positions shown · non-contrast
Comparison: Ultrasound April 18, 2010. CT scan November 19, 2018.

CLINICAL DATA: Acute kidney injury.

EXAM:
RENAL / URINARY TRACT ULTRASOUND COMPLETE

[Series 1: us renal · 14 of 66 slices shown]
[im 1/66]
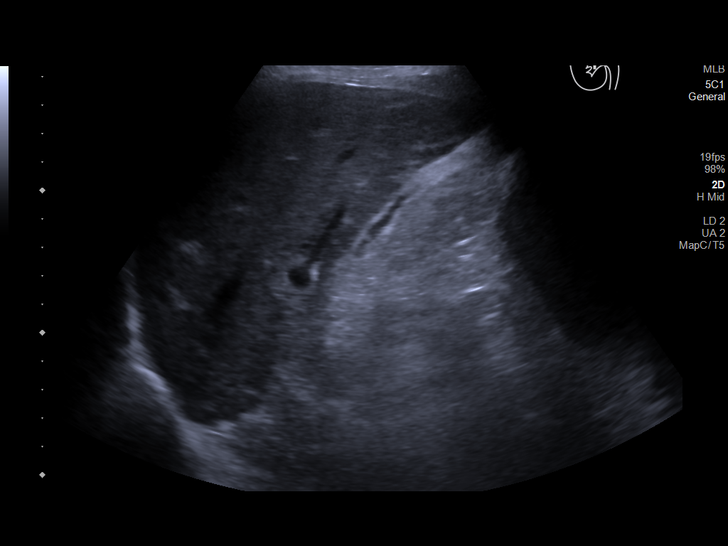
[im 6/66]
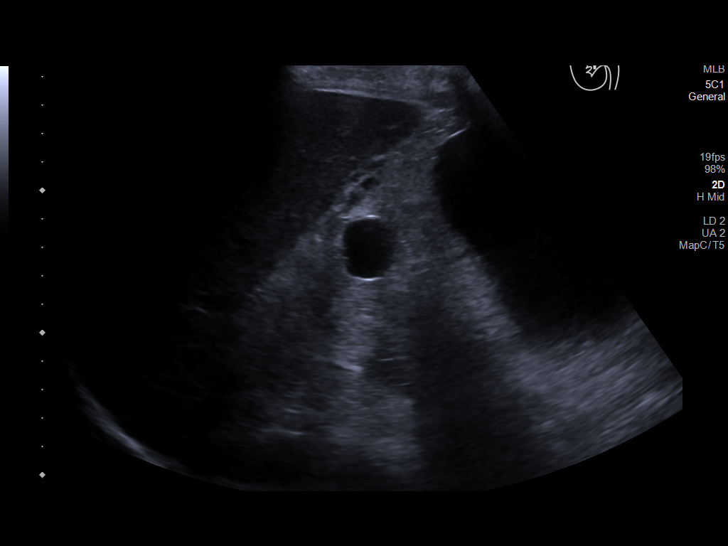
[im 11/66]
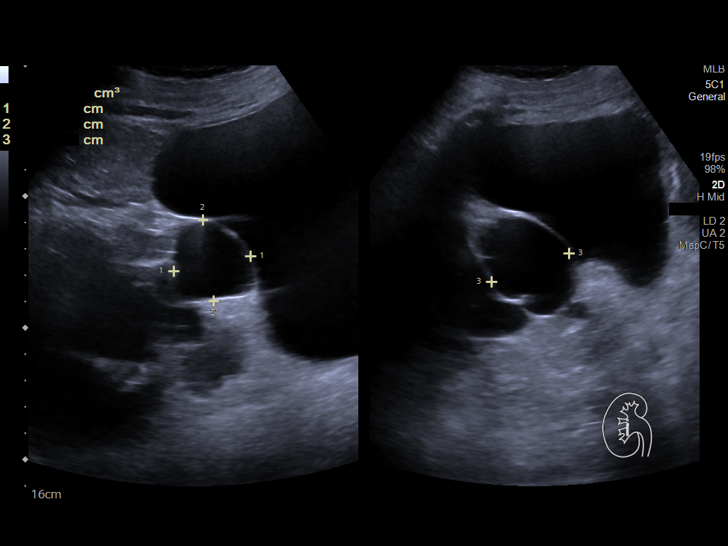
[im 17/66]
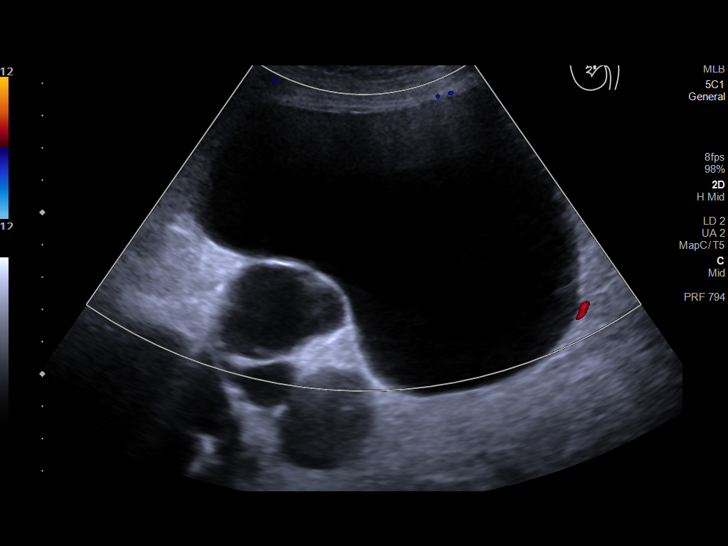
[im 22/66]
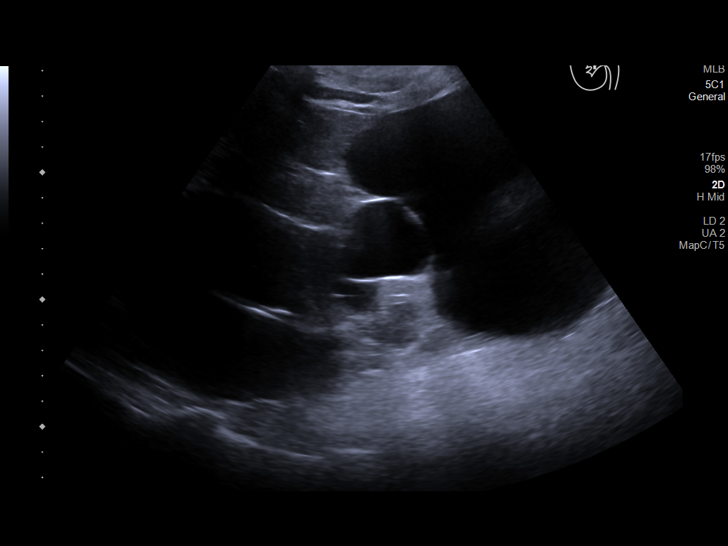
[im 25/66]
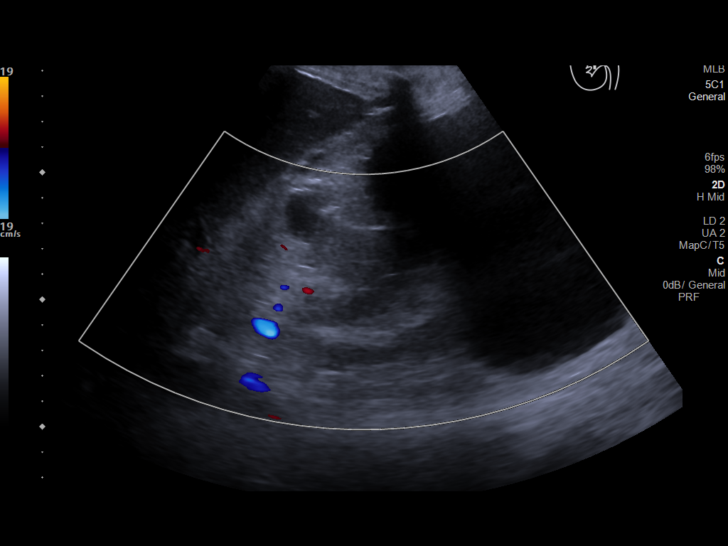
[im 30/66]
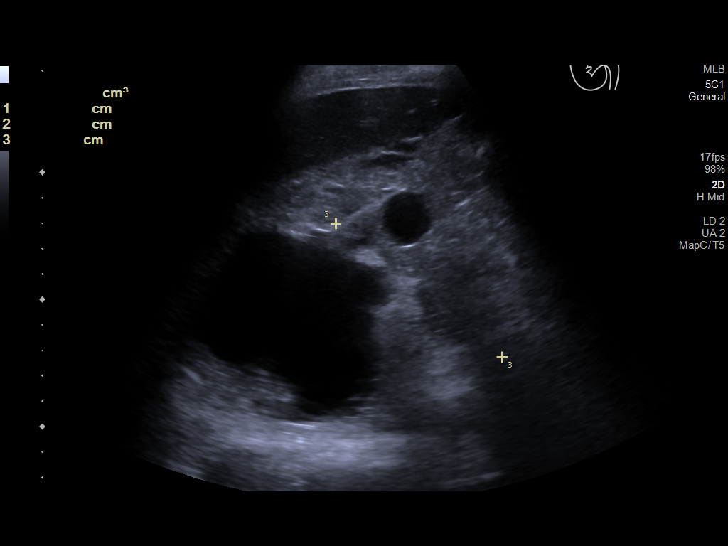
[im 36/66]
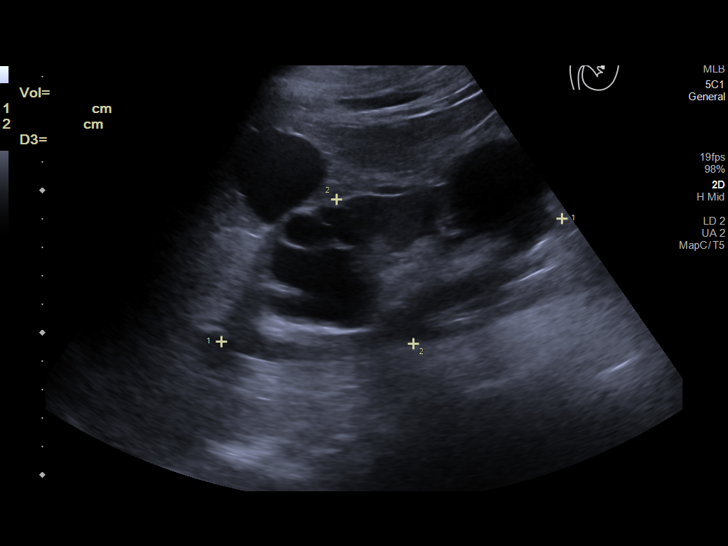
[im 41/66]
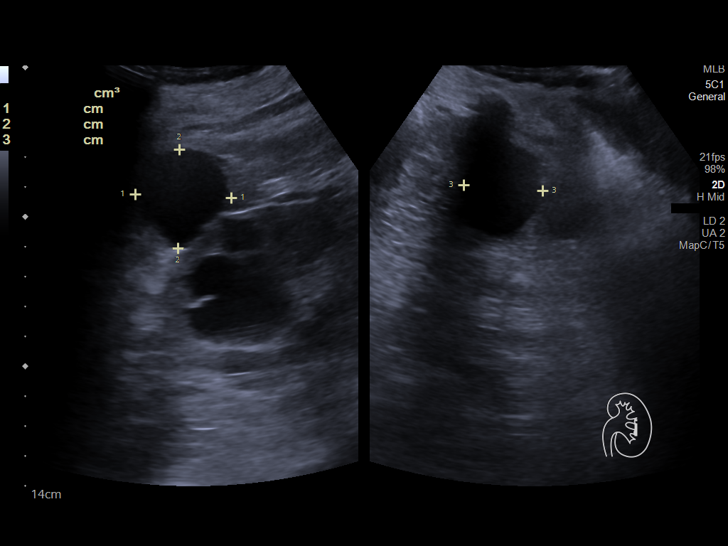
[im 44/66]
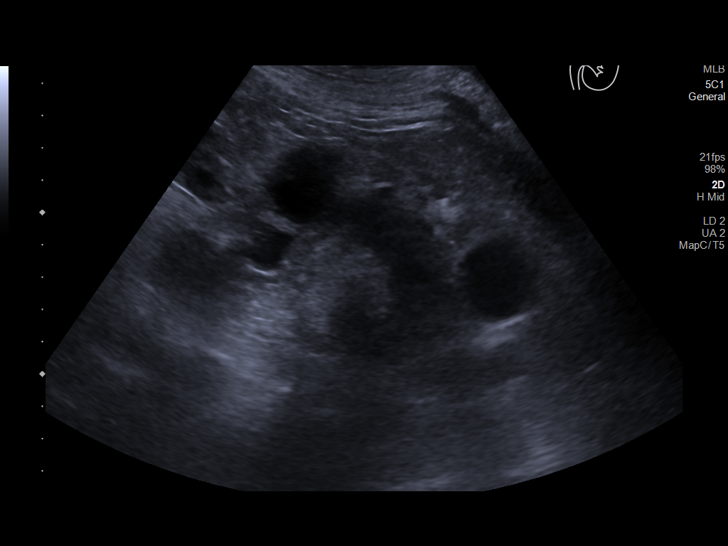
[im 49/66]
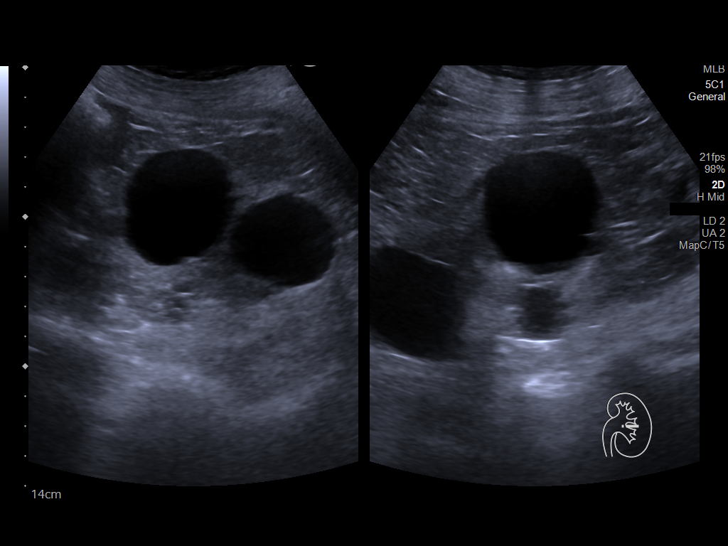
[im 55/66]
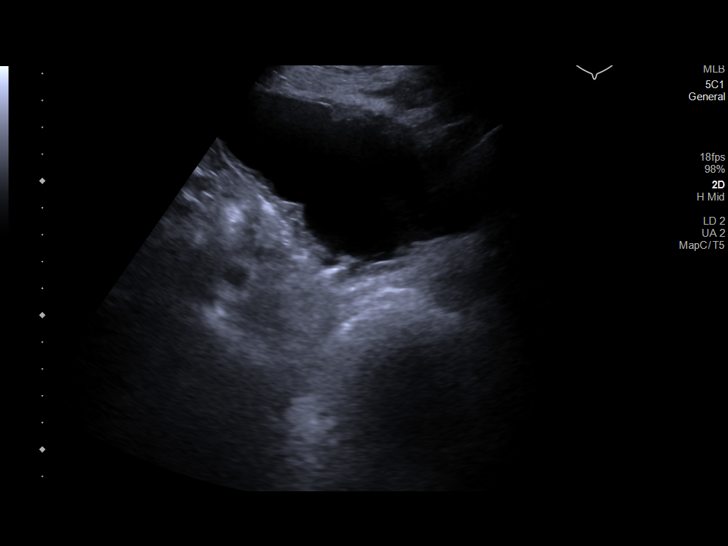
[im 60/66]
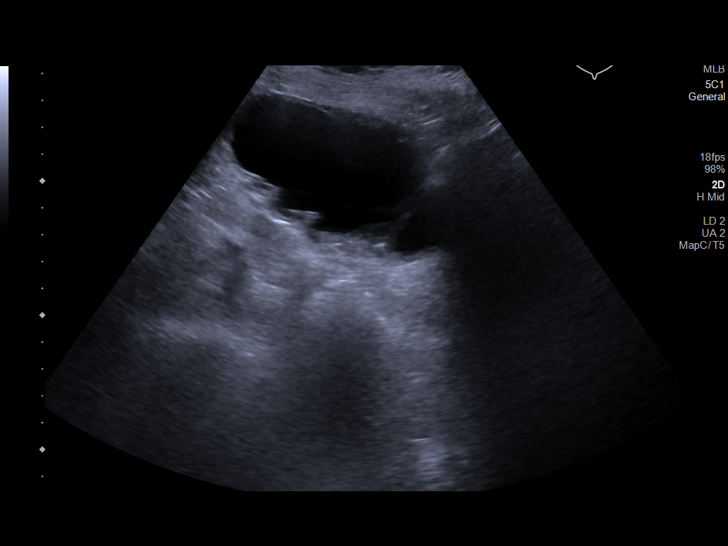
[im 66/66]
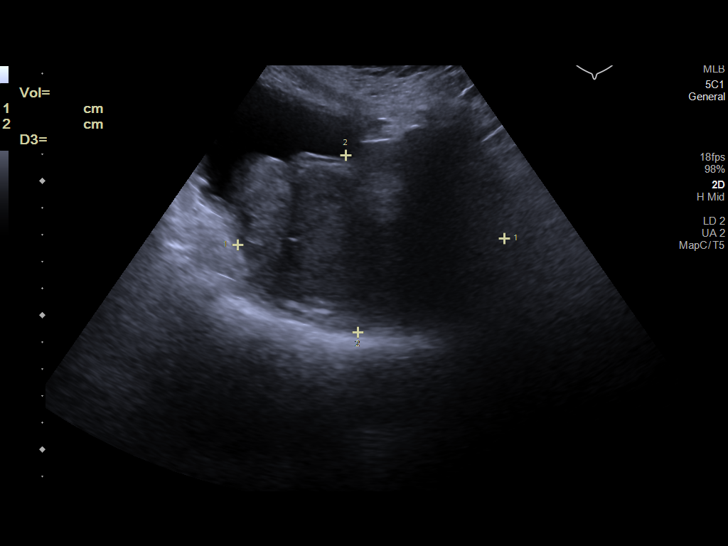

[14 of 25 positions shown; findings below may reference images not displayed]

FINDINGS: Right Kidney:

Renal measurements: 19.6 x 10.5 x 8.4 cm = volume: None 107 mL.
Increased echogenicity of renal parenchyma is noted suggesting
medical renal disease. Greater than 10 cysts are noted in the right
kidney, with the largest simple cyst measuring 10.7 cm in the lower
pole. 7.9 cm cyst is seen in upper pole. No definite hydronephrosis
is noted.

Left Kidney:

Renal measurements: 12.7 x 5.8 x 4.3 cm = volume: 164 mL. Increased
echogenicity of renal parenchyma is noted suggesting medical renal
disease. Greater than 10 cysts are noted, with the largest measuring
4.2 cm in lower pole. No hydronephrosis is noted.

Bladder:

Bladder diverticulum is noted. Slightly irregular wall of urinary
bladder is noted suggesting trabeculation. Large prostate gland is
noted.
IMPRESSION: Increased echogenicity of renal parenchyma is noted bilaterally
suggesting medical renal disease. Bilateral renal cysts are noted as
described above.

Bladder diverticulum is noted.  Large prostate gland is noted.
